# Patient Record
Sex: Female | Born: 1950 | Race: Black or African American | Hispanic: No | Marital: Married | Smoking: Never smoker
Health system: Southern US, Community
[De-identification: ages and names within clinical notes are randomized; demographics above are authoritative.]

## PROBLEM LIST (undated history)

## (undated) DIAGNOSIS — E7800 Pure hypercholesterolemia, unspecified: Secondary | ICD-10-CM

## (undated) DIAGNOSIS — D631 Anemia in chronic kidney disease: Secondary | ICD-10-CM

## (undated) DIAGNOSIS — D869 Sarcoidosis, unspecified: Secondary | ICD-10-CM

## (undated) DIAGNOSIS — D509 Iron deficiency anemia, unspecified: Secondary | ICD-10-CM

## (undated) DIAGNOSIS — I10 Essential (primary) hypertension: Secondary | ICD-10-CM

## (undated) DIAGNOSIS — N183 Chronic kidney disease, stage 3 (moderate): Secondary | ICD-10-CM

## (undated) DIAGNOSIS — E119 Type 2 diabetes mellitus without complications: Secondary | ICD-10-CM

## (undated) HISTORY — PX: ABDOMINAL HYSTERECTOMY: SHX81

## (undated) HISTORY — DX: Iron deficiency anemia, unspecified: D50.9

## (undated) HISTORY — DX: Chronic kidney disease, stage 3 (moderate): N18.3

## (undated) HISTORY — DX: Anemia in chronic kidney disease: D63.1

---

## 1997-11-16 ENCOUNTER — Emergency Department (HOSPITAL_COMMUNITY): Admission: EM | Admit: 1997-11-16 | Discharge: 1997-11-16 | Payer: Self-pay | Admitting: Internal Medicine

## 1998-02-25 ENCOUNTER — Ambulatory Visit (HOSPITAL_COMMUNITY): Admission: RE | Admit: 1998-02-25 | Discharge: 1998-02-25 | Payer: Self-pay | Admitting: Internal Medicine

## 1998-02-25 ENCOUNTER — Encounter: Payer: Self-pay | Admitting: Internal Medicine

## 1998-03-07 ENCOUNTER — Ambulatory Visit (HOSPITAL_COMMUNITY): Admission: RE | Admit: 1998-03-07 | Discharge: 1998-03-07 | Payer: Self-pay | Admitting: Internal Medicine

## 1998-03-08 ENCOUNTER — Inpatient Hospital Stay (HOSPITAL_COMMUNITY): Admission: AD | Admit: 1998-03-08 | Discharge: 1998-03-11 | Payer: Self-pay | Admitting: Internal Medicine

## 1998-03-09 ENCOUNTER — Encounter: Payer: Self-pay | Admitting: Internal Medicine

## 1998-03-10 ENCOUNTER — Encounter: Payer: Self-pay | Admitting: General Surgery

## 1998-03-11 ENCOUNTER — Encounter: Payer: Self-pay | Admitting: General Surgery

## 1998-06-30 ENCOUNTER — Other Ambulatory Visit: Admission: RE | Admit: 1998-06-30 | Discharge: 1998-06-30 | Payer: Self-pay | Admitting: Internal Medicine

## 1999-06-21 ENCOUNTER — Other Ambulatory Visit: Admission: RE | Admit: 1999-06-21 | Discharge: 1999-06-21 | Payer: Self-pay | Admitting: Internal Medicine

## 2000-08-23 ENCOUNTER — Other Ambulatory Visit: Admission: RE | Admit: 2000-08-23 | Discharge: 2000-08-23 | Payer: Self-pay | Admitting: Internal Medicine

## 2000-12-13 ENCOUNTER — Other Ambulatory Visit: Admission: RE | Admit: 2000-12-13 | Discharge: 2000-12-13 | Payer: Self-pay | Admitting: *Deleted

## 2000-12-13 ENCOUNTER — Encounter (INDEPENDENT_AMBULATORY_CARE_PROVIDER_SITE_OTHER): Payer: Self-pay | Admitting: Specialist

## 2001-01-03 ENCOUNTER — Ambulatory Visit (HOSPITAL_COMMUNITY): Admission: RE | Admit: 2001-01-03 | Discharge: 2001-01-03 | Payer: Self-pay | Admitting: Gastroenterology

## 2001-01-22 ENCOUNTER — Encounter (INDEPENDENT_AMBULATORY_CARE_PROVIDER_SITE_OTHER): Payer: Self-pay | Admitting: Specialist

## 2001-01-22 ENCOUNTER — Observation Stay (HOSPITAL_COMMUNITY): Admission: RE | Admit: 2001-01-22 | Discharge: 2001-01-23 | Payer: Self-pay | Admitting: *Deleted

## 2003-05-10 ENCOUNTER — Other Ambulatory Visit: Admission: RE | Admit: 2003-05-10 | Discharge: 2003-05-10 | Payer: Self-pay | Admitting: *Deleted

## 2003-08-11 ENCOUNTER — Encounter: Admission: RE | Admit: 2003-08-11 | Discharge: 2003-08-11 | Payer: Self-pay | Admitting: Internal Medicine

## 2007-02-19 ENCOUNTER — Other Ambulatory Visit: Admission: RE | Admit: 2007-02-19 | Discharge: 2007-02-19 | Payer: Self-pay | Admitting: *Deleted

## 2008-10-06 ENCOUNTER — Observation Stay (HOSPITAL_COMMUNITY): Admission: EM | Admit: 2008-10-06 | Discharge: 2008-10-07 | Payer: Self-pay | Admitting: Emergency Medicine

## 2010-07-17 LAB — URINALYSIS, ROUTINE W REFLEX MICROSCOPIC
Bilirubin Urine: NEGATIVE
Glucose, UA: NEGATIVE mg/dL
Hgb urine dipstick: NEGATIVE
Ketones, ur: NEGATIVE mg/dL
Nitrite: NEGATIVE
Protein, ur: NEGATIVE mg/dL
Specific Gravity, Urine: 1.023 (ref 1.005–1.030)
Urobilinogen, UA: 0.2 mg/dL (ref 0.0–1.0)
pH: 6.5 (ref 5.0–8.0)

## 2010-07-17 LAB — POCT I-STAT, CHEM 8
BUN: 14 mg/dL (ref 6–23)
Calcium, Ion: 1.05 mmol/L — ABNORMAL LOW (ref 1.12–1.32)
Chloride: 102 mEq/L (ref 96–112)
Creatinine, Ser: 0.7 mg/dL (ref 0.4–1.2)
Glucose, Bld: 163 mg/dL — ABNORMAL HIGH (ref 70–99)
HCT: 34 % — ABNORMAL LOW (ref 36.0–46.0)
Hemoglobin: 11.6 g/dL — ABNORMAL LOW (ref 12.0–15.0)
Potassium: 3.8 mEq/L (ref 3.5–5.1)
Sodium: 138 mEq/L (ref 135–145)
TCO2: 23 mmol/L (ref 0–100)

## 2010-07-17 LAB — PROTIME-INR
INR: 1.1 (ref 0.00–1.49)
Prothrombin Time: 14.2 seconds (ref 11.6–15.2)

## 2010-07-17 LAB — D-DIMER, QUANTITATIVE: D-Dimer, Quant: 0.55 ug/mL-FEU — ABNORMAL HIGH (ref 0.00–0.48)

## 2010-08-25 NOTE — Discharge Summary (Signed)
[facility]  Patient:    Jeanette Ellis, Jeanette Ellis Visit Number: [id number] MRN: [id number]          Service Type: SUR Location: 4W 0451 01 Attending Physician:  Collene Schlichter Dictated by:   Almedia Balls. Randell Patient, M.D. Admit Date:  01/22/2001 Discharge Date: 01/23/2001                             Discharge Summary  HISTORY OF PRESENT ILLNESS:  The patient is a 60 year old with uterine enlargement secondary to fibroids, abnormal uterine bleeding, anemia secondary to above, who was admitted on January 22, 2001, for abdominal hysterectomy, bilateral salpingo-oophorectomy.  The remainder of her history and physical examination are as previously dictated.  LABORATORY DATA:  Preoperative hemoglobin 11.1, glucose 278.  Chest x-ray was normal.  Electrocardiogram showed nonspecific T wave segment changes.  HOSPITAL COURSE:  The patient was taken to the operating room on January 22, 2001, at which time abdominal supracervical hysterectomy, bilateral salpingo-oophorectomy were performed.  The patient did well postoperatively. She was managed for her glucose by Dr. Lelon Perla, and maintained glucose levels in the approximately mid 180s.  On the morning of January 23, 2001, she was afebrile and experiencing no problems except for pain.  It was felt that she could be discharged at this time.  FINAL DIAGNOSES: 1. Uterine fibroids. 2. Abnormal uterine bleeding. 3. Anemia secondary to above. 4. Bilateral ovarian cysts. 5. Pelvic adhesions.  OPERATIONS:  Abdominal supracervical hysterectomy, bilateral salpingo-oophorectomy, lysis of adhesions.  Pathology report was unavailable at the time of dictation.  DISPOSITION:  Discharged home to return to the office in approximately two weeks for followup.  ACTIVITY:  She was instructed to gradually progress her activities over several weeks at home, and to limit lifting and driving for two weeks.  CONDITION ON DISCHARGE:   She was fully ambulatory, on a regular diet, and in good condition at the time of discharge.  DISCHARGE MEDICATIONS: 1. Mepergan Fortis generic #30 one or two q.4h. p.r.n. pain. 2. Doxycycline 100 mg #12 one b.i.d.  She will call for any problems. Dictated by:   Almedia Balls Randell Patient, M.D. Attending Physician:  Collene Schlichter DD:  01/25/01 TD:  01/27/01 Job: [id number]

## 2010-08-25 NOTE — H&P (Signed)
[facility]  Patient:    Jeanette Ellis, Jeanette Ellis Visit Number: [id number] MRN: [id number]          Service Type: END Location: ENDO Attending Physician:  Charna Elizabeth Dictated by:   Almedia Balls. Randell Patient, M.D. Admit Date:  01/03/2001 Discharge Date: 01/03/2001                           History and Physical  CHIEF COMPLAINT:  Fibroids, abnormal uterine bleeding.  HISTORY:  Patient is a 60 year old gravida 4, para 4 whose last menstrual period was in August of 2002.  She was seen in our office late August on referral from Dr. Anselmo Rod.  Patient was evaluated and found to have uterine enlargement to approximately [redacted] weeks gestational size and under sonogram with findings of this same nature.  She was also found to have an increase in her endometrial stripe and underwent hysteroscopy/D&C on December 13, 2000, with findings of disordered proliferative endometrium and endometrial polyp with simple hyperplasia.  Pap smear had been done earlier in the summer, which was within normal limits.  Patient is admitted at this time for abdominal hysterectomy/bilateral salpingo-oophorectomy.  She has been fully counseled as to the nature of the procedure and the risks involved to include risks of anesthesia, injury to bowel, bladder, blood vessels, ureters, postoperative hemorrhage, infection and recuperation as well as hormone replacement.  She fully understands all the considerations and wishes to proceed on January 22, 2001.  PAST MEDICAL HISTORY:  She has had cholecystectomy in 1999.  She has type 2 diabetes mellitus and is on oral medications with intermittent good control.  MEDICATIONS:  She takes only Glucophage for her diabetes.  ALLERGIES:  Allergic to no medications.  FAMILY HISTORY:  Father with diabetes mellitus, mother with cardiovascular disease.  REVIEW OF SYSTEMS:  HEENT:  Negative except for glasses.  CARDIORESPIRATORY: Negative.  GASTROINTESTINAL:   As noted above.  GENITOURINARY:  As noted above. NEUROMUSCULAR:  Negative.    PHYSICAL EXAMINATION:  VITAL SIGNS:  Height 5 feet 8-1/4 inches.  Weight 219 pounds.  Blood pressure 142/86, pulse 72, respirations 18.  GENERAL:  Well-developed black female in no acute distress.  HEENT:  Within normal limits.  NECK:  Supple without masses, adenopathy or bruits.  HEART:  Regular rate and rhythm without murmurs.  LUNGS:  Clear to P&A.  BREASTS:  Examined sitting and lying, without masses.  Axillae negative.  ABDOMEN:  Flat and soft with mass effect to just below the umbilicus, somewhat tender.  No other palpable masses.  PELVIC:  External genitalia, Bartholins, urethra and Skenes glands within normal limits.  Cervix slightly inflamed.  Uterus antero-mid in position, approximately [redacted] weeks gestational size, irregular.  It is not possible to palpate the adnexa, although these did appear normal on sonogram. Rectovaginal exam is confirmatory.  EXTREMITIES:  Within normal limits.  CENTRAL NERVOUS SYSTEM:  Grossly intact.  SKIN:  Without suspicious lesions.  IMPRESSION: 1. Leiomyomata uteri with abnormal uterine bleeding, pain and anemia secondary    to above. 2. Type 2 diabetes mellitus with moderate control.  DISPOSITION:  As noted above. Dictated by:   Almedia Balls Randell Patient, M.D. Attending Physician:  Charna Elizabeth DD:  01/20/01 TD:  01/20/01 Job: [id number]

## 2010-08-25 NOTE — Procedures (Signed)
[place]  Patient:    Jeanette Ellis, Jeanette Ellis Visit Number: [id number] MRN: [id number]          Service Type: END Location: ENDO Attending Physician:  Charna Elizabeth Dictated by:   Anselmo Rod, M.D. Proc. Date: 01/03/01 Admit Date:  01/03/2001   CC:         Cala Bradford R. Renae Gloss, M.D.   Procedure Report  DATE OF BIRTH:  1950-11-03  REFERRING PHYSICIAN:  Merlene Laughter. Renae Gloss, M.D.  PROCEDURE PERFORMED:  Flexible sigmoidoscopy.  ENDOSCOPIST:  Anselmo Rod, M.D.  INSTRUMENT USED:  Olympus video colonoscope.  INDICATIONS FOR PROCEDURE:  Screening flexible sigmoidoscopy is being performed in a 60 year old African-American female rule out colonic polyps, masses, etc.  PREPROCEDURE PREPARATION:  Informed consent was procured from the patient. The patient was fasted for eight hours prior to the procedure and prepped with a bottle of magnesium citrate and a gallon of NuLytely the night prior to the procedure.  PREPROCEDURE PHYSICAL:  The patient had stable vital signs.  Neck supple. Chest clear to auscultation.  S1, S2 regular.  Abdomen soft with normal abdominal bowel sounds.  DESCRIPTION OF PROCEDURE:  The patient was placed in the left lateral decubitus position and sedated with 50 mg of Demerol and 5 mg of Versed intravenously.  Once the patient was adequately sedated and maintained on low-flow oxygen and continuous cardiac monitoring, the Olympus video colonoscope was advanced from the rectum to 90 cm without difficulty.  Except for a few left-sided diverticula, no other abnormalities were seen.  The diverticula were small, in the early stages of formation.  No masses or polyps were recognized.  There was no evidence of hemorrhoids.  IMPRESSION: 1. Early left-sided diverticulosis. 2. No masses or polyps seen. 3. Healthy-appearing colon up to 90 cm except for the diverticulosis.  RECOMMENDATIONS: 1. A high fiber diet has been  discussed with the patient in great detail.    Brochures have been to her for her education. 2. Repeat colorectal cancer screening has been recommended in the next five    years unless the patient were to develop any abnormal symptoms in the    interim. Dictated by:   Anselmo Rod, M.D. Attending Physician:  Charna Elizabeth DD:  01/03/01 TD:  01/03/01 Job: [id number]

## 2010-08-25 NOTE — Op Note (Signed)
[facility]  Patient:    Jeanette Ellis, Jeanette Ellis Visit Number: [id number] MRN: [id number]          Service Type: SUR Location: 4W 0451 01 Attending Physician:  Collene Schlichter Dictated by:   Almedia Balls. Randell Patient, M.D. Proc. Date: 01/22/01 Admit Date:  01/22/2001   CC:         Harl Bowie, M.D.   Operative Report  PREOPERATIVE DIAGNOSES: 1. Abnormal uterine bleeding. 2. Uterine fibroids. 3. Pelvic pain.  POSTOPERATIVE DIAGNOSES: 1. Abnormal uterine bleeding. 2. Uterine fibroids. 3. Pelvic pain. 4. Apparent simple ovarian cyst and pelvic adhesions, pending pathology.  OPERATION:  Abdominal supracervical hysterectomy, bilateral salpingo-oophorectomy, and lysis of adhesions.  ANESTHESIA:  General orotracheal.  SURGEON:  Almedia Balls. Randell Patient, M.D.  FIRST ASSISTANT:  Harl Bowie, M.D.  INDICATIONS FOR SURGERY:  The patient is a 60 year old with the above noted problems.  She was counseled as to the need for surgery to treat these problems.  She was fully counseled as to the nature of the procedure and the risks involved including the risks of anesthesia, injury to bowel or bladder, blood vessels, ureters, postoperative hemorrhage, infection, recuperation, and possible use of hormone replacement.  She fully understands all these considerations and wishes to proceed on January 22, 2001.  OPERATIVE FINDINGS:  On entry into the abdomen, there were multiple adhesions at the site of the previous laparoscopic cholecystectomy in the upper abdomen. The lower liver edge was not palpated well because of this.  Spleen appeared to be normal as did the kidneys, periaortic areas, and the appendix.  The uterus was mid posterior with multiple myomata present and approximately 12-[redacted] weeks gestational size.  There was simple cysts on both ovaries with a corpus luteum on the right ovary.  There were multiple dense adhesions involving the adnexal structures in the  posterior cul-de-sac to the descending rectosigmoid.  DESCRIPTION OF PROCEDURE:  With the patient under general anesthesia, prepared and draped in the usual sterile fashion, with a Foley catheter in the bladder, a lower abdominal transverse incision was made, and carried into the peritoneal cavity without difficulty.  A self-retaining retractor was placed and the bowel was packed off.  Kelly clamps were used to clamp the utero-ovarian anastomoses, tubes, and round ligaments bilaterally for traction hemostasis.  The round ligaments were then transected using Bovie electrocoagulation with development of the bladder flap anteriorly and entry into the retroperitoneal space posteriorly.  The infundibulopelvic ligaments were identified bilaterally, well above the ureters, clamped, cut, and doubly ligated with 1 chromic catgut.  The uterine vessels bilaterally were then skeletonized, clamped, cut, and suture ligated with 1 chromic catgut. Cardinal ligaments were likewise clamped, cut, and suture ligated with 1 chromic catgut.  The uterine fundus was then excised using Bovie electrocoagulation off the remaining cervical stump.  The endocervix was then treated with Bovie electrocoagulation for hemostasis and prevention of postoperative leukorrhea.  The cervical stump was reapproximated in rendered hemostatic with interrupted figure-of-eight sutures of 1 chromic catgut.  The area was lavaged with copious amounts of lactated Ringers solution, and after noting that hemostasis was maintained, and that sponge and instrument counts were correct, the peritoneum was closed with a continuous suture of 0 Vicryl. The fascia was closed with two sutures of 0 Vicryl which were brought from the lateral aspects of the incision and tied separately in the midline.  The subcutaneous fat was closed with interrupted sutures of 1 chromic catgut.  The skin was closed  with a subcuticular suture of 3-0 plain catgut.  The  estimated blood loss was 200 ml.  The patient was taken to the recovery room in good condition with clear urine in the Foley catheter tubing.  She will be placed on 23-hour observation following surgery. Dictated by:   Almedia Balls Randell Patient, M.D. Attending Physician:  Collene Schlichter DD:  01/22/01 TD:  01/23/01 Job: [id number]

## 2011-07-30 ENCOUNTER — Other Ambulatory Visit: Payer: Self-pay | Admitting: Internal Medicine

## 2011-07-30 ENCOUNTER — Ambulatory Visit
Admission: RE | Admit: 2011-07-30 | Discharge: 2011-07-30 | Disposition: A | Discharge status: Home / Self Care | Payer: BC Managed Care – PPO | Source: Ambulatory Visit | Attending: Internal Medicine | Admitting: Internal Medicine

## 2011-07-30 DIAGNOSIS — R0602 Shortness of breath: Secondary | ICD-10-CM

## 2013-02-09 ENCOUNTER — Ambulatory Visit: Payer: Self-pay | Admitting: Podiatry

## 2013-02-23 ENCOUNTER — Encounter: Payer: Self-pay | Admitting: Podiatry

## 2013-02-23 ENCOUNTER — Ambulatory Visit (INDEPENDENT_AMBULATORY_CARE_PROVIDER_SITE_OTHER): Payer: BC Managed Care – PPO

## 2013-02-23 ENCOUNTER — Ambulatory Visit (INDEPENDENT_AMBULATORY_CARE_PROVIDER_SITE_OTHER): Payer: BC Managed Care – PPO | Admitting: Podiatry

## 2013-02-23 VITALS — BP 136/56 | HR 80 | Resp 12 | Ht 70.0 in | Wt 200.0 lb

## 2013-02-23 DIAGNOSIS — R52 Pain, unspecified: Secondary | ICD-10-CM

## 2013-02-23 NOTE — Progress Notes (Signed)
N-SORE, NUMBNESS L-B/L FOOT D-2 YEARS O-SLOWLY C-SAME A-WALKING, SITTING T-N/A

## 2013-02-24 NOTE — Progress Notes (Signed)
Subjective:     Patient ID: Jeanette Ellis, female   DOB: 04/02/1951, 62 y.o.   MRN: [id number]  Diabetes   patient presents stating I'm a diabetic and I have been getting some increased numbness in my feet over the last several years. States she is doing a better job of keeping her sugar under control but it used to be quite high   Review of Systems  All other systems reviewed and are negative.       Objective:   Physical Exam  Constitutional: She is oriented to person, place, and time.  Cardiovascular: Intact distal pulses.   Musculoskeletal: Normal range of motion.  Neurological: She is oriented to person, place, and time.  Skin: Skin is warm.   patient is found to have mild loss of Dtr reflexes and vibratory sensation. Simes Blaine Hamper is intact but diminished. 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

## 2013-03-24 ENCOUNTER — Ambulatory Visit: Payer: BC Managed Care – PPO | Admitting: *Deleted

## 2013-04-28 ENCOUNTER — Ambulatory Visit: Payer: BC Managed Care – PPO | Admitting: *Deleted

## 2013-07-15 ENCOUNTER — Other Ambulatory Visit: Payer: Self-pay | Admitting: Internal Medicine

## 2013-07-15 DIAGNOSIS — Z1231 Encounter for screening mammogram for malignant neoplasm of breast: Secondary | ICD-10-CM

## 2013-09-21 DIAGNOSIS — E785 Hyperlipidemia, unspecified: Secondary | ICD-10-CM | POA: Insufficient documentation

## 2013-09-21 DIAGNOSIS — I10 Essential (primary) hypertension: Secondary | ICD-10-CM | POA: Insufficient documentation

## 2014-01-12 DIAGNOSIS — E119 Type 2 diabetes mellitus without complications: Secondary | ICD-10-CM | POA: Insufficient documentation

## 2014-01-12 DIAGNOSIS — Z794 Long term (current) use of insulin: Principal | ICD-10-CM

## 2014-06-07 DIAGNOSIS — Z23 Encounter for immunization: Secondary | ICD-10-CM | POA: Insufficient documentation

## 2014-06-07 DIAGNOSIS — R350 Frequency of micturition: Secondary | ICD-10-CM | POA: Insufficient documentation

## 2015-06-09 DIAGNOSIS — Z862 Personal history of diseases of the blood and blood-forming organs and certain disorders involving the immune mechanism: Secondary | ICD-10-CM | POA: Insufficient documentation

## 2015-09-08 DIAGNOSIS — R5383 Other fatigue: Secondary | ICD-10-CM | POA: Insufficient documentation

## 2016-02-20 DIAGNOSIS — R3129 Other microscopic hematuria: Secondary | ICD-10-CM | POA: Insufficient documentation

## 2016-09-22 ENCOUNTER — Ambulatory Visit (HOSPITAL_COMMUNITY)
Admission: EM | Admit: 2016-09-22 | Discharge: 2016-09-22 | Disposition: A | Discharge status: Home / Self Care | Payer: Medicare HMO | Attending: Family Medicine | Admitting: Family Medicine

## 2016-09-22 ENCOUNTER — Encounter (HOSPITAL_COMMUNITY): Payer: Self-pay

## 2016-09-22 DIAGNOSIS — K0889 Other specified disorders of teeth and supporting structures: Secondary | ICD-10-CM | POA: Diagnosis not present

## 2016-09-22 HISTORY — DX: Type 2 diabetes mellitus without complications: E11.9

## 2016-09-22 HISTORY — DX: Essential (primary) hypertension: I10

## 2016-09-22 HISTORY — DX: Pure hypercholesterolemia, unspecified: E78.00

## 2016-09-22 HISTORY — DX: Sarcoidosis, unspecified: D86.9

## 2016-09-22 MED ORDER — AMOXICILLIN-POT CLAVULANATE 875-125 MG PO TABS: 1.0000 | ORAL_TABLET | Freq: Two times a day (BID) | ORAL | 0 refills | Status: DC

## 2016-09-22 MED ORDER — HYDROCODONE-ACETAMINOPHEN 5-325 MG PO TABS: 1.0000 | ORAL_TABLET | Freq: Four times a day (QID) | ORAL | 0 refills | Status: DC | PRN

## 2016-09-22 NOTE — ED Provider Notes (Signed)
[id number]  09/22/16 1229  ASSESSMENT & PLAN:  Final diagnoses:  Pain, dental   Question of early abscess vs sialoadenitis. Discussed. Will empirically treat. Meds below.  She reports scheduled dental f/u early next week. May f/u here or ED sooner if worsening.  Meds ordered this encounter  Medications  .       . amoxicillin-clavulanate (AUGMENTIN) 875-125 MG tablet    Sig: Take 1 tablet by mouth every 12 (twelve) hours.    Dispense:  14 tablet    Refill:  0  . HYDROcodone-acetaminophen (NORCO/VICODIN) 5-325 MG tablet    Sig: Take 1 tablet by mouth every 6 (six) hours as needed for moderate pain or severe pain.    Dispense:  12 tablet    Refill:  0    Notice elevated BP. Has not taken BP meds today.   Outlined signs and symptoms indicating need for more acute intervention.  Patient verbalized understanding. Questions answered.  After Visit Summary given.   SUBJECTIVE:  Jeanette Ellis is a 66 y.o. female who presents with complaint of right-sided dental pain. 3 days. Constant. Decreased PO intake secondary to pain. No neck pain or swelling. Afebrile. OTC analgesics without relief. No swallowing or breathing difficulties. Diabetic.  ROS: As per HPI.  OBJECTIVE:  Vitals:   09/22/16 1331  BP: (!) 154/87  Pulse: 97  Resp: 20  Temp: 99.7 F (37.6 C)  TempSrc: Oral  SpO2: 98%     BP (!) 154/87 (BP Location: Right Arm)   Pulse 97   Temp 99.7 F (37.6 C) (Oral)   Resp 20   SpO2 98%   General appearance: alert and no distress Head: Normocephalic, without obvious abnormality, atraumatic Throat: Tender over R lower gum and cheek. No fluctuance appreciated. Tongue normal. Neck: no adenopathy and supple, symmetrical, trachea midline Lungs: clear to auscultation bilaterally Skin: Skin color, texture, turgor normal. No rashes or lesions  Labs Reviewed - No data to display  No Known Allergies  PMHx, SurgHx, SocialHx, Medications, and Allergies were reviewed  in the Visit Navigator and updated as appropriate.   Prior to Admission medications   Medication Sig Start Date End Date Taking? Authorizing Provider  Canagliflozin-Metformin HCl (INVOKAMET PO) Take by mouth.   Yes [provider]  lisinopril (PRINIVIL,ZESTRIL) 10 MG tablet  01/30/13  Yes [provider]  losartan (COZAAR) 50 MG tablet  11/27/12  Yes [provider]  amoxicillin-clavulanate (AUGMENTIN) 875-125 MG tablet Take 1 tablet by mouth every 12 (twelve) hours. 09/22/16   Mardella LaymanHagler, Micky Sheller, MD  HYDROcodone-acetaminophen (NORCO/VICODIN) 5-325 MG tablet Take 1 tablet by mouth every 6 (six) hours as needed for moderate pain or severe pain. 09/22/16   Mardella LaymanHagler, Broden Holt, MD  metFORMIN (GLUCOPHAGE) 1000 MG tablet  02/17/13   [provider]         Mardella LaymanHagler, Eshawn Coor, MD 09/22/16 [phone]

## 2016-09-22 NOTE — ED Triage Notes (Signed)
Pt having tooth pain on the right side for 3 days. It is visibly swollen and trouble eating and drinking. Took ibuprofen last night and yesterday without relief.

## 2016-11-28 ENCOUNTER — Ambulatory Visit (INDEPENDENT_AMBULATORY_CARE_PROVIDER_SITE_OTHER): Payer: Medicare HMO | Admitting: Physician Assistant

## 2016-11-28 ENCOUNTER — Encounter: Payer: Self-pay | Admitting: Physician Assistant

## 2016-11-28 VITALS — BP 130/69 | HR 85 | Temp 97.4°F | Resp 16 | Ht 69.0 in | Wt 193.0 lb

## 2016-11-28 DIAGNOSIS — E119 Type 2 diabetes mellitus without complications: Secondary | ICD-10-CM | POA: Diagnosis not present

## 2016-11-28 DIAGNOSIS — Z794 Long term (current) use of insulin: Secondary | ICD-10-CM

## 2016-11-28 LAB — POCT URINALYSIS DIP (MANUAL ENTRY)
Bilirubin, UA: NEGATIVE
Glucose, UA: 500 mg/dL — AB
Ketones, POC UA: NEGATIVE mg/dL
Leukocytes, UA: NEGATIVE
Nitrite, UA: NEGATIVE
Protein Ur, POC: 100 mg/dL — AB
Spec Grav, UA: 1.025 (ref 1.010–1.025)
Urobilinogen, UA: 0.2 E.U./dL
pH, UA: 5 (ref 5.0–8.0)

## 2016-11-28 LAB — GLUCOSE, POCT (MANUAL RESULT ENTRY): POC Glucose: 174 mg/dl — AB (ref 70–99)

## 2016-11-28 LAB — POCT GLYCOSYLATED HEMOGLOBIN (HGB A1C): Hemoglobin A1C: 8.3

## 2016-11-28 MED ORDER — INSULIN GLARGINE 300 UNIT/ML ~~LOC~~ SOPN: 25.0000 [IU] | PEN_INJECTOR | Freq: Every day | SUBCUTANEOUS | 0 refills | Status: DC

## 2016-11-28 MED ORDER — "INSULIN SYRINGE 31G X 5/16"" 0.3 ML MISC": 1 refills | Status: DC

## 2016-11-28 MED ORDER — INVOKAMET 150-1000 MG PO TABS: 1.0000 | ORAL_TABLET | Freq: Two times a day (BID) | ORAL | 0 refills | Status: DC

## 2016-11-28 NOTE — Progress Notes (Signed)
MRN: [id number]  Subjective:   Jeanette Ellis is a 66 y.o. female who presents for follow up of Type 2 diabetes mellitus. PCP was Dr. Luciana Axe but she wants to change providers. She ran out of toujeo and invokamet 3 days ago. She has been taking metformin for the past 3 days as she had left over refills for that. Last follow up with PCP for T2DM was 6 months ago, she thinks her A1C was 9.0.  Diagnosis was made for 30 years. Patient is currently managed with toujeo 26 units and invokamet 150-'1000mg'$ . Admits fair compliance. Denies adverse effects including metallic taste, hypoglycemia, nausea, vomiting.Marland Kitchen Has been on insulin for 1-2 years.   Patient is checking home blood sugars. Home blood sugar records: BGs range between 130 and 150. Current symptoms include none. Patient denies foot ulcerations, nausea, paresthesia of the feet, polydipsia, polyuria, visual disturbances, vomiting and weight loss. Patient is not checking their feet daily. No foot concerns. Last diabetic eye exam eye exam was either last year or this year.   Diet consists of limiting carbs and sodas. Before she ran out of medication she was not eating a good diet. She has never seen a diabetic nutritionist.  Exercise includes walking. Denies smoking or alcohol use.   Known diabetic complications: none  Immunizations: pneumococal vaccine: thinks she had it last year  Social History   Social History  . Marital status: Married    Spouse name: N/A  . Number of children: N/A  . Years of education: N/A   Occupational History  . Not on file.   Social History Main Topics  . Smoking status: Never Smoker  . Smokeless tobacco: Never Used  . Alcohol use No  . Drug use: No  . Sexual activity: Not on file   Other Topics Concern  . Not on file   Social History Narrative  . No narrative on file      Objective:   PHYSICAL EXAM BP 130/69 (BP Location: Right Arm, Patient Position: Sitting, Cuff Size: Normal)   Pulse 85    Temp (!) 97.4 F (36.3 C) (Oral)   Resp 16   Ht '5\' 9"'$  (1.753 m)   Wt 193 lb (87.5 kg)   SpO2 97%   BMI 28.50 kg/m   Physical Exam  Constitutional: She is oriented to person, place, and time. She appears well-developed and well-nourished.  HENT:  Head: Normocephalic and atraumatic.  Eyes: Conjunctivae are normal.  Neck: Normal range of motion.  Cardiovascular: Normal rate, regular rhythm, normal heart sounds and intact distal pulses.   Pulmonary/Chest: Effort normal and breath sounds normal.  Abdominal: Soft. Bowel sounds are normal.  Neurological: She is alert and oriented to person, place, and time.  Skin: Skin is warm and dry.  Psychiatric: She has a normal mood and affect.  Vitals reviewed.   Diabetic Foot Exam - Simple   Simple Foot Form Visual Inspection No deformities, no ulcerations, no other skin breakdown bilaterally:  Yes Sensation Testing Intact to touch and monofilament testing bilaterally:  Yes See comments:  Yes Pulse Check Comments     Results for orders placed or performed in visit on 11/28/16 (from the past 24 hour(s))  POCT urinalysis dipstick     Status: Abnormal   Collection Time: 11/28/16  2:32 PM  Result Value Ref Range   Color, UA yellow yellow   Clarity, UA clear clear   Glucose, UA =500 (A) negative mg/dL   Bilirubin, UA negative negative  Ketones, POC UA negative negative mg/dL   Spec Grav, UA 5.291 9.777 - 1.025   Blood, UA trace-lysed (A) negative   pH, UA 5.0 5.0 - 8.0   Protein Ur, POC =100 (A) negative mg/dL   Urobilinogen, UA 0.2 0.2 or 1.0 E.U./dL   Nitrite, UA Negative Negative   Leukocytes, UA Negative Negative  POCT glucose (manual entry)     Status: Abnormal   Collection Time: 11/28/16  2:34 PM  Result Value Ref Range   POC Glucose 174 (A) 70 - 99 mg/dl  POCT glycosylated hemoglobin (Hb A1C)     Status: Abnormal   Collection Time: 11/28/16  2:38 PM  Result Value Ref Range   Hemoglobin A1C 8.3   CBC with  Differential/Platelet     Status: Abnormal   Collection Time: 11/28/16  3:02 PM  Result Value Ref Range   WBC 5.1 3.4 - 10.8 x10E3/uL   RBC 4.04 3.77 - 5.28 x10E6/uL   Hemoglobin 10.7 (L) 11.1 - 15.9 g/dL   Hematocrit 15.4 (L) 29.8 - 46.6 %   MCV 83 79 - 97 fL   MCH 26.5 (L) 26.6 - 33.0 pg   MCHC 31.8 31.5 - 35.7 g/dL   RDW 59.0 34.1 - 88.8 %   Platelets 361 150 - 379 x10E3/uL   Neutrophils 61 Not Estab. %   Lymphs 25 Not Estab. %   Monocytes 7 Not Estab. %   Eos 6 Not Estab. %   Basos 0 Not Estab. %   Neutrophils Absolute 3.1 1.4 - 7.0 x10E3/uL   Lymphocytes Absolute 1.3 0.7 - 3.1 x10E3/uL   Monocytes Absolute 0.4 0.1 - 0.9 x10E3/uL   EOS (ABSOLUTE) 0.3 0.0 - 0.4 x10E3/uL   Basophils Absolute 0.0 0.0 - 0.2 x10E3/uL   Immature Granulocytes 1 Not Estab. %   Immature Grans (Abs) 0.0 0.0 - 0.1 x10E3/uL   Narrative   Performed at:  [address] [place], [place]  [id number] Lab Director: Mila Homer MD, Phone:  [phone]  CMP14+EGFR     Status: Abnormal   Collection Time: 11/28/16  3:02 PM  Result Value Ref Range   Glucose 170 (H) 65 - 99 mg/dL   BUN 24 8 - 27 mg/dL   Creatinine, Ser 4.76 0.57 - 1.00 mg/dL   GFR calc non Af Amer 61 >59 mL/min/1.73   GFR calc Af Amer 70 >59 mL/min/1.73   BUN/Creatinine Ratio 25 12 - 28   Sodium 142 134 - 144 mmol/L   Potassium 5.0 3.5 - 5.2 mmol/L   Chloride 101 96 - 106 mmol/L   CO2 23 20 - 29 mmol/L   Calcium 10.2 8.7 - 10.3 mg/dL   Total Protein 7.9 6.0 - 8.5 g/dL   Albumin 4.0 3.6 - 4.8 g/dL   Globulin, Total 3.9 1.5 - 4.5 g/dL   Albumin/Globulin Ratio 1.0 (L) 1.2 - 2.2   Bilirubin Total 0.3 0.0 - 1.2 mg/dL   Alkaline Phosphatase 85 39 - 117 IU/L   AST 18 0 - 40 IU/L   ALT 11 0 - 32 IU/L   Narrative   Performed at:  [address] [place], [place]  [id number] Lab Director: Mila Homer MD, Phone:  [phone]  Microalbumin/Creatinine Ratio, Urine     Status: Abnormal    Collection Time: 11/28/16  3:02 PM  Result Value Ref Range   Creatinine, Urine 123.9 Not Estab. mg/dL   Albumin, Urine 963.8 Not Estab.  ug/mL   Microalb/Creat Ratio 195.1 (H) 0.0 - 30.0 mg/g creat   Narrative   Performed at:  [address], [place], [place]  [id number] Lab Director: Lindon Romp MD, Phone:  [phone]    Assessment and Plan :  1. Type 2 diabetes mellitus without complication, with long-term current use of insulin (HCC) Uncontrolled at this time. A1c of 8.3. We will continue with current dose of insulin and invokamet. Fasting blood sugar goal of 70-100. Patient educated on hypoglycemia and encouraged to contact our office or seek care immediately if she experiences a blood sugar reading of below 70. Patient educated on lifestyle modifications. Referral placed for diabetic education. Labs pending. Plan to follow-up in 3 months for reevaluation. Encouraged to obtain medical records from prior PCP office so we can update health maintenance in our system. - CBC with Differential/Platelet - CMP14+EGFR - POCT glucose (manual entry) - POCT urinalysis dipstick - POCT glycosylated hemoglobin (Hb A1C) - Ambulatory referral to diabetic education - Microalbumin/Creatinine Ratio, Urine - INVOKAMET [phone] MG TABS; Take 1 tablet by mouth 2 (two) times daily.  Dispense: 180 tablet; Refill: 0 - Insulin Syringe-Needle U-100 (INSULIN SYRINGE .3CC/31GX5/16") 31G X 5/16" 0.3 ML MISC; Use with Toujeo pen once a day  Dispense: 100 each; Refill: 1 - Insulin Glargine (TOUJEO SOLOSTAR) 300 UNIT/ML SOPN; Inject 25 Units into the skin daily.  Dispense: 4.5 mL; Refill: 0  Tenna Delaine, PA-C  [facility] 11/29/2016 1:46 PM

## 2016-11-28 NOTE — Patient Instructions (Addendum)
Your A1C is 8.3,which has improved since your last A1C. I recommend really focusing on diet and exercise to help lower your A1C. I have given you some information below about diet and diabetes. I have also placed a referral for diabetic nutrition. They should contact you in the next 1-2 weeks. Continue checking your sugars throughout the day. Your fasting blood sugar gaol is 70-100. If your sugars ever drop below that please reduce your insulin dose. Please plan to follow up in office in the 3 months. Try to have your old PCP office fax over your records so we can update this in our system. Thank you for letting me participate in your health and well being.  Diabetes Mellitus and Food It is important for you to manage your blood sugar (glucose) level. Your blood glucose level can be greatly affected by what you eat. Eating healthier foods in the appropriate amounts throughout the day at about the same time each day will help you control your blood glucose level. It can also help slow or prevent worsening of your diabetes mellitus. Healthy eating may even help you improve the level of your blood pressure and reach or maintain a healthy weight. General recommendations for healthful eating and cooking habits include:  Eating meals and snacks regularly. Avoid going long periods of time without eating to lose weight.  Eating a diet that consists mainly of plant-based foods, such as fruits, vegetables, nuts, legumes, and whole grains.  Using low-heat cooking methods, such as baking, instead of high-heat cooking methods, such as deep frying.  Work with your dietitian to make sure you understand how to use the Nutrition Facts information on food labels. How can food affect me? Carbohydrates Carbohydrates affect your blood glucose level more than any other type of food. Your dietitian will help you determine how many carbohydrates to eat at each meal and teach you how to count carbohydrates. Counting  carbohydrates is important to keep your blood glucose at a healthy level, especially if you are using insulin or taking certain medicines for diabetes mellitus. Alcohol Alcohol can cause sudden decreases in blood glucose (hypoglycemia), especially if you use insulin or take certain medicines for diabetes mellitus. Hypoglycemia can be a life-threatening condition. Symptoms of hypoglycemia (sleepiness, dizziness, and disorientation) are similar to symptoms of having too much alcohol. If your health care provider has given you approval to drink alcohol, do so in moderation and use the following guidelines:  Women should not have more than one drink per day, and men should not have more than two drinks per day. One drink is equal to: ? 12 oz of beer. ? 5 oz of wine. ? 1 oz of hard liquor.  Do not drink on an empty stomach.  Keep yourself hydrated. Have water, diet soda, or unsweetened iced tea.  Regular soda, juice, and other mixers might contain a lot of carbohydrates and should be counted.  What foods are not recommended? As you make food choices, it is important to remember that all foods are not the same. Some foods have fewer nutrients per serving than other foods, even though they might have the same number of calories or carbohydrates. It is difficult to get your body what it needs when you eat foods with fewer nutrients. Examples of foods that you should avoid that are high in calories and carbohydrates but low in nutrients include:  Trans fats (most processed foods list trans fats on the Nutrition Facts label).  Regular soda.  Juice.  Candy.  Sweets, such as cake, pie, doughnuts, and cookies.  Fried foods.  What foods can I eat? Eat nutrient-rich foods, which will nourish your body and keep you healthy. The food you should eat also will depend on several factors, including:  The calories you need.  The medicines you take.  Your weight.  Your blood glucose level.  Your  blood pressure level.  Your cholesterol level.  You should eat a variety of foods, including:  Protein. ? Lean cuts of meat. ? Proteins low in saturated fats, such as fish, egg whites, and beans. Avoid processed meats.  Fruits and vegetables. ? Fruits and vegetables that may help control blood glucose levels, such as apples, mangoes, and yams.  Dairy products. ? Choose fat-free or low-fat dairy products, such as milk, yogurt, and cheese.  Grains, bread, pasta, and rice. ? Choose whole grain products, such as multigrain bread, whole oats, and brown rice. These foods may help control blood pressure.  Fats. ? Foods containing healthful fats, such as nuts, avocado, olive oil, canola oil, and fish.  Does everyone with diabetes mellitus have the same meal plan? Because every person with diabetes mellitus is different, there is not one meal plan that works for everyone. It is very important that you meet with a dietitian who will help you create a meal plan that is just right for you. This information is not intended to replace advice given to you by your health care provider. Make sure you discuss any questions you have with your health care provider. Document Released: 12/21/2004 Document Revised: 09/01/2015 Document Reviewed: 02/20/2013 Elsevier Interactive Patient Education  2017 [employer].   IF you received an x-ray today, you will receive an invoice from [facility]. Please contact [facility] at [phone] with questions or concerns regarding your invoice.   IF you received labwork today, you will receive an invoice from [facility]. Please contact [facility] at [phone] with questions or concerns regarding your invoice.   Our billing staff will not be able to assist you with questions regarding bills from these companies.  You will be contacted with the lab results as soon as they are available. The fastest way to get your results is to activate your My  Chart account. Instructions are located on the last page of this paperwork. If you have not heard from [place] regarding the results in 2 weeks, please contact this office.

## 2016-11-29 LAB — CBC WITH DIFFERENTIAL/PLATELET
Basophils Absolute: 0 10*3/uL (ref 0.0–0.2)
Basos: 0 %
EOS (ABSOLUTE): 0.3 10*3/uL (ref 0.0–0.4)
Eos: 6 %
Hematocrit: 33.7 % — ABNORMAL LOW (ref 34.0–46.6)
Hemoglobin: 10.7 g/dL — ABNORMAL LOW (ref 11.1–15.9)
Immature Grans (Abs): 0 10*3/uL (ref 0.0–0.1)
Immature Granulocytes: 1 %
Lymphocytes Absolute: 1.3 10*3/uL (ref 0.7–3.1)
Lymphs: 25 %
MCH: 26.5 pg — ABNORMAL LOW (ref 26.6–33.0)
MCHC: 31.8 g/dL (ref 31.5–35.7)
MCV: 83 fL (ref 79–97)
Monocytes Absolute: 0.4 10*3/uL (ref 0.1–0.9)
Monocytes: 7 %
Neutrophils Absolute: 3.1 10*3/uL (ref 1.4–7.0)
Neutrophils: 61 %
Platelets: 361 10*3/uL (ref 150–379)
RBC: 4.04 x10E6/uL (ref 3.77–5.28)
RDW: 13.6 % (ref 12.3–15.4)
WBC: 5.1 10*3/uL (ref 3.4–10.8)

## 2016-11-29 LAB — CMP14+EGFR
ALT: 11 IU/L (ref 0–32)
AST: 18 IU/L (ref 0–40)
Albumin/Globulin Ratio: 1 — ABNORMAL LOW (ref 1.2–2.2)
Albumin: 4 g/dL (ref 3.6–4.8)
Alkaline Phosphatase: 85 IU/L (ref 39–117)
BUN/Creatinine Ratio: 25 (ref 12–28)
BUN: 24 mg/dL (ref 8–27)
Bilirubin Total: 0.3 mg/dL (ref 0.0–1.2)
CO2: 23 mmol/L (ref 20–29)
Calcium: 10.2 mg/dL (ref 8.7–10.3)
Chloride: 101 mmol/L (ref 96–106)
Creatinine, Ser: 0.96 mg/dL (ref 0.57–1.00)
GFR calc Af Amer: 70 mL/min/{1.73_m2} (ref >59)
GFR calc non Af Amer: 61 mL/min/{1.73_m2} (ref >59)
Globulin, Total: 3.9 g/dL (ref 1.5–4.5)
Glucose: 170 mg/dL — ABNORMAL HIGH (ref 65–99)
Potassium: 5 mmol/L (ref 3.5–5.2)
Sodium: 142 mmol/L (ref 134–144)
Total Protein: 7.9 g/dL (ref 6.0–8.5)

## 2016-11-29 LAB — MICROALBUMIN / CREATININE URINE RATIO
Creatinine, Urine: 123.9 mg/dL
Microalb/Creat Ratio: 195.1 mg/g creat — ABNORMAL HIGH (ref 0.0–30.0)
Microalbumin, Urine: 241.7 ug/mL

## 2016-12-03 ENCOUNTER — Other Ambulatory Visit: Payer: Self-pay | Admitting: Physician Assistant

## 2016-12-03 MED ORDER — INSULIN PEN NEEDLE 31G X 6 MM MISC: 1.0000 [IU] | 0 refills | Status: DC

## 2016-12-03 NOTE — Progress Notes (Signed)
Please call pt and let her know I sent in a Rx for pen needles to [facility]. Thanks!

## 2016-12-03 NOTE — Progress Notes (Signed)
Opened in error

## 2016-12-05 ENCOUNTER — Other Ambulatory Visit: Payer: Self-pay | Admitting: Physician Assistant

## 2016-12-05 DIAGNOSIS — D649 Anemia, unspecified: Secondary | ICD-10-CM

## 2016-12-12 ENCOUNTER — Other Ambulatory Visit: Payer: Self-pay | Admitting: Physician Assistant

## 2016-12-12 DIAGNOSIS — Z1211 Encounter for screening for malignant neoplasm of colon: Secondary | ICD-10-CM

## 2016-12-12 NOTE — Progress Notes (Signed)
Orders Placed This Encounter  Procedures  . Ambulatory referral to Gastroenterology    Referral Priority:  Routine    Referral Type:  Consultation    Referral Reason:  Specialty Services Required    Number of Visits Requested:  1     

## 2016-12-20 ENCOUNTER — Ambulatory Visit: Payer: Medicare HMO | Admitting: Registered"

## 2016-12-27 ENCOUNTER — Encounter: Payer: Medicare HMO | Attending: Physician Assistant | Admitting: Dietician

## 2016-12-27 ENCOUNTER — Telehealth: Payer: Self-pay | Admitting: Dietician

## 2016-12-27 ENCOUNTER — Ambulatory Visit: Payer: Medicare HMO | Admitting: Registered"

## 2016-12-27 ENCOUNTER — Encounter: Payer: Self-pay | Admitting: Dietician

## 2016-12-27 DIAGNOSIS — Z794 Long term (current) use of insulin: Secondary | ICD-10-CM | POA: Insufficient documentation

## 2016-12-27 DIAGNOSIS — Z713 Dietary counseling and surveillance: Secondary | ICD-10-CM | POA: Diagnosis not present

## 2016-12-27 DIAGNOSIS — E119 Type 2 diabetes mellitus without complications: Secondary | ICD-10-CM

## 2016-12-27 NOTE — Patient Instructions (Signed)
Consider discussing less expensive options for your insulin with your doctor. Consider contacting the company that makes the invokamet to see if they have coupons.  Suggest avoiding beverages with added sugar. Be active daily.  Aim for 30 minutes most days (dance, play, march with the children). Be mindful of portion sizes. Before a snack, ask if you are hungry.  Plan:  Aim for 3-4 Carb Choices per meal (45-60 grams) +/- 1 either way  Aim for 0-1 Carbs per snack if hungry  Include protein in moderation with your meals and snacks Consider reading food labels for Total Carbohydrate and Fat Grams of foods Consider checking BG at alternate times per day as directed by MD    Free Style Libre meter is the meter you questioned.  This runs about $75 per month and may be difficult to cover with Medicare.

## 2016-12-27 NOTE — Progress Notes (Signed)
Diabetes Self-Management Education  Visit Type: First/Initial  Appt. Start Time: 1100 Appt. End Time: 1230  12/27/2016  Jeanette Ellis, identified by name and date of birth, is a 66 y.o. female with a diagnosis of Diabetes: Type 2.   Other hx includes Hyperlipidemia, HTN, and sarcoidosis.   Medications include Toujeo and Invokamet.  She is now in the Medicare Donut hole and has problems affording her diabetes medications.  Last month she missed a week due inability to afford her medications.  I was able to find discount cards for both of these medications and will mail them to patient.  I called her and informed her.  There other option is less expensive insulin and medication options.  Patient lives at home with her husband.   They share shopping and cooking. 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  This makes it hard to have time to exercise.  ASSESSMENT  Height  (1.778 m), weight 193 lb (87.5 kg). Body mass index is 27.69 kg/m.      Diabetes Self-Management Education - 12/27/16 1108      Visit Information   Visit Type First/Initial     Initial Visit   Diabetes Type Type 2   Are you currently following a meal plan? No   Are you taking your medications as prescribed? No  in donut hole and sometimes cannot afford.  Last month missed 1 week.   Date Diagnosed about 30 years ago     Health Coping   How would you rate your overall health? Fair     Psychosocial Assessment   Patient Belief/Attitude about Diabetes Motivated to manage diabetes   Self-care barriers Lack of material resources   Self-management support Doctor's office   Other persons present Patient   Patient Concerns Nutrition/Meal planning;Glycemic Control   Special Needs None   Preferred Learning Style No preference indicated   Learning Readiness Ready   How often do you need to have someone help you when you read instructions, pamphlets, or other written materials from your  doctor or pharmacy? 1 - Never   What is the last grade level you completed in school? 4 years college     Pre-Education Assessment   Patient understands the diabetes disease and treatment process. Needs Review   Patient understands incorporating nutritional management into lifestyle. Needs Review   Patient undertands incorporating physical activity into lifestyle. Needs Review   Patient understands using medications safely. Needs Review   Patient understands monitoring blood glucose, interpreting and using results Needs Review   Patient understands prevention, detection, and treatment of acute complications. Needs Review   Patient understands prevention, detection, and treatment of chronic complications. Needs Review   Patient understands how to develop strategies to address psychosocial issues. Needs Review   Patient understands how to develop strategies to promote health/change behavior. Needs Review     Complications   Last HgB A1C per patient/outside source 8.3 %  11/28/16   How often do you check your blood sugar? 1-2 times/day   Fasting Blood glucose range (mg/dL) 16-109;604-540  >981 when off medication   Postprandial Blood glucose range (mg/dL) 191-478;295-621   Number of hypoglycemic episodes per month 0   Number of hyperglycemic episodes per week 10   Can you tell when your blood sugar is high? No   Have you had a dilated eye exam in the past 12 months? No  it's time   Have you had a  dental exam in the past 12 months? Yes   Are you checking your feet? Yes   How many days per week are you checking your feet? 7     Dietary Intake   Breakfast grits and sausage OR [employer] gravy biscuit once a week OR cheerios (plain), banana 2% milk OR oatmeal occasionally  7:30-8:30   Snack (morning) melon and cucumbers OR PB&J sandwich on [employer]  just habit   Lunch salad with chicken, fruit, [employer] bread OR sandwich and fruit   11:30   Snack (afternoon) yogurt or fruit   Dinner meat or fish, [employer]  bread, starch, fruit, vegetables, occasional ice cream  7   Snack (evening) leftovers   Beverage(s) water, sweet tea occasionally, rare 1-2 ounces juice mixed with water     Exercise   Exercise Type Light (walking / raking leaves)   How many days per week to you exercise? 0   How many minutes per day do you exercise? 0   Total minutes per week of exercise 0     Patient Education   Previous Diabetes Education No   Disease state  Definition of diabetes, type 1 and 2, and the diagnosis of diabetes   Nutrition management  Role of diet in the treatment of diabetes and the relationship between the three main macronutrients and blood glucose level;Food label reading, portion sizes and measuring food.;Meal options for control of blood glucose level and chronic complications.;Meal timing in regards to the patients' current diabetes medication.      Individualized Plan for Diabetes Self-Management Training:   Learning Objective:  Patient will have a greater understanding of diabetes self-management. Patient education plan is to attend individual and/or group sessions per assessed needs and concerns.   Plan:   Patient Instructions  Consider discussing less expensive options for your insulin with your doctor. Consider contacting the company that makes the invokamet to see if they have coupons.  Suggest avoiding beverages with added sugar. Be active daily.  Aim for 30 minutes most days (dance, play, march with the children). Be mindful of portion sizes. Before a snack, ask if you are hungry.  Plan:  Aim for 3-4 Carb Choices per meal (45-60 grams) +/- 1 either way  Aim for 0-1 Carbs per snack if hungry  Include protein in moderation with your meals and snacks Consider reading food labels for Total Carbohydrate and Fat Grams of foods Consider checking BG at alternate times per day as directed by MD    Free Style Libre meter is the meter you questioned.  This runs about $75 per month and  may be difficult to cover with Medicare.    Expected Outcomes:   patient demonstrated an interest in learning.  Positives outcome expected.  Education material provided: Living Well with Diabetes, Food label handouts, A1C conversion sheet, Meal plan card, My Plate and Snack sheet  If problems or questions, patient to contact team via:  Phone  Future DSME appointment:  prn

## 2017-01-02 ENCOUNTER — Ambulatory Visit (INDEPENDENT_AMBULATORY_CARE_PROVIDER_SITE_OTHER): Payer: Medicare HMO | Admitting: Physician Assistant

## 2017-01-02 ENCOUNTER — Encounter: Payer: Self-pay | Admitting: Physician Assistant

## 2017-01-02 VITALS — BP 122/72 | HR 78 | Temp 98.2°F | Resp 17 | Ht 68.5 in | Wt 196.0 lb

## 2017-01-02 DIAGNOSIS — D649 Anemia, unspecified: Secondary | ICD-10-CM

## 2017-01-02 LAB — POCT CBC
Granulocyte percent: 66.2 %G (ref 37–80)
HCT, POC: 29.7 % — AB (ref 37.7–47.9)
Hemoglobin: 9.7 g/dL — AB (ref 12.2–16.2)
Lymph, poc: 1.4 (ref 0.6–3.4)
MCH, POC: 27 pg (ref 27–31.2)
MCHC: 32.7 g/dL (ref 31.8–35.4)
MCV: 82.3 fL (ref 80–97)
MID (cbc): 0.3 (ref 0–0.9)
MPV: 7.7 fL (ref 0–99.8)
POC Granulocyte: 3.2 (ref 2–6.9)
POC LYMPH PERCENT: 27.8 %L (ref 10–50)
POC MID %: 6 %M (ref 0–12)
Platelet Count, POC: 255 10*3/uL (ref 142–424)
RBC: 3.61 M/uL — AB (ref 4.04–5.48)
RDW, POC: 14.7 %
WBC: 4.9 10*3/uL (ref 4.6–10.2)

## 2017-01-02 NOTE — Patient Instructions (Addendum)
I have collected more labs today and should have your results within one week. I have also placed a referral to GI for you to have a colonoscopy. Once I have these results, I will call you with further treatment plan. In the meantime, seek care immediately if you develop lightheadedness, dizziness, extreme fatigue, shortness of breath, or skin color changes. Thank you for letting me participate in your health and well being. Anemia, Nonspecific Anemia is a condition in which the concentration of red blood cells or hemoglobin in the blood is below normal. Hemoglobin is a substance in red blood cells that carries oxygen to the tissues of the body. Anemia results in not enough oxygen reaching these tissues. What are the causes? Common causes of anemia include:  Excessive bleeding. Bleeding may be internal or external. This includes excessive bleeding from periods (in women) or from the intestine.  Poor nutrition.  Chronic kidney, thyroid, and liver disease.  Bone marrow disorders that decrease red blood cell production.  Cancer and treatments for cancer.  HIV, AIDS, and their treatments.  Spleen problems that increase red blood cell destruction.  Blood disorders.  Excess destruction of red blood cells due to infection, medicines, and autoimmune disorders.  What are the signs or symptoms?  Minor weakness.  Dizziness.  Headache.  Palpitations.  Shortness of breath, especially with exercise.  Paleness.  Cold sensitivity.  Indigestion.  Nausea.  Difficulty sleeping.  Difficulty concentrating. Symptoms may occur suddenly or they may develop slowly. How is this diagnosed? Additional blood tests are often needed. These help your health care provider determine the best treatment. Your health care provider will check your stool for blood and look for other causes of blood loss. How is this treated? Treatment varies depending on the cause of the anemia. Treatment can  include:  Supplements of iron, vitamin B12, or folic acid.  Hormone medicines.  A blood transfusion. This may be needed if blood loss is severe.  Hospitalization. This may be needed if there is significant continual blood loss.  Dietary changes.  Spleen removal.  Follow these instructions at home: Keep all follow-up appointments. It often takes many weeks to correct anemia, and having your health care provider check on your condition and your response to treatment is very important. Get help right away if:  You develop extreme weakness, shortness of breath, or chest pain.  You become dizzy or have trouble concentrating.  You develop heavy vaginal bleeding.  You develop a rash.  You have bloody or black, tarry stools.  You faint.  You vomit up blood.  You vomit repeatedly.  You have abdominal pain.  You have a fever or persistent symptoms for more than 2-3 days.  You have a fever and your symptoms suddenly get worse.  You are dehydrated. This information is not intended to replace advice given to you by your health care provider. Make sure you discuss any questions you have with your health care provider. Document Released: 05/03/2004 Document Revised: 09/07/2015 Document Reviewed: 09/19/2012 Elsevier Interactive Patient Education  2017 [employer].      IF you received an x-ray today, you will receive an invoice from [facility]. Please contact [facility] at [phone] with questions or concerns regarding your invoice.   IF you received labwork today, you will receive an invoice from [facility]. Please contact [facility] at [phone] with questions or concerns regarding your invoice.   Our billing staff will not be able to assist you with questions regarding bills from  these companies.  You will be contacted with the lab results as soon as they are available. The fastest way to get your results is to activate your My Chart account.  Instructions are located on the last page of this paperwork. If you have not heard from [place] regarding the results in 2 weeks, please contact this office.

## 2017-01-02 NOTE — Progress Notes (Signed)
Jeanette Ellis  MRN: [id number] DOB: [birth year]  Subjective:  Jeanette Ellis is a 66 y.o. female seen in office today for a chief complaint of follow up on anemia. Pt initially evaluated by me on 11/28/16 for T2DM. Incidental findings on CBC showed Hgb of 10.7. Pt thinks that 1.5 years ago she was told she was anemic. She has been taking one iron tablet daily for the past month.  Notes she had fatigue a month ago but that has since resolved. Denies dizziness, lightheadedness, SOB, and heart palpitations. Denies acute blood loss,  melena, hematochezia, vaginal bleeding, hematuria, and hematemesis. She is not up to date on colonoscopy. Has not had pap in >30 years due to hysterectomy at that time.  Last mammogram was this past year and was normal. No hx of kidney disease. No recent hospitalizations. Denies smoking.   Review of Systems  Constitutional: Negative for activity change, appetite change, chills, diaphoresis, fever and unexpected weight change.  Respiratory: Negative for cough and shortness of breath.   Cardiovascular: Negative for chest pain and palpitations.  Neurological: Negative for weakness, numbness and headaches.    Patient Active Problem List   Diagnosis Date Noted  . History of sarcoidosis 06/09/2015  . Type 2 diabetes mellitus without complication, with long-term current use of insulin (HCC) 01/12/2014  . Essential hypertension 09/21/2013  . Hyperlipemia 09/21/2013    Current Outpatient Prescriptions on File Prior to Visit  Medication Sig Dispense Refill  . aspirin EC 81 MG tablet Take 81 mg by mouth.    Marland Kitchen atorvastatin (LIPITOR) 20 MG tablet TAKE ONE TABLET BY MOUTH ONCE DAILY    . ferrous sulfate 325 (65 FE) MG EC tablet Take 325 mg by mouth 3 (three) times daily with meals.    . Insulin Glargine (TOUJEO SOLOSTAR) 300 UNIT/ML SOPN Inject 25 Units into the skin daily. 4.5 mL 0  . Insulin Pen Needle 31G X 6 MM MISC 1 Units by Does not apply route once a week. 100  each 0  . Insulin Syringe-Needle U-100 (INSULIN SYRINGE .3CC/31GX5/16") 31G X 5/16" 0.3 ML MISC Use with Toujeo pen once a day 100 each 1  . INVOKAMET [id number] MG TABS Take 1 tablet by mouth 2 (two) times daily. 180 tablet 0  . lisinopril (PRINIVIL,ZESTRIL) 10 MG tablet     . losartan (COZAAR) 50 MG tablet     . Multiple Vitamin (MULTIVITAMIN WITH MINERALS) TABS tablet Take 1 tablet by mouth daily.     No current facility-administered medications on file prior to visit.     No Known Allergies   Objective:  BP 122/72   Pulse 78   Temp 98.2 F (36.8 C) (Oral)   Resp 17   Ht 5' 8.5" (1.74 m)   Wt 196 lb (88.9 kg)   SpO2 98%   BMI 29.37 kg/m   Physical Exam  Constitutional: She is oriented to person, place, and time and well-developed, well-nourished, and in no distress.  HENT:  Head: Normocephalic and atraumatic.  Mouth/Throat: Uvula is midline, oropharynx is clear and moist and mucous membranes are normal.  Eyes: Pupils are equal, round, and reactive to light. Conjunctivae are normal.  Neck: Normal range of motion.  Cardiovascular: Normal rate, regular rhythm, normal heart sounds and normal pulses.   Pulmonary/Chest: Effort normal. She has no wheezes. She has no rhonchi. She has no rales.  Neurological: She is alert and oriented to person, place, and time. Gait normal.  Skin: Skin is warm  and dry.  Psychiatric: Affect normal.  Vitals reviewed.    Results for orders placed or performed in visit on 01/02/17 (from the past 24 hour(s))  POCT CBC     Status: Abnormal   Collection Time: 01/02/17  2:24 PM  Result Value Ref Range   WBC 4.9 4.6 - 10.2 K/uL   Lymph, poc 1.4 0.6 - 3.4   POC LYMPH PERCENT 27.8 10 - 50 %L   MID (cbc) 0.3 0 - 0.9   POC MID % 6.0 0 - 12 %M   POC Granulocyte 3.2 2 - 6.9   Granulocyte percent 66.2 37 - 80 %G   RBC 3.61 (A) 4.04 - 5.48 M/uL   Hemoglobin 9.7 (A) 12.2 - 16.2 g/dL   HCT, POC 16.1 (A) 09.6 - 47.9 %   MCV 82.3 80 - 97 fL   MCH, POC 27.0  27 - 31.2 pg   MCHC 32.7 31.8 - 35.4 g/dL   RDW, POC 04.5 %   Platelet Count, POC 255 142 - 424 K/uL   MPV 7.7 0 - 99.8 fL     Assessment and Plan :  This case was precepted with Dr. Alvy Bimler.  1. Anemia, unspecified type Unclear etiology at this time. POCT CBC shows hemoglobin is down to 9.7. She is asymptomatic and denies any signs of acute blood loss. Pt appears well, in no distress. Vitals are stable. Further labs pending. Given strict ED precautions.  - POCT CBC - Anemia panel - Pathologist smear review - Ambulatory referral to Gastroenterology  Benjiman Core PA-C  Primary [facility] at [facility] 01/02/2017 2:30 PM

## 2017-01-03 ENCOUNTER — Telehealth: Payer: Self-pay | Admitting: Physician Assistant

## 2017-01-03 NOTE — Telephone Encounter (Signed)
I have contacted pt and informed her of hemoglobin reading of 7.5. Pt needs to return tomorrow for CBC recheck as her CBC in house showed hemoglobin of 9.7. She is not symptomatic. Depending on CBC results, will determine tx plan. If still at 9, consider referral to hematology. Pt instructed to go to ED if she develops any shortness of breath, fatigue, lightheadedness, dizziness, or heart palpitations.

## 2017-01-04 ENCOUNTER — Ambulatory Visit (INDEPENDENT_AMBULATORY_CARE_PROVIDER_SITE_OTHER): Payer: Medicare HMO

## 2017-01-04 ENCOUNTER — Ambulatory Visit (INDEPENDENT_AMBULATORY_CARE_PROVIDER_SITE_OTHER): Payer: Medicare HMO | Admitting: Urgent Care

## 2017-01-04 ENCOUNTER — Encounter: Payer: Self-pay | Admitting: Urgent Care

## 2017-01-04 VITALS — BP 132/70 | HR 78 | Temp 98.1°F | Resp 16 | Ht 68.5 in | Wt 196.0 lb

## 2017-01-04 DIAGNOSIS — R05 Cough: Secondary | ICD-10-CM

## 2017-01-04 DIAGNOSIS — Z832 Family history of diseases of the blood and blood-forming organs and certain disorders involving the immune mechanism: Secondary | ICD-10-CM

## 2017-01-04 DIAGNOSIS — Z862 Personal history of diseases of the blood and blood-forming organs and certain disorders involving the immune mechanism: Secondary | ICD-10-CM | POA: Diagnosis not present

## 2017-01-04 DIAGNOSIS — D649 Anemia, unspecified: Secondary | ICD-10-CM

## 2017-01-04 DIAGNOSIS — R059 Cough, unspecified: Secondary | ICD-10-CM

## 2017-01-04 LAB — ANEMIA PANEL
Ferritin: 149 ng/mL (ref 15–150)
Folate, Hemolysate: 353.9 ng/mL
Folate, RBC: 1573 ng/mL (ref >498)
Hematocrit: 22.5 % — ABNORMAL LOW (ref 34.0–46.6)
Iron Saturation: 19 % (ref 15–55)
Iron: 52 ug/dL (ref 27–139)
Retic Ct Pct: 1 % (ref 0.6–2.6)
Total Iron Binding Capacity: 280 ug/dL (ref 250–450)
UIBC: 228 ug/dL (ref 118–369)
Vitamin B-12: 1049 pg/mL (ref 232–1245)

## 2017-01-04 LAB — PATHOLOGIST SMEAR REVIEW
Basophils Absolute: 0 10*3/uL (ref 0.0–0.2)
Basos: 0 %
EOS (ABSOLUTE): 0.3 10*3/uL (ref 0.0–0.4)
Eos: 7 %
Hemoglobin: 7.5 g/dL — ABNORMAL LOW (ref 11.1–15.9)
Immature Grans (Abs): 0 10*3/uL (ref 0.0–0.1)
Immature Granulocytes: 0 %
Lymphocytes Absolute: 1.2 10*3/uL (ref 0.7–3.1)
Lymphs: 24 %
MCH: 27.2 pg (ref 26.6–33.0)
MCHC: 33.3 g/dL (ref 31.5–35.7)
MCV: 82 fL (ref 79–97)
Monocytes Absolute: 0.5 10*3/uL (ref 0.1–0.9)
Monocytes: 10 %
Neutrophils Absolute: 2.8 10*3/uL (ref 1.4–7.0)
Neutrophils: 59 %
Path Rev PLTs: NORMAL
Path Rev WBC: NORMAL
Platelets: 293 10*3/uL (ref 150–379)
RBC: 2.76 x10E6/uL — ABNORMAL LOW (ref 3.77–5.28)
RDW: 14 % (ref 12.3–15.4)
WBC: 4.8 10*3/uL (ref 3.4–10.8)

## 2017-01-04 LAB — CBC WITH DIFFERENTIAL/PLATELET
Basophils Absolute: 0 10*3/uL (ref 0.0–0.2)
Basos: 0 %
EOS (ABSOLUTE): 0.4 10*3/uL (ref 0.0–0.4)
Eos: 8 %
Hematocrit: 30.4 % — ABNORMAL LOW (ref 34.0–46.6)
Hemoglobin: 10.1 g/dL — ABNORMAL LOW (ref 11.1–15.9)
Lymphocytes Absolute: 1 10*3/uL (ref 0.7–3.1)
Lymphs: 24 %
MCH: 26.4 pg — ABNORMAL LOW (ref 26.6–33.0)
MCHC: 33.2 g/dL (ref 31.5–35.7)
MCV: 79 fL (ref 79–97)
Monocytes Absolute: 0.4 10*3/uL (ref 0.1–0.9)
Monocytes: 9 %
Neutrophils Absolute: 2.5 10*3/uL (ref 1.4–7.0)
Neutrophils: 59 %
Platelets: 270 10*3/uL (ref 150–379)
RBC: 3.83 x10E6/uL (ref 3.77–5.28)
RDW: 13 % (ref 12.3–15.4)
WBC: 4.2 10*3/uL (ref 3.4–10.8)

## 2017-01-04 LAB — POCT CBC
Granulocyte percent: 68 %G (ref 37–80)
HCT, POC: 31.5 % — AB (ref 37.7–47.9)
Hemoglobin: 10.2 g/dL — AB (ref 12.2–16.2)
Lymph, poc: 1.1 (ref 0.6–3.4)
MCH, POC: 27 pg (ref 27–31.2)
MCHC: 32.5 g/dL (ref 31.8–35.4)
MCV: 83 fL (ref 80–97)
MID (cbc): 0.2 (ref 0–0.9)
MPV: 8.9 fL (ref 0–99.8)
POC Granulocyte: 2.9 (ref 2–6.9)
POC LYMPH PERCENT: 26.6 %L (ref 10–50)
POC MID %: 5.4 %M (ref 0–12)
Platelet Count, POC: 259 10*3/uL (ref 142–424)
RBC: 3.79 M/uL — AB (ref 4.04–5.48)
RDW, POC: 14.2 %
WBC: 4.2 10*3/uL — AB (ref 4.6–10.2)

## 2017-01-04 NOTE — Progress Notes (Signed)
MRN: [id number] DOB: 1950/07/04  Subjective:   Jeanette Ellis is a 66 y.o. female presenting for follow up on anemia. She has been seen twice for the same on 11/28/2016 and 01/02/2017. Her hemoglobin has been trending downward, from 10.7 to 9.7 on aforementioned dates for point of care labs. However, her labs were sent out as well and show that she had a hemoglobin of 7.5. Her anemia panel was very normal. A referral has been placed for patient to undergo a colonoscopy. Patient was to come in today for a recheck. Today, reports that she has had a 1 month history of productive cough without hemoptysis. Denies fevers, chest pain, shob, wheezing, n/v, abdominal pain, rashes over her body, unexplained weight loss, fatigue. Admits that she had fatigue over the summer in August but has since resolved. She does have intermittent headaches. Admits personal history of sarcoidosis, family history of sarcoidosis.  Jeanette Ellis has a current medication list which includes the following prescription(s): aspirin ec, atorvastatin, ferrous sulfate, insulin glargine, insulin pen needle, insulin syringe .3cc/31gx5/16", invokamet, lisinopril, losartan, and multivitamin with minerals. Also has No Known Allergies.  Jeanette Ellis  has a past medical history of Diabetes mellitus without complication (HCC); High cholesterol; Hypertension; and Sarcoidosis. Also  has a past surgical history that includes Abdominal hysterectomy.  Objective:   Vitals: BP 132/70   Pulse 78   Temp 98.1 F (36.7 C) (Oral)   Resp 16   Ht 5' 8.5" (1.74 m)   Wt 196 lb (88.9 kg)   SpO2 99%   BMI 29.37 kg/m   Physical Exam  Constitutional: She is oriented to person, place, and time. She appears well-developed and well-nourished.  Neck: Normal range of motion. Neck supple.  Cardiovascular: Normal rate, regular rhythm and intact distal pulses.  Exam reveals no gallop and no friction rub.   No murmur heard. Pulmonary/Chest: No respiratory distress.  She has no wheezes. She has no rales.  Lymphadenopathy:    She has no cervical adenopathy.  Neurological: She is alert and oriented to person, place, and time.  Skin: Skin is warm and dry. No rash noted.  Psychiatric: She has a normal mood and affect.   Results for orders placed or performed in visit on 01/04/17 (from the past 24 hour(s))  POCT CBC     Status: Abnormal   Collection Time: 01/04/17 11:22 AM  Result Value Ref Range   WBC 4.2 (A) 4.6 - 10.2 K/uL   Lymph, poc 1.1 0.6 - 3.4   POC LYMPH PERCENT 26.6 10 - 50 %L   MID (cbc) 0.2 0 - 0.9   POC MID % 5.4 0 - 12 %M   POC Granulocyte 2.9 2 - 6.9   Granulocyte percent 68.0 37 - 80 %G   RBC 3.79 (A) 4.04 - 5.48 M/uL   Hemoglobin 10.2 (A) 12.2 - 16.2 g/dL   HCT, POC 11.9 (A) 14.7 - 47.9 %   MCV 83.0 80 - 97 fL   MCH, POC 27.0 27 - 31.2 pg   MCHC 32.5 31.8 - 35.4 g/dL   RDW, POC 82.9 %   Platelet Count, POC 259 142 - 424 K/uL   MPV 8.9 0 - 99.8 fL   Dg Chest 2 View  Result Date: 01/04/2017 CLINICAL DATA:  Cough. Family history of sarcoidosis. Per EMR, personal history of sarcoidosis. EXAM: CHEST  2 VIEW COMPARISON:  Chest CT 01/09/2016.  Chest x-ray 07/05/2015 FINDINGS: There is interstitial opacities with coarse appearance at the  bases, where there is fibrosis by CT. There is bilateral hilar and mediastinal calcified adenopathy, underestimated relative to CT. Asymmetric pleural based thickening at the left apex that is stable. No Kerley lines, air bronchogram, effusion, or pneumothorax. IMPRESSION: Chronic lung disease and calcified thoracic adenopathy as described on chest CT 01/09/2016 and correlating with history of sarcoidosis. No detectable progression. No acute superimposed finding. Electronically Signed   By: Marnee Spring M.D.   On: 01/04/2017 11:59   Assessment and Plan :   1. Anemia, unspecified type 2. Low hemoglobin 3. Cough 4. Personal history of sarcoidosis 5. 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 Labs pending. Her chest x-ray shows chronic lung disease d/t her history of sarcoidosis and report is reassuring given that they do not see progression of disease. I ordered a stat cbc w/differential to be sent out. POC cbc is reassuring but if lab comes back critical from [facility], I will direct patient to the ER for consideration of transfusion, admission and further work up. ER and return-to-clinic precautions discussed, patient verbalized understanding. I did recommend patient proceed with colonoscopy as planned at her previous visit for general screening but also to rule this out as a source of her anemia. Her peripheral smear is still pending, advised patient that she will be notified of her results by ordering provider. Patient verbalized understanding.  Wallis Bamberg, PA-C [facility] [phone] 01/04/2017 11:04 AM

## 2017-01-04 NOTE — Patient Instructions (Addendum)
Anemia, Nonspecific Anemia is a condition in which the concentration of red blood cells or hemoglobin in the blood is below normal. Hemoglobin is a substance in red blood cells that carries oxygen to the tissues of the body. Anemia results in not enough oxygen reaching these tissues. What are the causes? Common causes of anemia include:  Excessive bleeding. Bleeding may be internal or external. This includes excessive bleeding from periods (in women) or from the intestine.  Poor nutrition.  Chronic kidney, thyroid, and liver disease.  Bone marrow disorders that decrease red blood cell production.  Cancer and treatments for cancer.  HIV, AIDS, and their treatments.  Spleen problems that increase red blood cell destruction.  Blood disorders.  Excess destruction of red blood cells due to infection, medicines, and autoimmune disorders.  What are the signs or symptoms?  Minor weakness.  Dizziness.  Headache.  Palpitations.  Shortness of breath, especially with exercise.  Paleness.  Cold sensitivity.  Indigestion.  Nausea.  Difficulty sleeping.  Difficulty concentrating. Symptoms may occur suddenly or they may develop slowly. How is this diagnosed? Additional blood tests are often needed. These help your health care provider determine the best treatment. Your health care provider will check your stool for blood and look for other causes of blood loss. How is this treated? Treatment varies depending on the cause of the anemia. Treatment can include:  Supplements of iron, vitamin B12, or folic acid.  Hormone medicines.  A blood transfusion. This may be needed if blood loss is severe.  Hospitalization. This may be needed if there is significant continual blood loss.  Dietary changes.  Spleen removal.  Follow these instructions at home: Keep all follow-up appointments. It often takes many weeks to correct anemia, and having your health care provider check on  your condition and your response to treatment is very important. Get help right away if:  You develop extreme weakness, shortness of breath, or chest pain.  You become dizzy or have trouble concentrating.  You develop heavy vaginal bleeding.  You develop a rash.  You have bloody or black, tarry stools.  You faint.  You vomit up blood.  You vomit repeatedly.  You have abdominal pain.  You have a fever or persistent symptoms for more than 2-3 days.  You have a fever and your symptoms suddenly get worse.  You are dehydrated. This information is not intended to replace advice given to you by your health care provider. Make sure you discuss any questions you have with your health care provider. Document Released: 05/03/2004 Document Revised: 09/07/2015 Document Reviewed: 09/19/2012 Elsevier Interactive Patient Education  2017 [employer].     IF you received an x-ray today, you will receive an invoice from [facility]. Please contact [facility] at [phone] with questions or concerns regarding your invoice.   IF you received labwork today, you will receive an invoice from [facility]. Please contact [facility] at [phone] with questions or concerns regarding your invoice.   Our billing staff will not be able to assist you with questions regarding bills from these companies.  You will be contacted with the lab results as soon as they are available. The fastest way to get your results is to activate your My Chart account. Instructions are located on the last page of this paperwork. If you have not heard from [place] regarding the results in 2 weeks, please contact this office.

## 2017-01-05 LAB — FERRITIN: Ferritin: 147 ng/mL (ref 15–150)

## 2017-01-05 LAB — RETICULOCYTES: Retic Ct Pct: 1.3 % (ref 0.6–2.6)

## 2017-01-07 ENCOUNTER — Other Ambulatory Visit: Payer: Self-pay | Admitting: Physician Assistant

## 2017-01-07 DIAGNOSIS — D649 Anemia, unspecified: Secondary | ICD-10-CM

## 2017-01-15 ENCOUNTER — Encounter: Payer: Self-pay | Admitting: Gastroenterology

## 2017-01-17 ENCOUNTER — Ambulatory Visit: Payer: Medicare HMO | Admitting: Physician Assistant

## 2017-01-28 ENCOUNTER — Ambulatory Visit: Payer: Medicare HMO | Admitting: Physician Assistant

## 2017-02-04 ENCOUNTER — Ambulatory Visit (INDEPENDENT_AMBULATORY_CARE_PROVIDER_SITE_OTHER): Payer: Medicare HMO | Admitting: Physician Assistant

## 2017-02-04 ENCOUNTER — Encounter: Payer: Self-pay | Admitting: Physician Assistant

## 2017-02-04 VITALS — BP 138/78 | HR 76 | Temp 99.3°F | Resp 18 | Ht 68.66 in | Wt 200.6 lb

## 2017-02-04 DIAGNOSIS — D649 Anemia, unspecified: Secondary | ICD-10-CM

## 2017-02-04 NOTE — Patient Instructions (Addendum)
I have contacted our referral person and she is looking into why they have not contacted yet.  She is going to call them today.  That being said, the [facility] should contact you within the next 1-2 weeks to schedule an appointment for further evaluation of your anemia.  In the meantime, make sure you go and have your colonoscopy.  If you ever develop any dizziness, lightheadedness, fatigue, heart palpitations, or overall weakness please return to office immediately. Thank you for letting me participate in your health and well being. Anemia, Nonspecific Anemia is a condition in which the concentration of red blood cells or hemoglobin in the blood is below normal. Hemoglobin is a substance in red blood cells that carries oxygen to the tissues of the body. Anemia results in not enough oxygen reaching these tissues. What are the causes? Common causes of anemia include:  Excessive bleeding. Bleeding may be internal or external. This includes excessive bleeding from periods (in women) or from the intestine.  Poor nutrition.  Chronic kidney, thyroid, and liver disease.  Bone marrow disorders that decrease red blood cell production.  Cancer and treatments for cancer.  HIV, AIDS, and their treatments.  Spleen problems that increase red blood cell destruction.  Blood disorders.  Excess destruction of red blood cells due to infection, medicines, and autoimmune disorders.  What are the signs or symptoms?  Minor weakness.  Dizziness.  Headache.  Palpitations.  Shortness of breath, especially with exercise.  Paleness.  Cold sensitivity.  Indigestion.  Nausea.  Difficulty sleeping.  Difficulty concentrating. Symptoms may occur suddenly or they may develop slowly. How is this diagnosed? Additional blood tests are often needed. These help your health care provider determine the best treatment. Your health care provider will check your stool for blood and look for other  causes of blood loss. How is this treated? Treatment varies depending on the cause of the anemia. Treatment can include:  Supplements of iron, vitamin B12, or folic acid.  Hormone medicines.  A blood transfusion. This may be needed if blood loss is severe.  Hospitalization. This may be needed if there is significant continual blood loss.  Dietary changes.  Spleen removal.  Follow these instructions at home: Keep all follow-up appointments. It often takes many weeks to correct anemia, and having your health care provider check on your condition and your response to treatment is very important. Get help right away if:  You develop extreme weakness, shortness of breath, or chest pain.  You become dizzy or have trouble concentrating.  You develop heavy vaginal bleeding.  You develop a rash.  You have bloody or black, tarry stools.  You faint.  You vomit up blood.  You vomit repeatedly.  You have abdominal pain.  You have a fever or persistent symptoms for more than 2-3 days.  You have a fever and your symptoms suddenly get worse.  You are dehydrated. This information is not intended to replace advice given to you by your health care provider. Make sure you discuss any questions you have with your health care provider. Document Released: 05/03/2004 Document Revised: 09/07/2015 Document Reviewed: 09/19/2012 Elsevier Interactive Patient Education  2017 [employer].     IF you received an x-ray today, you will receive an invoice from [facility]. Please contact [facility] at [phone] with questions or concerns regarding your invoice.   IF you received labwork today, you will receive an invoice from [facility]. Please contact [facility] at [phone] with questions or concerns  regarding your invoice.   Our billing staff will not be able to assist you with questions regarding bills from these companies.  You will be contacted with the lab results  as soon as they are available. The fastest way to get your results is to activate your My Chart account. Instructions are located on the last page of this paperwork. If you have not heard from us regarding the results in 2 weeks, please contact this office.

## 2017-02-04 NOTE — Progress Notes (Signed)
   Jeanette SodaYvella S Zawadzki  MRN: [id number] DOB: 02-Jul-1950  Subjective:  Jeanette Ellis is a 66 y.o. female seen in office today for a chief complaint of follow up on anemia. Pt has been seen here for anemia and been referred to hematology. She has not followed up with hematology as she has not heard from them for an appointment yet. She is doing well today. Has colonoscopy scheduled for November. Has no other questions or concerns.   Review of Systems  Constitutional: Negative for chills, diaphoresis and fever.  Cardiovascular: Negative for chest pain and palpitations.  Neurological: Negative for dizziness and light-headedness.    Patient Active Problem List   Diagnosis Date Noted  . History of sarcoidosis 06/09/2015  . Type 2 diabetes mellitus without complication, with long-term current use of insulin (HCC) 01/12/2014  . Essential hypertension 09/21/2013  . Hyperlipemia 09/21/2013    Current Outpatient Prescriptions on File Prior to Visit  Medication Sig Dispense Refill  . aspirin EC 81 MG tablet Take 81 mg by mouth.    Marland Kitchen. atorvastatin (LIPITOR) 20 MG tablet TAKE ONE TABLET BY MOUTH ONCE DAILY    . ferrous sulfate 325 (65 FE) MG EC tablet Take 325 mg by mouth once.     . Insulin Glargine (TOUJEO SOLOSTAR) 300 UNIT/ML SOPN Inject 25 Units into the skin daily. 4.5 mL 0  . Insulin Pen Needle 31G X 6 MM MISC 1 Units by Does not apply route once a week. 100 each 0  . Insulin Syringe-Needle U-100 (INSULIN SYRINGE .3CC/31GX5/16") 31G X 5/16" 0.3 ML MISC Use with Toujeo pen once a day 100 each 1  . INVOKAMET [phone] MG TABS Take 1 tablet by mouth 2 (two) times daily. 180 tablet 0  . lisinopril (PRINIVIL,ZESTRIL) 10 MG tablet     . losartan (COZAAR) 50 MG tablet     . Multiple Vitamin (MULTIVITAMIN WITH MINERALS) TABS tablet Take 1 tablet by mouth daily.     No current facility-administered medications on file prior to visit.     No Known Allergies   Objective:  BP 138/78 (BP Location:  Left Arm, Patient Position: Sitting, Cuff Size: Normal)   Pulse 76   Temp 99.3 F (37.4 C) (Oral)   Resp 18   Ht 5' 8.66" (1.744 m)   Wt 200 lb 9.6 oz (91 kg)   SpO2 98%   BMI 29.92 kg/m   Physical Exam  Constitutional: She is oriented to person, place, and time and well-developed, well-nourished, and in no distress.  HENT:  Head: Normocephalic and atraumatic.  Eyes: Conjunctivae are normal.  Neck: Normal range of motion.  Pulmonary/Chest: Effort normal.  Neurological: She is alert and oriented to person, place, and time. Gait normal.  Skin: Skin is warm and dry.  Psychiatric: Affect normal.  Vitals reviewed.   Assessment and Plan :  1. Anemia, unspecified type She continues to remain asymptomatic. Pt's [facility]/15[facility]. I have clarified this with our referral team. Educated pt that she should expect to hear from them in 1-2 weeks. Contact us if she does not hear from them by then. In the meantime, have colonoscopy as planned. Follow up here after she has been evaluated by hematology.   Benjiman CoreBrittany Deaisa Merida PA-C  [facility]  [facility] 02/04/2017 2:00 PM

## 2017-02-13 ENCOUNTER — Telehealth: Payer: Self-pay | Admitting: Physician Assistant

## 2017-02-13 NOTE — Telephone Encounter (Signed)
[facility] in [place] will be contacting pt to schedule today or tomorrow..Marland Kitchen

## 2017-02-13 NOTE — Telephone Encounter (Signed)
Copied from CRM #[id number]. Topic: Quick Communication - See Telephone Encounter >> Feb 13, 2017 11:16 AM Clack, Princella PellegriniJessica D wrote: CRM for notification. See Telephone encounter for: Patient was calling to check and see if Benjiman CoreBrittany Wiseman has received her medical records for her mammogram. Also patient wanted to check the status of her referral. Best contact number for patient [phone]  02/13/17.

## 2017-02-13 NOTE — Telephone Encounter (Signed)
Hello I cannot actually see the mammogram results.  I see that she had a [facility] in care everywhere but I cannot actually see the results of the mammogram. Maybe we could contact them for these results?

## 2017-02-13 NOTE — Telephone Encounter (Signed)
Okay I will contact them either tomorrow or Friday and ask them to send over her results.  Thank you!!

## 2017-02-13 NOTE — Telephone Encounter (Signed)
Hey we emailed [facility] in [place] on 02/04/17 and havent heard anything back I will be giving them a call and see if they can get pt scheduled

## 2017-02-13 NOTE — Telephone Encounter (Signed)
Hey GrenadaBrittany, do you recall seeing this pt's mammogram results?  If not I can call and have them sent over here.  I'm not sure about the status of the referral.  Pattricia BossAnnie or Malachi BondsGloria, have ya'll heard anything back from them?

## 2017-03-08 ENCOUNTER — Ambulatory Visit (INDEPENDENT_AMBULATORY_CARE_PROVIDER_SITE_OTHER): Payer: Medicare HMO | Admitting: Gastroenterology

## 2017-03-08 ENCOUNTER — Encounter: Payer: Self-pay | Admitting: Gastroenterology

## 2017-03-08 VITALS — BP 118/74 | HR 78 | Ht 69.0 in | Wt 194.0 lb

## 2017-03-08 DIAGNOSIS — D649 Anemia, unspecified: Secondary | ICD-10-CM | POA: Diagnosis not present

## 2017-03-08 NOTE — Progress Notes (Addendum)
[facility] Consult Note:  History: Jeanette Ellis 03/08/2017  Referring physician: Magdalene RiverWiseman, Brittany D, PA-C  Reason for consult/chief complaint: Anemia (Pt denies seeing blood in stool. Admits to some fatigue but symptom of fatigue has improved since the summer. Last colonoscopy was within 10 years ago by Dr. Loreta AveMann? Normal per pt.)   Subjective  HPI:  This is a 66 year old woman referred to me for anemia and consideration of colonoscopy.  She reports having been anemic in the past, no records from her former primary care doctor are currently available.  I do have blood work in the epic system from June 2010 showing hemoglobin of 11.3.  She saw a [facility] primary care provider in August, and was noted to be anemic with a normal MCV.  Iron levels were normal at that time.  Curiously, she had a CBC on September 26 with a hemoglobin of 7.5 and MCV of 82, but 2 days later hemoglobin was 10.2.  She was referred to hematology but for some reason had not yet been able to see them.  She reports having an appointment with them next week.  With this anemia she was referred to see us because it was believed she had not had a colonoscopy for perhaps 10 years. Roddie McYvella denies change in bowel habits, rectal bleeding, nausea, vomiting, early satiety, weight loss.  We have reports from a colonoscopy with Dr.Mann in September 2002, and the patient believes she had a subsequent colonoscopy with that provider as well.  We do not have those reports but have made the request.   ROS:  Review of Systems  Constitutional: Negative for appetite change and unexpected weight change.  HENT: Negative for mouth sores and voice change.   Eyes: Negative for pain and redness.  Respiratory: Negative for cough and shortness of breath.   Cardiovascular: Negative for chest pain and palpitations.  Genitourinary: Negative for dysuria and hematuria.  Musculoskeletal: Positive for arthralgias and  back pain. Negative for myalgias.  Skin: Negative for pallor and rash.  Neurological: Negative for weakness and headaches.  Hematological: Negative for adenopathy.    She does not have a chronic cough or dyspnea from her sarcoidosis  Past Medical History: Past Medical History:  Diagnosis Date  . Diabetes mellitus without complication (HCC)   . High cholesterol   . Hypertension   . Sarcoidosis      Past Surgical History: Past Surgical History:  Procedure Laterality Date  . ABDOMINAL HYSTERECTOMY       Family History: Family History  Problem Relation Age of Onset  . High blood pressure Mother   . Diabetes Father   no CRC   Social History: Social History   Socioeconomic History  . Marital status: Married    Spouse name: None  . Number of children: None  . Years of education: None  . Highest education level: None  Social Needs  . Financial resource strain: None  . Food insecurity - worry: None  . Food insecurity - inability: None  . Transportation needs - medical: None  . Transportation needs - non-medical: None  Occupational History  . None  Tobacco Use  . Smoking status: Never Smoker  . Smokeless tobacco: Never Used  Substance and Sexual Activity  . Alcohol use: No  . Drug use: No  . Sexual activity: No  Other Topics Concern  . None  Social History Narrative  . None    Allergies: No Known Allergies  Outpatient Meds: Current  Outpatient Medications  Medication Sig Dispense Refill  . aspirin EC 81 MG tablet Take 81 mg by mouth.    Marland Kitchen atorvastatin (LIPITOR) 20 MG tablet TAKE ONE TABLET BY MOUTH ONCE DAILY    . ferrous sulfate 325 (65 FE) MG EC tablet Take 325 mg by mouth once.     . Insulin Glargine (TOUJEO SOLOSTAR) 300 UNIT/ML SOPN Inject 25 Units into the skin daily. 4.5 mL 0  . Insulin Pen Needle 31G X 6 MM MISC 1 Units by Does not apply route once a week. 100 each 0  . Insulin Syringe-Needle U-100 (INSULIN SYRINGE .3CC/31GX5/16") 31G X 5/16" 0.3  ML MISC Use with Toujeo pen once a day 100 each 1  . lisinopril (PRINIVIL,ZESTRIL) 10 MG tablet     . metFORMIN (GLUCOPHAGE) 1000 MG tablet Take 1,000 mg by mouth 2 (two) times daily with a meal.    . Multiple Vitamin (MULTIVITAMIN WITH MINERALS) TABS tablet Take 1 tablet by mouth daily.     No current facility-administered medications for this visit.       ___________________________________________________________________ Objective   Exam:  BP 118/74   Pulse 78   Ht 5\' 9"  (1.753 m)   Wt 194 lb (88 kg)   BMI 28.65 kg/m    General: this is a(n) well-appearing woman  Eyes: sclera anicteric, no redness  ENT: oral mucosa moist without lesions, no cervical or supraclavicular lymphadenopathy, good dentition  CV: RRR without murmur, S1/S2, no JVD, no peripheral edema  Resp: clear to auscultation bilaterally, normal RR and effort noted  GI: soft, no tenderness, with active bowel sounds. No guarding or palpable organomegaly noted.  Skin; warm and dry, no rash or jaundice noted  Neuro: awake, alert and oriented x 3. Normal gross motor function and fluent speech  Labs:  CBC Latest Ref Rng & Units 01/04/2017 01/04/2017 01/02/2017  WBC 3.4 - 10.8 x10E3/uL 4.2 4.2(A) 4.8  Hemoglobin 11.1 - 15.9 g/dL 10.1(L) 10.2(A) 7.5(L)  Hematocrit 34.0 - 46.6 % 30.4(L) 31.5(A) 22.5(L)  Platelets 150 - 379 x10E3/uL 270 - 293  MCV                                                                 79                                  83                                82  Another Hgb also on 01/02/17 was 9.7  ??  11/28/16  Hgb 10.7  mcv 83   CMP Latest Ref Rng & Units 11/28/2016 10/06/2008  Glucose 65 - 99 mg/dL 161(W) 960(A)  BUN 8 - 27 mg/dL 24 14  Creatinine 5.40 - 1.00 mg/dL 9.81 0.7  Sodium 191 - 144 mmol/L 142 138  Potassium 3.5 - 5.2 mmol/L 5.0 3.8  Chloride 96 - 106 mmol/L 101 102  CO2 20 - 29 mmol/L 23 -  Calcium 8.7 - 10.3 mg/dL 47.8 -  Total Protein 6.0 - 8.5 g/dL 7.9 -  Total Bilirubin  0.0 - 1.2 mg/dL 0.3 -  Alkaline Phos 39 - 117 IU/L 85 -  AST 0 - 40 IU/L 18 -  ALT 0 - 32 IU/L 11 -   Iron/TIBC/Ferritin/ %Sat    Component Value Date/Time   IRON 52 01/02/2017 1508   TIBC 280 01/02/2017 1508   FERRITIN 147 01/04/2017 1108   IRONPCTSAT 19 01/02/2017 1508    Assessment: Encounter Diagnosis  Name Primary?  Marland Kitchen. Anemia, unspecified type Yes    This anemia does not appear to be from iron deficiency.  It also seems that it may be more chronic than originally apparent.  In light of that, I am not certain she is in need of a colonoscopy at this point. I I agree entirely she needs a hematology evaluation.  Plan:  Stool cards for occult blood We had her sign a release to get the most recent colonoscopy report from Dr.Mann.  We will contact her with the results and proceed accordingly.  Thank you for the courtesy of this consult.  Please call me with any questions or concerns.  Charlie PitterHenry L Danis III  CC: Magdalene RiverWiseman, Brittany D, PA-C   Addendum: Dr. Loreta AveMann records  Colonoscopy 11/10/2012 with sub cm hyperplastic and adenomatous polyps. Recall surveillance colonoscopy 5 yr interval (August 2019)  - HD

## 2017-03-08 NOTE — Patient Instructions (Signed)
If you are age 68 or older, your body mass index should be between 23-30. Your Body mass index is 28.65 kg/m. If this is out of the aforementioned range listed, please consider follow up with your Primary Care Provider.  If you are age 864 or younger, your body mass index should be between 19-25. Your Body mass index is 28.65 kg/m. If this is out of the aformentioned range listed, please consider follow up with your Primary Care Provider.   Please complete and mail the stool cards.  Thank you for choosing [facility]  Dr Amada JupiterHenry Danis III

## 2017-03-11 ENCOUNTER — Other Ambulatory Visit: Payer: Self-pay

## 2017-03-11 ENCOUNTER — Other Ambulatory Visit (HOSPITAL_BASED_OUTPATIENT_CLINIC_OR_DEPARTMENT_OTHER): Payer: Medicare HMO

## 2017-03-11 ENCOUNTER — Ambulatory Visit (HOSPITAL_BASED_OUTPATIENT_CLINIC_OR_DEPARTMENT_OTHER): Payer: Medicare HMO | Admitting: Hematology & Oncology

## 2017-03-11 ENCOUNTER — Telehealth: Payer: Self-pay | Admitting: Physician Assistant

## 2017-03-11 ENCOUNTER — Encounter: Payer: Self-pay | Admitting: Hematology & Oncology

## 2017-03-11 DIAGNOSIS — D649 Anemia, unspecified: Secondary | ICD-10-CM

## 2017-03-11 DIAGNOSIS — D631 Anemia in chronic kidney disease: Secondary | ICD-10-CM

## 2017-03-11 DIAGNOSIS — N183 Chronic kidney disease, stage 3 unspecified: Secondary | ICD-10-CM

## 2017-03-11 DIAGNOSIS — Z90710 Acquired absence of both cervix and uterus: Secondary | ICD-10-CM | POA: Diagnosis not present

## 2017-03-11 DIAGNOSIS — E119 Type 2 diabetes mellitus without complications: Secondary | ICD-10-CM | POA: Diagnosis not present

## 2017-03-11 DIAGNOSIS — D509 Iron deficiency anemia, unspecified: Secondary | ICD-10-CM

## 2017-03-11 DIAGNOSIS — D500 Iron deficiency anemia secondary to blood loss (chronic): Secondary | ICD-10-CM

## 2017-03-11 HISTORY — DX: Chronic kidney disease, stage 3 unspecified: N18.30

## 2017-03-11 HISTORY — DX: Anemia in chronic kidney disease: D63.1

## 2017-03-11 HISTORY — DX: Iron deficiency anemia, unspecified: D50.9

## 2017-03-11 LAB — CMP (CANCER CENTER ONLY)
ALT(SGPT): 26 U/L (ref 10–47)
AST: 26 U/L (ref 11–38)
Albumin: 3.4 g/dL (ref 3.3–5.5)
Alkaline Phosphatase: 86 U/L — ABNORMAL HIGH (ref 26–84)
BUN, Bld: 14 mg/dL (ref 7–22)
CO2: 26 mEq/L (ref 18–33)
Calcium: 9.4 mg/dL (ref 8.0–10.3)
Chloride: 100 mEq/L (ref 98–108)
Creat: 0.7 mg/dl (ref 0.6–1.2)
Glucose, Bld: 222 mg/dL — ABNORMAL HIGH (ref 73–118)
Potassium: 4.2 mEq/L (ref 3.3–4.7)
Sodium: 141 mEq/L (ref 128–145)
Total Bilirubin: 0.7 mg/dl (ref 0.20–1.60)
Total Protein: 7.8 g/dL (ref 6.4–8.1)

## 2017-03-11 LAB — CBC WITH DIFFERENTIAL (CANCER CENTER ONLY)
BASO#: 0 10*3/uL (ref 0.0–0.2)
BASO%: 0.4 % (ref 0.0–2.0)
EOS%: 6.3 % (ref 0.0–7.0)
Eosinophils Absolute: 0.3 10*3/uL (ref 0.0–0.5)
HCT: 33.4 % — ABNORMAL LOW (ref 34.8–46.6)
HGB: 10.7 g/dL — ABNORMAL LOW (ref 11.6–15.9)
LYMPH#: 1.3 10*3/uL (ref 0.9–3.3)
LYMPH%: 25 % (ref 14.0–48.0)
MCH: 27.1 pg (ref 26.0–34.0)
MCHC: 32 g/dL (ref 32.0–36.0)
MCV: 85 fL (ref 81–101)
MONO#: 0.4 10*3/uL (ref 0.1–0.9)
MONO%: 8.1 % (ref 0.0–13.0)
NEUT#: 3.2 10*3/uL (ref 1.5–6.5)
NEUT%: 60.2 % (ref 39.6–80.0)
Platelets: 219 10*3/uL (ref 145–400)
RBC: 3.95 10*6/uL (ref 3.70–5.32)
RDW: 12.6 % (ref 11.1–15.7)
WBC: 5.3 10*3/uL (ref 3.9–10.0)

## 2017-03-11 LAB — CHCC SATELLITE - SMEAR

## 2017-03-11 NOTE — Telephone Encounter (Signed)
Copied from CRM [phone]. Topic: Quick Communication - See Telephone Encounter >> Mar 11, 2017  4:14 PM Cipriano BunkerLambe, Annette S wrote: CRM for notification. See Telephone encounter for:   Ins. [employer]. [employer] won't cover Invokamet and Trujeo anymore and patient wants you to prescribe Tier ! Or Tier 2 for diabetes. Call home first Don't leave message if not at home,  call cell [phone]  03/11/17.

## 2017-03-11 NOTE — Telephone Encounter (Signed)
Pt stating [employer] insurance will not cover Invokamet and Trujeo anymore and would like to be prescribed another medication.

## 2017-03-11 NOTE — Progress Notes (Signed)
Referral MD  Reason for Referral: Normochromic and normocytic anemia  Chief Complaint  Patient presents with  . Follow-up  : I am anemic.  HPI: [name] is a very nice 66 year old African-American female.  She has a long-standing history of diabetes.  She is insulin dependent.  She has had diabetes for about 20 years.  She has had her gallbladder taken out.  She has had a hysterectomy.  There is no sickle cell in the family.  There is no history of any blood problems in the family.  She has been found to be anemic.  She is currently referred to the [facility] for an evaluation.  The anemia is not new.  Going back to 2010, her hemoglobin was 11.6 with a hematocrit of 34.  In August of this year, a CBC was done which showed a white cell count of 5.1.  Hemoglobin 10.7.  Platelet count 361,000.  Her MCV was 83.  In September she had iron studies done.  Her ferritin was 149.  Her iron saturation was only 19%.  She does have a history of sarcoidosis.  She is on no treatments for this.  She has her mammogram yearly.  She had a mammogram back in August.  All this looked fine.  She thinks her last colonoscopy was about 6 or 7 years ago.  She does not chew ice.  She has had no mouth sores.  She has had no issues with bleeding.  There is no bruising.  She has had no rashes.  She has had no nausea or vomiting.  She is not a vegetarian.  She does not smoke.  She does not drink.  She is currently retired.  She has no obvious occupational exposures.    Currently, her performance status is ECOG 0.    Past Medical History:  Diagnosis Date  . Anemia associated with stage 3 chronic renal failure (HCC) 03/11/2017  . Diabetes mellitus without complication (HCC)   . High cholesterol   . Hypertension   . Iron deficiency anemia 03/11/2017  . Sarcoidosis   :  Past Surgical History:  Procedure Laterality Date  . ABDOMINAL HYSTERECTOMY    :   Current Outpatient  Medications:  .  aspirin EC 81 MG tablet, Take 81 mg by mouth., Disp: , Rfl:  .  atorvastatin (LIPITOR) 20 MG tablet, TAKE ONE TABLET BY MOUTH ONCE DAILY, Disp: , Rfl:  .  ferrous sulfate 325 (65 FE) MG EC tablet, Take 325 mg by mouth once. , Disp: , Rfl:  .  Insulin Glargine (TOUJEO SOLOSTAR) 300 UNIT/ML SOPN, Inject 25 Units into the skin daily., Disp: 4.5 mL, Rfl: 0 .  Insulin Pen Needle 31G X 6 MM MISC, 1 Units by Does not apply route once a week., Disp: 100 each, Rfl: 0 .  Insulin Syringe-Needle U-100 (INSULIN SYRINGE .3CC/31GX5/16") 31G X 5/16" 0.3 ML MISC, Use with Toujeo pen once a day, Disp: 100 each, Rfl: 1 .  lisinopril (PRINIVIL,ZESTRIL) 10 MG tablet, , Disp: , Rfl:  .  metFORMIN (GLUCOPHAGE) 1000 MG tablet, Take 1,000 mg by mouth 2 (two) times daily with a meal., Disp: , Rfl:  .  Multiple Vitamin (MULTIVITAMIN WITH MINERALS) TABS tablet, Take 1 tablet by mouth daily., Disp: , Rfl: :  :  No Known Allergies:  Family History  Problem Relation Age of Onset  . High blood pressure Mother   . Diabetes Father   :  Social History   Socioeconomic  History  . Marital status: Married    Spouse name: Not on file  . Number of children: Not on file  . Years of education: Not on file  . Highest education level: Not on file  Social Needs  . Financial resource strain: Not on file  . Food insecurity - worry: Not on file  . Food insecurity - inability: Not on file  . Transportation needs - medical: Not on file  . Transportation needs - non-medical: Not on file  Occupational History  . Not on file  Tobacco Use  . Smoking status: Never Smoker  . Smokeless tobacco: Never Used  Substance and Sexual Activity  . Alcohol use: No  . Drug use: No  . Sexual activity: No  Other Topics Concern  . Not on file  Social History Narrative  . Not on file  :  Pertinent items are noted in HPI.  Exam: Well-developed and well-nourished African-American female in no obvious distress.  Vital  signs show temperature of 98.2.  Pulse 83.  Blood pressure 118/66.  Weight is 195 pounds.  Head neck exam shows no ocular or oral lesions.  There are no palpable cervical or supraclavicular lymph nodes.  Lungs are clear bilaterally.  Cardiac exam regular rate and rhythm with no murmurs, rubs or bruits.  Axillary exam shows no bilateral axillary adenopathy.  Abdomen is soft.  She has good bowel sounds.  There is no fluid wave.  There is no palpable abdominal mass.  There is no palpable liver or spleen tip.  Back exam shows no tenderness over the spine, ribs or hips.  Extremities shows no clubbing, cyanosis or edema.  Neurological exam shows no focal neurological deficits.  Skin exam shows no rashes, ecchymoses or petechia.   Recent Labs    03/11/17 1409  WBC 5.3  HGB 10.7*  HCT 33.4*  PLT 219   Recent Labs    03/11/17 1409  NA 141  K 4.2  CL 100  CO2 26  GLUCOSE 222*  BUN 14  CREATININE 0.7  CALCIUM 9.4    Blood smear review: Normochromic and normocytic population of red blood cells.  There may be some slight anisocytosis.  She has no target cells.  She has no nucleated red blood cells.  There are no rouleaux formation.  She has no teardrop cells.  I see no inclusion bodies.  White blood cells show no hypersegmented polys.  She has good maturity of her white blood cells.  She has no immature myeloid or lymphoid cells.  Platelets are adequate in number and size.  Platelets are well granulated.  Pathology: None    Assessment and Plan: [name] is a very charming 66 year old African-American female.  She has mild anemia.  She has a normochromic and normocytic anemia.  I have to believe that [name] is going to have a low erythropoietin level.  She has long-standing diabetes.  As such, we often see low erythropoietin levels in patients with diabetes.  She also may have some iron deficiency.  She is on some oral iron.  I am not sure how well this will be absorbed.  I do not see  any obvious hematologic malignancy.  I do not think that she has myelodysplasia.  I do not see anything that looks like leukemia, myeloma or lymphoma.  I think that if we find her erythropoietin level to be low, then we will see about giving her Aranesp.  I talked to her about this.  I explained why Aranesp works.  I explained how Aranesp works.  It may be that she is just iron deficient.  If so, we will give her some IV iron.  I spent about 45 minutes with her.  I reviewed all of her lab work.  She and I had very good fellowship.  She has a very strong faith.  We will likely get her back in a week or so for either Aranesp or iron.

## 2017-03-12 LAB — IRON AND TIBC
%SAT: 26 % (ref 21–57)
Iron: 76 ug/dL (ref 41–142)
TIBC: 298 ug/dL (ref 236–444)
UIBC: 221 ug/dL (ref 120–384)

## 2017-03-12 LAB — ERYTHROPOIETIN: Erythropoietin: 12.7 m[IU]/mL (ref 2.6–18.5)

## 2017-03-12 LAB — KAPPA/LAMBDA LIGHT CHAINS
Ig Kappa Free Light Chain: 38.4 mg/L — ABNORMAL HIGH (ref 3.3–19.4)
Ig Lambda Free Light Chain: 20.2 mg/L (ref 5.7–26.3)
Kappa/Lambda FluidC Ratio: 1.9 — ABNORMAL HIGH (ref 0.26–1.65)

## 2017-03-12 LAB — IGG, IGA, IGM
IgA, Qn, Serum: 374 mg/dL — ABNORMAL HIGH (ref 87–352)
IgG, Qn, Serum: 1843 mg/dL — ABNORMAL HIGH (ref 700–1600)
IgM, Qn, Serum: 72 mg/dL (ref 26–217)

## 2017-03-12 LAB — FERRITIN: Ferritin: 133 ng/ml (ref 9–269)

## 2017-03-13 ENCOUNTER — Ambulatory Visit: Payer: Medicare HMO | Admitting: Physician Assistant

## 2017-03-13 LAB — HGB FRAC. W/SOLUBILITY
HGB A2: 2.1 % (ref 1.8–3.2)
HGB A: 97.9 % (ref 96.4–98.8)
HGB C: 0 %
HGB F: 0 % (ref 0.0–2.0)
HGB Variant: 0 %
Hgb S: 0 %
Hgb Solubility: NEGATIVE

## 2017-03-14 LAB — PROTEIN ELECTROPHORESIS, SERUM
A/G Ratio: 0.8 (ref 0.7–1.7)
Albumin: 3.2 g/dL (ref 2.9–4.4)
Alpha 1: 0.2 g/dL (ref 0.0–0.4)
Alpha 2: 0.8 g/dL (ref 0.4–1.0)
Beta: 1.2 g/dL (ref 0.7–1.3)
Gamma Globulin: 1.9 g/dL — ABNORMAL HIGH (ref 0.4–1.8)
Globulin, Total: 4.1 g/dL — ABNORMAL HIGH (ref 2.2–3.9)
Total Protein: 7.3 g/dL (ref 6.0–8.5)

## 2017-03-14 NOTE — Telephone Encounter (Signed)
Please call pt and have her come in so we can discuss medication changes. She should also contact her insurance or pharmacy and see which medications they are willing to cover that are similar as I can not see it in the system at this time. Thanks!

## 2017-03-19 NOTE — Telephone Encounter (Signed)
Pt has coverage info.  Scheduling a med refill appt to review options with provider.

## 2017-03-20 ENCOUNTER — Other Ambulatory Visit: Payer: Self-pay | Admitting: Physician Assistant

## 2017-03-20 MED ORDER — FERROUS SULFATE 325 (65 FE) MG PO TBEC: 325.0000 mg | DELAYED_RELEASE_TABLET | Freq: Once | ORAL | 0 refills | Status: DC

## 2017-03-20 MED ORDER — ATORVASTATIN CALCIUM 20 MG PO TABS: 20.0000 mg | ORAL_TABLET | Freq: Every day | ORAL | 1 refills | Status: DC

## 2017-03-20 MED ORDER — LISINOPRIL 10 MG PO TABS: 10.0000 mg | ORAL_TABLET | Freq: Every day | ORAL | 1 refills | Status: DC

## 2017-03-20 NOTE — Telephone Encounter (Signed)
Copied from CRM [phone]. Topic: Quick Communication - See Telephone Encounter >> Mar 20, 2017  9:14 AM Diana EvesHoyt, Maryann B wrote: CRM for notification. See Telephone encounter for:  Pt needing refill on lisinopril, atorvastatin and ferrous sulfate.   03/20/17.

## 2017-03-21 ENCOUNTER — Telehealth: Payer: Self-pay | Admitting: Physician Assistant

## 2017-03-21 ENCOUNTER — Ambulatory Visit: Payer: Medicare HMO | Admitting: Physician Assistant

## 2017-03-21 NOTE — Telephone Encounter (Signed)
Copied from CRM [id number]. Topic: Quick Communication - Appointment Cancellation >> Mar 21, 2017 11:02 AM Stephannie LiSimmons, Jhonathan Desroches L, NT wrote: Patient called to cancel appointment scheduled for today  at 11:20 Patient HAS rescheduled their appointment.for Monday  at 11:20  Route to department's PEC pool.

## 2017-03-25 ENCOUNTER — Ambulatory Visit (INDEPENDENT_AMBULATORY_CARE_PROVIDER_SITE_OTHER): Payer: PPO | Admitting: Physician Assistant

## 2017-03-25 ENCOUNTER — Other Ambulatory Visit: Payer: Self-pay

## 2017-03-25 ENCOUNTER — Encounter: Payer: Self-pay | Admitting: Physician Assistant

## 2017-03-25 VITALS — BP 130/62 | HR 86 | Temp 98.0°F | Resp 18 | Ht 68.03 in | Wt 198.8 lb

## 2017-03-25 DIAGNOSIS — E119 Type 2 diabetes mellitus without complications: Secondary | ICD-10-CM | POA: Diagnosis not present

## 2017-03-25 DIAGNOSIS — Z794 Long term (current) use of insulin: Secondary | ICD-10-CM | POA: Diagnosis not present

## 2017-03-25 DIAGNOSIS — I10 Essential (primary) hypertension: Secondary | ICD-10-CM | POA: Diagnosis not present

## 2017-03-25 DIAGNOSIS — E785 Hyperlipidemia, unspecified: Secondary | ICD-10-CM | POA: Diagnosis not present

## 2017-03-25 LAB — POCT URINALYSIS DIP (MANUAL ENTRY)
Bilirubin, UA: NEGATIVE
Glucose, UA: >=1000 mg/dL — AB
Ketones, POC UA: NEGATIVE mg/dL
Nitrite, UA: NEGATIVE
Protein Ur, POC: NEGATIVE mg/dL
Spec Grav, UA: 1.02 (ref 1.010–1.025)
Urobilinogen, UA: 0.2 E.U./dL
pH, UA: 5.5 (ref 5.0–8.0)

## 2017-03-25 LAB — GLUCOSE, POCT (MANUAL RESULT ENTRY): POC Glucose: 369 mg/dl — AB (ref 70–99)

## 2017-03-25 LAB — POCT GLYCOSYLATED HEMOGLOBIN (HGB A1C): Hemoglobin A1C: 9

## 2017-03-25 MED ORDER — INSULIN PEN NEEDLE 31G X 6 MM MISC: 1.0000 [IU] | 0 refills | Status: DC

## 2017-03-25 MED ORDER — INSULIN GLARGINE 300 UNIT/ML ~~LOC~~ SOPN
25.0000 [IU] | PEN_INJECTOR | Freq: Every day | SUBCUTANEOUS | 0 refills | Status: DC
Start: 2017-03-25 — End: 2017-08-02

## 2017-03-25 MED ORDER — ATORVASTATIN CALCIUM 20 MG PO TABS: 20.0000 mg | ORAL_TABLET | Freq: Every day | ORAL | 1 refills | Status: DC

## 2017-03-25 MED ORDER — "INSULIN SYRINGE 31G X 5/16"" 0.3 ML MISC": 1 refills | Status: DC

## 2017-03-25 MED ORDER — LISINOPRIL 10 MG PO TABS: 10.0000 mg | ORAL_TABLET | Freq: Every day | ORAL | 1 refills | Status: DC

## 2017-03-25 NOTE — Patient Instructions (Addendum)
  Start invokamet today. You can pick that up today. Also increase insulin from 25 units to 30 units daily. Check fasting blood sugars daily. Goal is 90-110. If you go below 90, contact our office. I also strongly recommend decreasing carbs and sugar intake. Follow up at the first of the year for reevaluation.    IF you received an x-ray today, you will receive an invoice from [facility]. Please contact [facility] at [phone] with questions or concerns regarding your invoice.   IF you received labwork today, you will receive an invoice from [facility]. Please contact [facility] at [phone] with questions or concerns regarding your invoice.   Our billing staff will not be able to assist you with questions regarding bills from these companies.  You will be contacted with the lab results as soon as they are available. The fastest way to get your results is to activate your My Chart account. Instructions are located on the last page of this paperwork. If you have not heard from us regarding the results in 2 weeks, please contact this office.

## 2017-03-25 NOTE — Progress Notes (Signed)
MRN: [id number]  Subjective:   Jeanette Ellis is a 66 y.o. female who presents for follow up of Type 2 diabetes mellitus.  Patient just ate before office visit.  Diagnosis was made 30+ years ago. Patient is currently managed with 25 units of toujeo and metformin 1014m daily. Was given Rx for invokamet 150-10055mBID at her last visit with me in 11/2016 but never picked it up due to cost. Admits fair compliance. Denies adverse effects including metallic taste, hypoglycemia, nausea, vomiting. Has been on insulin for 1-2 years. However, her insurance is no longer covering her diabetes medication in 2019 so she needs Rx for new medication at that point.   Patient is checking home blood sugars. Home blood sugar records: BGs range between 140 and 200. Current symptoms include intermittent paresthesia of the feet.  This mostly occurs at the end of the day and is not every day.  Patient denies foot ulcerations, increased appetite, nausea, polydipsia, polyuria, visual disturbances, vomiting and weight loss. Patient is checking their feet daily. No foot concerns. Last diabetic eye exam eye exam was 3 weeks ago, negative for retinopathy. Has another in office visit scheduled for next week.   Diet has not been that good lately. Eating more carbs since the holidays. Tries to walk frequently but that has been limited too due to the weather.  Denies smoking or alcohol use.  She did follow-up with diabetic nutritionist but notes that it was not really helpful.  Known diabetic complications: none  Immunizations: Flu vaccine: declines, pneumococal vaccines:completed in 2017    Objective:   PHYSICAL EXAM BP 130/62 (BP Location: Left Arm, Patient Position: Sitting, Cuff Size: Large)   Pulse 86   Temp 98 F (36.7 C) (Oral)   Resp 18   Ht 5' 8.03" (1.728 m)   Wt 198 lb 12.8 oz (90.2 kg)   SpO2 98%   BMI 30.20 kg/m   Physical Exam  Constitutional: She is oriented to person, place, and time. She  appears well-developed and well-nourished.  HENT:  Head: Normocephalic and atraumatic.  Mouth/Throat: Uvula is midline, oropharynx is clear and moist and mucous membranes are normal.  Eyes: Conjunctivae are normal. Pupils are equal, round, and reactive to light.  Neck: Normal range of motion.  Pulmonary/Chest: Effort normal.  Abdominal: Soft. Normal appearance. There is no tenderness.  Neurological: She is alert and oriented to person, place, and time.  Skin: Skin is warm and dry.  Psychiatric: She has a normal mood and affect.  Vitals reviewed.   Diabetic Foot Exam - Simple   Simple Foot Form Visual Inspection No deformities, no ulcerations, no other skin breakdown bilaterally:  Yes Sensation Testing Intact to touch and monofilament testing bilaterally:  Yes Pulse Check Posterior Tibialis and Dorsalis pulse intact bilaterally:  Yes Comments     Results for orders placed or performed in visit on 03/25/17 (from the past 24 hour(s))  POCT urinalysis dipstick     Status: Abnormal   Collection Time: 03/25/17 12:10 PM  Result Value Ref Range   Color, UA yellow yellow   Clarity, UA cloudy (A) clear   Glucose, UA >=1,000 (A) negative mg/dL   Bilirubin, UA negative negative   Ketones, POC UA negative negative mg/dL   Spec Grav, UA 1.020 1.010 - 1.025   Blood, UA trace-intact (A) negative   pH, UA 5.5 5.0 - 8.0   Protein Ur, POC negative negative mg/dL   Urobilinogen, UA 0.2 0.2 or 1.0 E.U./dL  Nitrite, UA Negative Negative   Leukocytes, UA Small (1+) (A) Negative  POCT glycosylated hemoglobin (Hb A1C)     Status: Abnormal   Collection Time: 03/25/17 12:26 PM  Result Value Ref Range   Hemoglobin A1C 9.0   Glucose (CBG)     Status: Abnormal   Collection Time: 03/25/17  1:01 PM  Result Value Ref Range   POC Glucose 369 (A) 70 - 99 mg/dl    Assessment and Plan :  This case was precepted with Dr. Tamala Julian. 1. Type 2 diabetes mellitus without complication, with long-term current  use of insulin (HCC) - POCT glycosylated hemoglobin (Hb A1C) - POCT urinalysis dipstick - CMP14+EGFR - Glucose (CBG) - Insulin Glargine (TOUJEO SOLOSTAR) 300 UNIT/ML SOPN; Inject 25 Units into the skin daily.  Dispense: 4.5 mL; Refill: 0 - Insulin Syringe-Needle U-100 (INSULIN SYRINGE .3CC/31GX5/16") 31G X 5/16" 0.3 ML MISC; Use with Toujeo pen once a day  Dispense: 100 each; Refill: 1 - Insulin Pen Needle 31G X 6 MM MISC; 1 Units by Does not apply route once a week.  Dispense: 100 each; Refill: 0 2. Essential hypertension Controlled at this time.  Continue with current medication. - CMP14+EGFR - lisinopril (PRINIVIL,ZESTRIL) 10 MG tablet; Take 1 tablet (10 mg total) by mouth daily.  Dispense: 90 tablet; Refill: 1 3. Hyperlipidemia, unspecified hyperlipidemia type - Lipid panel - atorvastatin (LIPITOR) 20 MG tablet; Take 1 tablet (20 mg total) by mouth daily.  Dispense: 90 tablet; Refill: 1  Diabetes is uncontrolled at this time.    A1c 9.0.  She has glucosuria.  No ketones in urine.  She is asymptomatic and well-appearing.  Patient notes she would like to continue with invokamet and Toujeo until the new year.  Educated her that a prescription for invokamet is waiting for her at her pharmacy.  We contacted her pharmacy to verify this.  Encouraged her to pick this up.  Will increase Toujeo from 25 units/day to 30 units/day.   Instructed patient to check fasting glucose daily.  Goal is 90-110.  Educated on symptoms of hypoglycemia.  Encouraged patient that if her fasting blood sugar drops below 90 to contact our office.  Return in 2 weeks for reevaluation.  Will check fasting glucose and UA at this visit.  Will likely change medications at this time due to new insurance starting at the new year.  Tenna Delaine, PA-C  [facility] 03/25/2017 6:23 PM

## 2017-03-26 LAB — CMP14+EGFR
ALT: 33 IU/L — ABNORMAL HIGH (ref 0–32)
AST: 32 IU/L (ref 0–40)
Albumin/Globulin Ratio: 1.1 — ABNORMAL LOW (ref 1.2–2.2)
Albumin: 4 g/dL (ref 3.6–4.8)
Alkaline Phosphatase: 87 IU/L (ref 39–117)
BUN/Creatinine Ratio: 17 (ref 12–28)
BUN: 14 mg/dL (ref 8–27)
Bilirubin Total: 0.5 mg/dL (ref 0.0–1.2)
CO2: 22 mmol/L (ref 20–29)
Calcium: 9.2 mg/dL (ref 8.7–10.3)
Chloride: 99 mmol/L (ref 96–106)
Creatinine, Ser: 0.82 mg/dL (ref 0.57–1.00)
GFR calc Af Amer: 85 mL/min/{1.73_m2} (ref >59)
GFR calc non Af Amer: 74 mL/min/{1.73_m2} (ref >59)
Globulin, Total: 3.7 g/dL (ref 1.5–4.5)
Glucose: 316 mg/dL — ABNORMAL HIGH (ref 65–99)
Potassium: 4 mmol/L (ref 3.5–5.2)
Sodium: 139 mmol/L (ref 134–144)
Total Protein: 7.7 g/dL (ref 6.0–8.5)

## 2017-03-26 LAB — LIPID PANEL
Chol/HDL Ratio: 3.1 ratio (ref 0.0–4.4)
Cholesterol, Total: 234 mg/dL — ABNORMAL HIGH (ref 100–199)
HDL: 76 mg/dL (ref >39)
LDL Calculated: 131 mg/dL — ABNORMAL HIGH (ref 0–99)
Triglycerides: 134 mg/dL (ref 0–149)
VLDL Cholesterol Cal: 27 mg/dL (ref 5–40)

## 2017-03-28 ENCOUNTER — Encounter: Payer: Self-pay | Admitting: Physician Assistant

## 2017-03-28 LAB — HM DIABETES EYE EXAM

## 2017-04-08 ENCOUNTER — Ambulatory Visit: Payer: Medicare HMO | Admitting: Physician Assistant

## 2017-04-11 ENCOUNTER — Telehealth: Payer: Self-pay | Admitting: Gastroenterology

## 2017-04-11 NOTE — Telephone Encounter (Signed)
Based on prior records received and reviewed, please put this patient in for surveillance colonoscopy (Hx colon polyps) August 2019.  - HD

## 2017-04-12 NOTE — Telephone Encounter (Signed)
Recall placed in Epic; 

## 2017-04-18 ENCOUNTER — Ambulatory Visit: Payer: Medicare HMO | Admitting: Physician Assistant

## 2017-04-24 ENCOUNTER — Ambulatory Visit (INDEPENDENT_AMBULATORY_CARE_PROVIDER_SITE_OTHER): Payer: PPO | Admitting: Physician Assistant

## 2017-04-24 ENCOUNTER — Encounter: Payer: Self-pay | Admitting: Physician Assistant

## 2017-04-24 ENCOUNTER — Other Ambulatory Visit: Payer: Self-pay

## 2017-04-24 VITALS — BP 110/60 | HR 80 | Temp 97.6°F | Resp 18 | Ht 68.11 in | Wt 192.8 lb

## 2017-04-24 DIAGNOSIS — E119 Type 2 diabetes mellitus without complications: Secondary | ICD-10-CM

## 2017-04-24 DIAGNOSIS — Z794 Long term (current) use of insulin: Secondary | ICD-10-CM | POA: Diagnosis not present

## 2017-04-24 LAB — POCT URINALYSIS DIP (MANUAL ENTRY)
Bilirubin, UA: NEGATIVE
Blood, UA: NEGATIVE
Glucose, UA: NEGATIVE mg/dL
Ketones, POC UA: NEGATIVE mg/dL
Nitrite, UA: NEGATIVE
Spec Grav, UA: 1.015 (ref 1.010–1.025)
Urobilinogen, UA: 0.2 E.U./dL
pH, UA: 5.5 (ref 5.0–8.0)

## 2017-04-24 LAB — GLUCOSE, POCT (MANUAL RESULT ENTRY): POC Glucose: 128 mg/dl — AB (ref 70–99)

## 2017-04-24 NOTE — Progress Notes (Signed)
Jeanette SodaYvella S Julson  MRN: [id number] DOB: 1951-04-04  Subjective:  Jeanette Ellis is a 67 y.o. female seen in office today for a chief complaint of f follow-up on T2DM.  Patient was last seen on 03/25/17.  Diabetes is uncontrolled at that time.  A1c was 9.  Point-of-care glucose in office was 369.  She had glucosuria without ketones.  She was asymptomatic and well-appearing.  Toujeo was increased from 25 units a day to 30 units a day and a prescription for invokamet was provided.  Had a lengthy discussion about lifestyle modifications.  Encouraged to return in 2-4 weeks for reevaluation.  At that time she was worried about her new insurance in the new year covering her diabetes medications.  Advised her we will discuss that at her follow-up visit.  Please refer to that office note for additional details.  Today, patient notes she has been doing very well.  She has changed her diet tremendously.  She has cut out all excess sweets, sodas, and juices and is limiting her carbs.  She had a diabetic eye exam and was told that it is showing early signs of complications that could be reversed if she lowered her A1c.  This motivated her to make a change.  Her insurance did not cover invokamet so she has been taking Metformin XR 1000 mg twice daily and Toujeo 30 units daily.  She is checking her fasting blood sugars daily.  She most commonly sees values in the 90-110 range.  Notes that she did a 1 week fast with her church and during that week did have some fasting blood sugars in the high 60s.  Notes she did feel a little lightheaded when this occurred but immediately ate something and felt better. Patient denies foot ulcerations, increased appetite, nausea, polydipsia, polyuria, visual disturbances, vomiting and weight loss. Patient is checking their feet daily. No foot concerns.   Review of Systems  Per HPI  Patient Active Problem List   Diagnosis Date Noted  . Iron deficiency anemia 03/11/2017  . Anemia  associated with stage 3 chronic renal failure (HCC) 03/11/2017  . History of sarcoidosis 06/09/2015  . Type 2 diabetes mellitus without complication, with long-term current use of insulin (HCC) 01/12/2014  . Essential hypertension 09/21/2013  . Hyperlipemia 09/21/2013    Current Outpatient Medications on File Prior to Visit  Medication Sig Dispense Refill  . aspirin EC 81 MG tablet Take 81 mg by mouth.    Marland Kitchen. atorvastatin (LIPITOR) 20 MG tablet Take 1 tablet (20 mg total) by mouth daily. 90 tablet 1  . ferrous sulfate 325 (65 FE) MG EC tablet Take 1 tablet (325 mg total) by mouth once for 1 dose. 1 tablet 0  . Insulin Glargine (TOUJEO SOLOSTAR) 300 UNIT/ML SOPN Inject 25 Units into the skin daily. 4.5 mL 0  . Insulin Pen Needle 31G X 6 MM MISC 1 Units by Does not apply route once a week. 100 each 0  . Insulin Syringe-Needle U-100 (INSULIN SYRINGE .3CC/31GX5/16") 31G X 5/16" 0.3 ML MISC Use with Toujeo pen once a day 100 each 1  . lisinopril (PRINIVIL,ZESTRIL) 10 MG tablet Take 1 tablet (10 mg total) by mouth daily. 90 tablet 1  . metFORMIN (GLUCOPHAGE-XR) 500 MG 24 hr tablet     . Multiple Vitamin (MULTIVITAMIN WITH MINERALS) TABS tablet Take 1 tablet by mouth daily.     No current facility-administered medications on file prior to visit.     No Known Allergies  Objective:  BP 110/60 (BP Location: Left Arm, Patient Position: Sitting, Cuff Size: Normal)   Pulse 80   Temp 97.6 F (36.4 C) (Oral)   Resp 18   Ht 5' 8.11" (1.73 m)   Wt 192 lb 12.8 oz (87.5 kg)   SpO2 96%   BMI 29.22 kg/m   Physical Exam  Constitutional: She is oriented to person, place, and time and well-developed, well-nourished, and in no distress.  HENT:  Head: Normocephalic and atraumatic.  Eyes: Conjunctivae are normal.  Neck: Normal range of motion.  Cardiovascular: Normal rate, regular rhythm and normal heart sounds.  Pulmonary/Chest: Effort normal.  Neurological: She is alert and oriented to person,  place, and time. Gait normal.  Skin: Skin is warm and dry.  Psychiatric: Affect normal.  Vitals reviewed.    Results for orders placed or performed in visit on 04/24/17 (from the past 24 hour(s))  POCT glucose (manual entry)     Status: Abnormal   Collection Time: 04/24/17 10:27 AM  Result Value Ref Range   POC Glucose 128 (A) 70 - 99 mg/dl  POCT urinalysis dipstick     Status: Abnormal   Collection Time: 04/24/17 10:59 AM  Result Value Ref Range   Color, UA yellow yellow   Clarity, UA cloudy (A) clear   Glucose, UA negative negative mg/dL   Bilirubin, UA negative negative   Ketones, POC UA negative negative mg/dL   Spec Grav, UA 1.914 7.829 - 1.025   Blood, UA negative negative   pH, UA 5.5 5.0 - 8.0   Protein Ur, POC trace (A) negative mg/dL   Urobilinogen, UA 0.2 0.2 or 1.0 E.U./dL   Nitrite, UA Negative Negative   Leukocytes, UA Trace (A) Negative     Assessment and Plan :  1. Type 2 diabetes mellitus without complication, with long-term current use of insulin (HCC) POC glucose today is 128.  UA with no glucose or ketones.  Patient congratulated on her lifestyle modifications as they are clearly having a positive impact on her diabetes control.  Encouraged her to continue with lifestyle modifications.  Due to better controlling sugars with diet, recommend continuing metformin XR 1000 mg twice daily and deeasing Toujeo dose to 20 units daily. Fasting blood sugar goal is 120. Given educational material on how to titrate Toujeo up if fasting blood sugars are not within range on 20 units daily.  Also educated on signs of hypoglycemia.  Recommended to contact our office if she develops any episodes of hypoglycemia.  Plan to follow-up in 2 months for reevaluation.  Will obtain A1c at that time. - POCT glucose (manual entry) - POCT urinalysis dipstick  Benjiman Core PA-C  [facility] 04/24/2017 11:01 AM

## 2017-04-24 NOTE — Patient Instructions (Addendum)
Your urine and glucose are much improved at this visit, which is great! Because you are controlling diabetes very well with diet, I recommend continuing with metformin 1000mg  twice daily and decrease toujeo to 20 units once daily. Continue to monitor your fasting blood sugars. Your goal is 120. If you are above 120, below is information on how to titrate your insulin to reach your goal:   Treat-to-Target Trial's Titration Schedule for Basal Insulin in Patients with Diabetes Mellitus Fasting glucose level Increase in basal insulin  130 to 140 mg per dL  2 units  478141 to 295160 mg per dL  4 units  621161 to 308180 mg per dL  6 units  > 657180 mg per dL  8 units   Plan to follow up in 2 months for recheck of A1C.    Diabetes Mellitus and Nutrition When you have diabetes (diabetes mellitus), it is very important to have healthy eating habits because your blood sugar (glucose) levels are greatly affected by what you eat and drink. Eating healthy foods in the appropriate amounts, at about the same times every day, can help you:  Control your blood glucose.  Lower your risk of heart disease.  Improve your blood pressure.  Reach or maintain a healthy weight.  Every person with diabetes is different, and each person has different needs for a meal plan. Your health care provider may recommend that you work with a diet and nutrition specialist (dietitian) to make a meal plan that is best for you. Your meal plan may vary depending on factors such as:  The calories you need.  The medicines you take.  Your weight.  Your blood glucose, blood pressure, and cholesterol levels.  Your activity level.  Other health conditions you have, such as heart or kidney disease.  How do carbohydrates affect me? Carbohydrates affect your blood glucose level more than any other type of food. Eating carbohydrates naturally increases the amount of glucose in your blood. Carbohydrate counting is a method for keeping track of  how many carbohydrates you eat. Counting carbohydrates is important to keep your blood glucose at a healthy level, especially if you use insulin or take certain oral diabetes medicines. It is important to know how many carbohydrates you can safely have in each meal. This is different for every person. Your dietitian can help you calculate how many carbohydrates you should have at each meal and for snack. Foods that contain carbohydrates include:  Bread, cereal, rice, pasta, and crackers.  Potatoes and corn.  Peas, beans, and lentils.  Milk and yogurt.  Fruit and juice.  Desserts, such as cakes, cookies, ice cream, and candy.  How does alcohol affect me? Alcohol can cause a sudden decrease in blood glucose (hypoglycemia), especially if you use insulin or take certain oral diabetes medicines. Hypoglycemia can be a life-threatening condition. Symptoms of hypoglycemia (sleepiness, dizziness, and confusion) are similar to symptoms of having too much alcohol. If your health care provider says that alcohol is safe for you, follow these guidelines:  Limit alcohol intake to no more than 1 drink per day for nonpregnant women and 2 drinks per day for men. One drink equals 12 oz of beer, 5 oz of wine, or 1 oz of hard liquor.  Do not drink on an empty stomach.  Keep yourself hydrated with water, diet soda, or unsweetened iced tea.  Keep in mind that regular soda, juice, and other mixers may contain a lot of sugar and must be counted  as carbohydrates.  What are tips for following this plan? Reading food labels  Start by checking the serving size on the label. The amount of calories, carbohydrates, fats, and other nutrients listed on the label are based on one serving of the food. Many foods contain more than one serving per package.  Check the total grams (g) of carbohydrates in one serving. You can calculate the number of servings of carbohydrates in one serving by dividing the total  carbohydrates by 15. For example, if a food has 30 g of total carbohydrates, it would be equal to 2 servings of carbohydrates.  Check the number of grams (g) of saturated and trans fats in one serving. Choose foods that have low or no amount of these fats.  Check the number of milligrams (mg) of sodium in one serving. Most people should limit total sodium intake to less than 2,300 mg per day.  Always check the nutrition information of foods labeled as "low-fat" or "nonfat". These foods may be higher in added sugar or refined carbohydrates and should be avoided.  Talk to your dietitian to identify your daily goals for nutrients listed on the label. Shopping  Avoid buying canned, premade, or processed foods. These foods tend to be high in fat, sodium, and added sugar.  Shop around the outside edge of the grocery store. This includes fresh fruits and vegetables, bulk grains, fresh meats, and fresh dairy. Cooking  Use low-heat cooking methods, such as baking, instead of high-heat cooking methods like deep frying.  Cook using healthy oils, such as olive, canola, or sunflower oil.  Avoid cooking with butter, cream, or high-fat meats. Meal planning  Eat meals and snacks regularly, preferably at the same times every day. Avoid going long periods of time without eating.  Eat foods high in fiber, such as fresh fruits, vegetables, beans, and whole grains. Talk to your dietitian about how many servings of carbohydrates you can eat at each meal.  Eat 4-6 ounces of lean protein each day, such as lean meat, chicken, fish, eggs, or tofu. 1 ounce is equal to 1 ounce of meat, chicken, or fish, 1 egg, or 1/4 cup of tofu.  Eat some foods each day that contain healthy fats, such as avocado, nuts, seeds, and fish. Lifestyle   Check your blood glucose regularly.  Exercise at least 30 minutes 5 or more days each week, or as told by your health care provider.  Take medicines as told by your health care  provider.  Do not use any products that contain nicotine or tobacco, such as cigarettes and e-cigarettes. If you need help quitting, ask your health care provider.  Work with a Veterinary surgeon or diabetes educator to identify strategies to manage stress and any emotional and social challenges. What are some questions to ask my health care provider?  Do I need to meet with a diabetes educator?  Do I need to meet with a dietitian?  What number can I call if I have questions?  When are the best times to check my blood glucose? Where to find more information:  American Diabetes Association: diabetes.org/food-and-fitness/food  Academy of Nutrition and Dietetics: https://www.vargas.com/  [employer] of Diabetes and Digestive and Kidney Diseases (NIH): FindJewelers.cz Summary  A healthy meal plan will help you control your blood glucose and maintain a healthy lifestyle.  Working with a diet and nutrition specialist (dietitian) can help you make a meal plan that is best for you.  Keep in mind that carbohydrates and  alcohol have immediate effects on your blood glucose levels. It is important to count carbohydrates and to use alcohol carefully. This information is not intended to replace advice given to you by your health care provider. Make sure you discuss any questions you have with your health care provider. Document Released: 12/21/2004 Document Revised: 04/30/2016 Document Reviewed: 04/30/2016 Elsevier Interactive Patient Education  2018 [employer].  IF you received an x-ray today, you will receive an invoice from [facility]. Please contact [facility] at [phone] with questions or concerns regarding your invoice.   IF you received labwork today, you will receive an invoice from [facility]. Please contact [facility] at [phone] with questions or  concerns regarding your invoice.   Our billing staff will not be able to assist you with questions regarding bills from these companies.  You will be contacted with the lab results as soon as they are available. The fastest way to get your results is to activate your My Chart account. Instructions are located on the last page of this paperwork. If you have not heard from [place] regarding the results in 2 weeks, please contact this office.

## 2017-04-25 LAB — URINALYSIS, DIPSTICK ONLY
Bilirubin, UA: NEGATIVE
Glucose, UA: NEGATIVE
Ketones, UA: NEGATIVE
Nitrite, UA: NEGATIVE
RBC, UA: NEGATIVE
Specific Gravity, UA: 1.015 (ref 1.005–1.030)
Urobilinogen, Ur: 0.2 mg/dL (ref 0.2–1.0)
pH, UA: 5.5 (ref 5.0–7.5)

## 2017-04-26 ENCOUNTER — Encounter: Payer: Self-pay | Admitting: Physician Assistant

## 2017-05-13 ENCOUNTER — Telehealth: Payer: Self-pay | Admitting: Physician Assistant

## 2017-05-13 NOTE — Telephone Encounter (Signed)
Copied from CRM [id number]. Topic: Quick Communication - Rx Refill/Question >> May 13, 2017 12:51 PM Clack, Princella PellegriniJessica D wrote: Medication: metFORMIN (GLUCOPHAGE-XR) 500 MG 24 hr tablet [[id number]][[id number]]    Has the patient contacted their pharmacy? Yes.     (Agent: If no, request that the patient contact the pharmacy for the refill.)   Preferred Pharmacy (with phone number or street name): [facility] [address] [place] [address], [place] - [address] [phone] (Phone) [phone] (Fax)  Pt states she has been out of this medication for a few days now and is getting headaches.   Agent: Please be advised that RX refills may take up to 3 business days. We ask that you follow-up with your pharmacy.

## 2017-05-14 NOTE — Telephone Encounter (Signed)
Pt last seen 04/24/17 next visit scheduled for 06/26/17 Please Advise

## 2017-05-16 ENCOUNTER — Other Ambulatory Visit: Payer: Self-pay | Admitting: Physician Assistant

## 2017-05-16 DIAGNOSIS — E119 Type 2 diabetes mellitus without complications: Secondary | ICD-10-CM

## 2017-05-16 DIAGNOSIS — Z794 Long term (current) use of insulin: Principal | ICD-10-CM

## 2017-05-16 MED ORDER — METFORMIN HCL ER 500 MG PO TB24: 1000.0000 mg | ORAL_TABLET | Freq: Two times a day (BID) | ORAL | 1 refills | Status: DC

## 2017-05-16 NOTE — Progress Notes (Signed)
Meds ordered this encounter  Medications  . metFORMIN (GLUCOPHAGE-XR) 500 MG 24 hr tablet    Sig: Take 2 tablets (1,000 mg total) by mouth 2 (two) times daily with a meal.    Dispense:  180 tablet    Refill:  1    Order Specific Question:   Supervising Provider    Answer:   Ethelda ChickSMITH, KRISTI M [[id number]]

## 2017-05-16 NOTE — Telephone Encounter (Signed)
Please call pt and let her know I have refilled her medication.

## 2017-05-16 NOTE — Telephone Encounter (Signed)
Please call pt and let her know I have refilled her medication.  

## 2017-05-17 NOTE — Telephone Encounter (Signed)
Pt notified and verbalized understanding.

## 2017-06-26 ENCOUNTER — Ambulatory Visit (INDEPENDENT_AMBULATORY_CARE_PROVIDER_SITE_OTHER): Payer: PPO | Admitting: Physician Assistant

## 2017-06-26 ENCOUNTER — Encounter: Payer: Self-pay | Admitting: Physician Assistant

## 2017-06-26 ENCOUNTER — Other Ambulatory Visit: Payer: Self-pay

## 2017-06-26 ENCOUNTER — Ambulatory Visit (INDEPENDENT_AMBULATORY_CARE_PROVIDER_SITE_OTHER): Payer: PPO

## 2017-06-26 VITALS — BP 148/84 | HR 84 | Temp 97.6°F | Resp 16 | Ht 68.11 in | Wt 196.0 lb

## 2017-06-26 DIAGNOSIS — R05 Cough: Secondary | ICD-10-CM | POA: Diagnosis not present

## 2017-06-26 DIAGNOSIS — R350 Frequency of micturition: Secondary | ICD-10-CM

## 2017-06-26 DIAGNOSIS — J209 Acute bronchitis, unspecified: Secondary | ICD-10-CM | POA: Diagnosis not present

## 2017-06-26 DIAGNOSIS — R82998 Other abnormal findings in urine: Secondary | ICD-10-CM | POA: Diagnosis not present

## 2017-06-26 DIAGNOSIS — N644 Mastodynia: Secondary | ICD-10-CM | POA: Diagnosis not present

## 2017-06-26 DIAGNOSIS — E119 Type 2 diabetes mellitus without complications: Secondary | ICD-10-CM

## 2017-06-26 DIAGNOSIS — D869 Sarcoidosis, unspecified: Secondary | ICD-10-CM | POA: Diagnosis not present

## 2017-06-26 DIAGNOSIS — R062 Wheezing: Secondary | ICD-10-CM

## 2017-06-26 DIAGNOSIS — Z794 Long term (current) use of insulin: Secondary | ICD-10-CM | POA: Diagnosis not present

## 2017-06-26 LAB — POCT URINALYSIS DIP (MANUAL ENTRY)
Bilirubin, UA: NEGATIVE
Glucose, UA: NEGATIVE mg/dL
Ketones, POC UA: NEGATIVE mg/dL
Nitrite, UA: NEGATIVE
Protein Ur, POC: NEGATIVE mg/dL
Spec Grav, UA: 1.015 (ref 1.010–1.025)
Urobilinogen, UA: 0.2 E.U./dL
pH, UA: 6 (ref 5.0–8.0)

## 2017-06-26 LAB — POCT CBC
Granulocyte percent: 2.9 %G — AB (ref 37–80)
HCT, POC: 34.1 % — AB (ref 37.7–47.9)
Hemoglobin: 11 g/dL — AB (ref 12.2–16.2)
Lymph, poc: 24.5 — AB (ref 0.6–3.4)
MCH, POC: 27 pg (ref 27–31.2)
MCHC: 32.4 g/dL (ref 31.8–35.4)
MCV: 83.3 fL (ref 80–97)
MID (cbc): 68 — AB (ref 0–0.9)
MPV: 8.9 fL (ref 0–99.8)
POC Granulocyte: 0.3 — AB (ref 2–6.9)
POC LYMPH PERCENT: 7.5 %L — AB (ref 10–50)
POC MID %: 1.1 %M (ref 0–12)
Platelet Count, POC: 202 10*3/uL (ref 142–424)
RBC: 4.09 M/uL (ref 4.04–5.48)
RDW, POC: 13.4 %
WBC: 4.3 10*3/uL — AB (ref 4.6–10.2)

## 2017-06-26 LAB — POC MICROSCOPIC URINALYSIS (UMFC): Mucus: ABSENT

## 2017-06-26 LAB — POCT GLYCOSYLATED HEMOGLOBIN (HGB A1C)

## 2017-06-26 MED ORDER — CEPHALEXIN 500 MG PO CAPS: 500.0000 mg | ORAL_CAPSULE | Freq: Two times a day (BID) | ORAL | 0 refills | Status: DC

## 2017-06-26 MED ORDER — ALBUTEROL SULFATE (2.5 MG/3ML) 0.083% IN NEBU
2.5000 mg | INHALATION_SOLUTION | Freq: Once | RESPIRATORY_TRACT | Status: AC
  Administered 2017-06-26: 2.5 mg via RESPIRATORY_TRACT

## 2017-06-26 MED ORDER — PREDNISONE 20 MG PO TABS: 40.0000 mg | ORAL_TABLET | Freq: Every day | ORAL | 0 refills | Status: AC

## 2017-06-26 NOTE — Patient Instructions (Addendum)
We are going to treat your underlying inflammation with oral prednisone. Prednisone is a steroid and can cause side effects such as headache, irritability, nausea, vomiting, increased heart rate, increased blood pressure, increased blood sugar, appetite changes, and insomnia. Please take tablets in the morning with a full meal to help decrease the chances of these side effects.   I have placed a referral for pulmonology for further evaluation of your sarcoidosis and to endocrinology for further evaluation of your diabetes. In terms of management of diabetes, increase your insulin by 2 units every 4 nights until you reach a fasting blood sugar of 110. Do not go beyond 34 units per night without contact our office.   Your results indicate you have a UTI. I have given you a prescription for an antibiotic. Please take with food. I have sent off a urine culture and we should have those results in 48 hours. If your symptoms worsen while you are awaiting these results or you develop fever, chills, flank pian, nausea and vomiting, please seek care immediately.       IF you received an x-ray today, you will receive an invoice from [facility]. Please contact [facility] at [phone] with questions or concerns regarding your invoice.   IF you received labwork today, you will receive an invoice from [facility]. Please contact [facility] at [phone] with questions or concerns regarding your invoice.  She is overall well-appearing, no distress.  Vitals stable.  Recommend follow-up in 5 days for reevaluation.  Our billing staff will not be able to assist you with questions regarding bills from these companies.  You will be contacted with the lab results as soon as they are available. The fastest way to get your results is to activate your My Chart account. Instructions are located on the last page of this paperwork. If you have not heard from us regarding the results in 2 weeks, please contact  this office.

## 2017-06-26 NOTE — Progress Notes (Signed)
UA with   MRN: [id number]  Subjective:   Jeanette Ellis is a 67 y.o. female who presents for multiple issues.   1)F ollow up of Type 2 diabetes mellitus. Last A1C 3 months ago was 9.0.   Patient is currently managed with continued metformin XR 1000mg  BID and toujeo 25-26 Units daily. Admits good compliance. Denies adverse effects including metallic taste, hypoglycemia, nausea, vomiting.  Patient is checking home blood sugars. Home blood sugar records: BGs range between 80 and 121. Has seen some readings in 200s. Denies foot ulcerations, nausea, paresthesia of the feet, polydipsia, visual disturbances, vomitting and weight loss.  Patient is checking their feet daily. No foot concerns. Last diabetic eye exam eye exam 03/2018.  Diet: still trying to avoid excess sweets, sodas, and juices and is limiting her carbs. Trying to walk at least 30 minutes daily. Denies smoking or alcohol use.  Known diabetic complications: none Immunizations: Flu vaccine: 05/2016,  pneumococal vaccine 2017  Other concerns: 1) Urinary frequency started last night. Denies urinary urgency, dysuria, hematuria, and vaginal discharge.  Has been a while since she has had a UTI.  2) Right breast pain x 9 months. Feels like pulled muscle. Nothing makes it worse and nothing makes it better. Last mammagram was in 07/2016 and it was normal.  Has another one scheduled for April.  Denies breast mass, nipple discharge, skin changes, and asymmetry.  Pt has not tried anything for relief. Has PMH of sarcoidosis. No PMH of breast cancer. No FH of breast cancer.  Notes this is what it felt like when her sarcoidosis initially flared up.  He is to be followed by pulmonology but has not seen one in quite some time.  3) Had sore throat and runny nose this past week, which resolved.  Has had a lingering productive cough x few days. Has some associated wheezing. Denies DOE and SOB. Has PMH of sarcoidosis. No PMH of asthma or COPD. Denies smoking.    Review of Systems  Constitutional: Negative for malaise/fatigue and weight loss.  HENT: Negative for nosebleeds, sinus pain and tinnitus.   Eyes: Negative for blurred vision, double vision and photophobia.  Respiratory: Negative for hemoptysis.   Cardiovascular: Negative for palpitations and leg swelling.  Gastrointestinal: Negative for blood in stool and heartburn.  Genitourinary: Negative for hematuria.  Musculoskeletal: Negative for back pain and joint pain.  Neurological: Negative for dizziness.  Endo/Heme/Allergies: Negative for polydipsia.     Social History   Socioeconomic History  . Marital status: Married    Spouse name: Zynzulu  . Number of children: 4  . Years of education: Not on file  . Highest education level: Not on file  Social Needs  . Financial resource strain: Not on file  . Food insecurity - worry: Not on file  . Food insecurity - inability: Not on file  . Transportation needs - medical: Not on file  . Transportation needs - non-medical: Not on file  Occupational History  . Not on file  Tobacco Use  . Smoking status: Never Smoker  . Smokeless tobacco: Never Used  Substance and Sexual Activity  . Alcohol use: No  . Drug use: No  . Sexual activity: Not Currently  Other Topics Concern  . Not on file  Social History Narrative  . Not on file      Objective:   PHYSICAL EXAM BP (!) 148/84 (BP Location: Right Arm, Patient Position: Sitting, Cuff Size: Large)   Pulse 84   Temp  97.6 F (36.4 C) (Oral)   Resp 16   Ht 5' 8.11" (1.73 m)   Wt 196 lb (88.9 kg)   SpO2 99%   BMI 29.71 kg/m   Physical Exam  Constitutional: She is oriented to person, place, and time. She appears well-developed and well-nourished. No distress.  HENT:  Head: Normocephalic and atraumatic.  Right Ear: Tympanic membrane, external ear and ear canal normal.  Left Ear: Tympanic membrane, external ear and ear canal normal.  Nose: Nose normal. Right sinus exhibits no  maxillary sinus tenderness and no frontal sinus tenderness. Left sinus exhibits no maxillary sinus tenderness and no frontal sinus tenderness.  Mouth/Throat: Uvula is midline, oropharynx is clear and moist and mucous membranes are normal. No tonsillar exudate.  Eyes: Conjunctivae are normal.  Neck: Normal range of motion.  Cardiovascular: Normal rate, regular rhythm and normal heart sounds.  Pulmonary/Chest: Effort normal. She has no decreased breath sounds. She has wheezes (diffuse wheezes noted in bilateral lung fields). She has no rhonchi. She has no rales. She exhibits no tenderness. Right breast exhibits no inverted nipple, no mass, no nipple discharge, no skin change and no tenderness.  Abdominal: Soft. Normal appearance. There is no tenderness.  Lymphadenopathy:       Head (right side): No submental, no submandibular, no tonsillar, no preauricular, no posterior auricular and no occipital adenopathy present.       Head (left side): No submental, no submandibular, no tonsillar, no preauricular, no posterior auricular and no occipital adenopathy present.    She has no cervical adenopathy.       Right: No supraclavicular adenopathy present.       Left: No supraclavicular adenopathy present.  Neurological: She is alert and oriented to person, place, and time.  Skin: Skin is warm and dry.  Psychiatric: She has a normal mood and affect.  Vitals reviewed.   Diabetic Foot Exam - Simple   No data filed      Results for orders placed or performed in visit on 06/26/17 (from the past 24 hour(s))  POCT urinalysis dipstick     Status: Abnormal   Collection Time: 06/26/17 10:49 AM  Result Value Ref Range   Color, UA yellow yellow   Clarity, UA clear clear   Glucose, UA negative negative mg/dL   Bilirubin, UA negative negative   Ketones, POC UA negative negative mg/dL   Spec Grav, UA 1.610 9.604 - 1.025   Blood, UA trace-intact (A) negative   pH, UA 6.0 5.0 - 8.0   Protein Ur, POC negative  negative mg/dL   Urobilinogen, UA 0.2 0.2 or 1.0 E.U./dL   Nitrite, UA Negative Negative   Leukocytes, UA Moderate (2+) (A) Negative  POCT glycosylated hemoglobin (Hb A1C)     Status: Abnormal   Collection Time: 06/26/17 10:53 AM  Result Value Ref Range   Hemoglobin A1C 8.2a   POCT CBC     Status: Abnormal   Collection Time: 06/26/17 12:12 PM  Result Value Ref Range   WBC 4.3 (A) 4.6 - 10.2 K/uL   Lymph, poc 24.5 (A) 0.6 - 3.4   POC LYMPH PERCENT 7.5 (A) 10 - 50 %L   MID (cbc) 68.0 (A) 0 - 0.9   POC MID % 1.1 0 - 12 %M   POC Granulocyte 0.3 (A) 2 - 6.9   Granulocyte percent 2.9 (A) 37 - 80 %G   RBC 4.09 4.04 - 5.48 M/uL   Hemoglobin 11.0 (A) 12.2 - 16.2 g/dL  HCT, POC 34.1 (A) 37.7 - 47.9 %   MCV 83.3 80 - 97 fL   MCH, POC 27.0 27 - 31.2 pg   MCHC 32.4 31.8 - 35.4 g/dL   RDW, POC 16.113.4 %   Platelet Count, POC 202 142 - 424 K/uL   MPV 8.9 0 - 99.8 fL  POCT Microscopic Urinalysis ([facility])     Status: Abnormal   Collection Time: 06/26/17  2:07 PM  Result Value Ref Range   WBC,UR,HPF,POC Moderate (A) None WBC/hpf   RBC,UR,HPF,POC None None RBC/hpf   Bacteria Many (A) None, Too numerous to count   Mucus Absent Absent   Epithelial Cells, UR Per Microscopy Few (A) None, Too numerous to count cells/hpf   Dg Chest 2 View  Result Date: 06/26/2017 CLINICAL DATA:  Cough and wheezing EXAM: CHEST - 2 VIEW COMPARISON:  01/04/2017 plain film, 01/09/2016 CT of the chest FINDINGS: Cardiac shadow is stable. Pleuroparenchymal scarring is again noted in the apices. Mild interstitial changes are seen without focal infiltrate or sizable effusion. These changes are stable dating back to 2017. multiple calcified lymph nodes are again noted consistent with prior granulomatous disease. Degenerative changes of thoracic spine are noted. IMPRESSION: Chronic changes without acute abnormality. The overall appearance is similar to that seen on prior plain film and CT consistent with underlying sarcoidosis. No  acute abnormality is noted. Electronically Signed   By: Alcide CleverMark  Lukens M.D.   On: 06/26/2017 11:51    Wheezing improved with breathing tx. Admits to feeling more open. Few wheezes auscultated on lung exam.  SPO2 increased from 93% to 99% post breathing treatment.  Assessment and Plan :  1. Type 2 diabetes mellitus without complication, with long-term current use of insulin (HCC) Uncontrolled. She is asymptomatic. A1c has improved from 9.0 at last visit to 8.2 today.  Will refer to endocrinology for further evaluation and treatment.  In the meantime, recommended she increase her Toujeo  2 units every 4 nights until she reaches a fasting morning glucose of 110 or a max of 34 units.  Educated on signs of hypoglycemia.   - POCT urinalysis dipstick - POCT glycosylated hemoglobin (Hb A1C) - Ambulatory referral to Endocrinology  2. Bronchitis with bronchospasm History and physical exam findings consistent with bronchitis.  Wheezing improved with breathing treatment.  Chest x-ray with chronic changes without acute abnormality.  She is overall well-appearing, no distress.  Vitals stable.  Recommend follow-up in 5 days for reevaluation.  If no full resolution and wheezing at that time, consider extending course of of prednisone.  Given strict return precautions. 3. Wheezing - albuterol (PROVENTIL) (2.5 MG/3ML) 0.083% nebulizer solution 2.5 mg - DG Chest 2 View; Future - POCT CBC - Ambulatory referral to Pulmonology - predniSONE (DELTASONE) 20 MG tablet; Take 2 tablets (40 mg total) by mouth daily with breakfast for 5 days.  Dispense: 10 tablet; Refill: 0  4. Sarcoidosis No palpable breast pain or mass noted on exam.  Chest x-ray with chronic changes and no acute abnormality.  Due to history of sarcoidosis patient with worsening breast pain she would benefit from further evaluation by pulmonology at this time as she could be having a relapse. She will be tx with short course of prednisone but should follow  up with pulmonology for further management.  - Ambulatory referral to Pulmonology - predniSONE (DELTASONE) 20 MG tablet; Take 2 tablets (40 mg total) by mouth daily with breakfast for 5 days.  Dispense: 10 tablet; Refill: 0  5.  Breast pain No palpable mass or breast pain noted on exam.  Suspect this could be due to sarcoidosis.  Recommend  routine mammogram in April 2019 and follow-up with pulmonology for further evaluation.  Given strict return precautions.  6. Urinary frequency  - POCT Microscopic Urinalysis ([facility])  7. Leukocytes in urine Patient started having urinary frequency last night.  UA with leukocytes and moderate WBCs.  Urine culture pending.  Will treat empirically at this time for UTI.  - Urine Culture - cephALEXin (KEFLEX) 500 MG capsule; Take 1 capsule (500 mg total) by mouth 2 (two) times daily.  Dispense: 14 capsule; Refill: 0   Benjiman Core, PA-C  Primary Care at [facility] 06/26/2017 9:46 PM

## 2017-06-26 NOTE — Addendum Note (Signed)
Addended by: Benjiman CoreWISEMAN, Raed Schalk D on: 06/26/2017 11:04 PM   Modules accepted: Level of Service

## 2017-06-29 LAB — URINE CULTURE

## 2017-07-01 ENCOUNTER — Ambulatory Visit (INDEPENDENT_AMBULATORY_CARE_PROVIDER_SITE_OTHER): Payer: PPO | Admitting: Physician Assistant

## 2017-07-01 ENCOUNTER — Other Ambulatory Visit: Payer: Self-pay

## 2017-07-01 ENCOUNTER — Encounter: Payer: Self-pay | Admitting: Physician Assistant

## 2017-07-01 VITALS — BP 156/83 | HR 68 | Temp 98.3°F | Resp 18 | Ht 68.62 in | Wt 196.0 lb

## 2017-07-01 DIAGNOSIS — D869 Sarcoidosis, unspecified: Secondary | ICD-10-CM | POA: Diagnosis not present

## 2017-07-01 DIAGNOSIS — N3000 Acute cystitis without hematuria: Secondary | ICD-10-CM

## 2017-07-01 DIAGNOSIS — R062 Wheezing: Secondary | ICD-10-CM | POA: Diagnosis not present

## 2017-07-01 DIAGNOSIS — J209 Acute bronchitis, unspecified: Secondary | ICD-10-CM

## 2017-07-01 NOTE — Progress Notes (Signed)
Jeanette Ellis  MRN: [id number] DOB: 1951/02/08  Subjective:  Jeanette Ellis is a 67 y.o. female seen in office today for a chief complaint of follow-up on illness.  Patient initially seen on 06/26/17 for follow-up on diabetes and had multiple complaints at that visit.  Please see that OV note for additional details. Today, she notes she is doing much better. She has taken her prednisone and keflex as prescribed. Just completed prednisone course. Her cough has improved.  Her urinary frequency, wheezing, and shortness of breath have resolved. She is still having some breast pain but it is much improved since initial visit.  Has not heard from pulmonology or endocrinology referral at this time. Notes her sugars have been running in the 200s since starting prednisone. Has increased her insulin up to 28 U per night. No other complaints or issues.   Review of Systems  Constitutional: Negative for chills, diaphoresis and fever.  Cardiovascular: Negative for palpitations.  Endocrine: Negative for polydipsia, polyphagia and polyuria.  Genitourinary: Negative for dysuria and urgency.  Neurological: Negative for dizziness and light-headedness.    Patient Active Problem List   Diagnosis Date Noted  . Iron deficiency anemia 03/11/2017  . Anemia associated with stage 3 chronic renal failure (HCC) 03/11/2017  . History of sarcoidosis 06/09/2015  . Type 2 diabetes mellitus without complication, with long-term current use of insulin (HCC) 01/12/2014  . Essential hypertension 09/21/2013  . Hyperlipemia 09/21/2013    Current Outpatient Medications on File Prior to Visit  Medication Sig Dispense Refill  . aspirin EC 81 MG tablet Take 81 mg by mouth.    Marland Kitchen. atorvastatin (LIPITOR) 20 MG tablet Take 1 tablet (20 mg total) by mouth daily. 90 tablet 1  . Insulin Glargine (TOUJEO SOLOSTAR) 300 UNIT/ML SOPN Inject 25 Units into the skin daily. 4.5 mL 0  . Insulin Pen Needle 31G X 6 MM MISC 1 Units by Does  not apply route once a week. 100 each 0  . Insulin Syringe-Needle U-100 (INSULIN SYRINGE .3CC/31GX5/16") 31G X 5/16" 0.3 ML MISC Use with Toujeo pen once a day 100 each 1  . lisinopril (PRINIVIL,ZESTRIL) 10 MG tablet Take 1 tablet (10 mg total) by mouth daily. 90 tablet 1  . metFORMIN (GLUCOPHAGE-XR) 500 MG 24 hr tablet Take 2 tablets (1,000 mg total) by mouth 2 (two) times daily with a meal. 180 tablet 1  . Multiple Vitamin (MULTIVITAMIN WITH MINERALS) TABS tablet Take 1 tablet by mouth daily.    . cephALEXin (KEFLEX) 500 MG capsule Take 1 capsule (500 mg total) by mouth 2 (two) times daily. (Patient not taking: Reported on 07/01/2017) 14 capsule 0  . ferrous sulfate 325 (65 FE) MG EC tablet Take 1 tablet (325 mg total) by mouth once for 1 dose. 1 tablet 0  . predniSONE (DELTASONE) 20 MG tablet Take 2 tablets (40 mg total) by mouth daily with breakfast for 5 days. (Patient not taking: Reported on 07/01/2017) 10 tablet 0   No current facility-administered medications on file prior to visit.     No Known Allergies   Objective:  BP (!) 156/83 (BP Location: Right Arm, Patient Position: Sitting, Cuff Size: Normal)   Pulse 68   Temp 98.3 F (36.8 C) (Oral)   Resp 18   Ht 5' 8.62" (1.743 m)   Wt 196 lb (88.9 kg)   SpO2 97%   BMI 29.26 kg/m   Physical Exam  Constitutional: She is oriented to person, place, and time and  well-developed, well-nourished, and in no distress.  HENT:  Head: Normocephalic and atraumatic.  Mouth/Throat: Oropharynx is clear and moist and mucous membranes are normal.  Eyes: Conjunctivae are normal.  Neck: Normal range of motion.  Cardiovascular: Normal rate, regular rhythm and normal heart sounds.  Pulmonary/Chest: Effort normal and breath sounds normal. She has no decreased breath sounds. She has no wheezes. She has no rhonchi. She has no rales.  Neurological: She is alert and oriented to person, place, and time. Gait normal.  Skin: Skin is warm and dry.    Psychiatric: Affect normal.  Vitals reviewed.   Assessment and Plan :  1. Bronchitis with bronchospasm Improving. Wheezing resolved. Lungs CTAB. Follow up with pulmonology for further evaluation/managment of sarcoidosis.  2. Wheezing 3. Sarcoidosis 4. Acute cystitis without hematuria Urine culture positive for enterococcus faecalis, which was susceptible to keflex. She is asymptomatic. Recommend completing keflex as prescribed.   Benjiman Core PA-C  Primary Care at [facility] 07/01/2017 12:05 PM

## 2017-07-01 NOTE — Patient Instructions (Addendum)
For anemia, contact:  Dr. Arlan OrganPeter Ennever  Address: [address], [place], [place] [address]  Phone: [phone]   Continue monitoring your sugar. You should expect them to go back to your normal within 3-5 days. You can increase toujeo as discussed earlier. You should hear from both pulmonology and endocrinology in the next week.    In the meantime, I recommend getting your mammogram on the designated date in April.   Follow up with me as needed.     IF you received an x-ray today, you will receive an invoice from [facility]. Please contact [facility] at [phone] with questions or concerns regarding your invoice.   IF you received labwork today, you will receive an invoice from [facility]. Please contact [facility] at [phone] with questions or concerns regarding your invoice.   Our billing staff will not be able to assist you with questions regarding bills from these companies.  You will be contacted with the lab results as soon as they are available. The fastest way to get your results is to activate your My Chart account. Instructions are located on the last page of this paperwork. If you have not heard from us regarding the results in 2 weeks, please contact this office.

## 2017-07-05 ENCOUNTER — Encounter: Payer: Self-pay | Admitting: Physician Assistant

## 2017-08-01 ENCOUNTER — Ambulatory Visit (INDEPENDENT_AMBULATORY_CARE_PROVIDER_SITE_OTHER): Payer: PPO | Admitting: Pulmonary Disease

## 2017-08-01 ENCOUNTER — Telehealth: Payer: Self-pay | Admitting: Physician Assistant

## 2017-08-01 ENCOUNTER — Encounter: Payer: Self-pay | Admitting: Pulmonary Disease

## 2017-08-01 ENCOUNTER — Other Ambulatory Visit (INDEPENDENT_AMBULATORY_CARE_PROVIDER_SITE_OTHER): Payer: PPO

## 2017-08-01 VITALS — BP 128/70 | HR 78 | Ht 70.0 in | Wt 197.8 lb

## 2017-08-01 DIAGNOSIS — R05 Cough: Secondary | ICD-10-CM

## 2017-08-01 DIAGNOSIS — R0602 Shortness of breath: Secondary | ICD-10-CM

## 2017-08-01 DIAGNOSIS — Z794 Long term (current) use of insulin: Principal | ICD-10-CM

## 2017-08-01 DIAGNOSIS — E119 Type 2 diabetes mellitus without complications: Secondary | ICD-10-CM

## 2017-08-01 DIAGNOSIS — R059 Cough, unspecified: Secondary | ICD-10-CM

## 2017-08-01 LAB — NITRIC OXIDE: Nitric Oxide: 18

## 2017-08-01 LAB — CBC WITH DIFFERENTIAL/PLATELET
Basophils Absolute: 0 10*3/uL (ref 0.0–0.1)
Basophils Relative: 0.7 % (ref 0.0–3.0)
Eosinophils Absolute: 0.2 10*3/uL (ref 0.0–0.7)
Eosinophils Relative: 5.8 % — ABNORMAL HIGH (ref 0.0–5.0)
HCT: 34 % — ABNORMAL LOW (ref 36.0–46.0)
Hemoglobin: 10.9 g/dL — ABNORMAL LOW (ref 12.0–15.0)
Lymphocytes Relative: 28.6 % (ref 12.0–46.0)
Lymphs Abs: 1 10*3/uL (ref 0.7–4.0)
MCHC: 31.9 g/dL (ref 30.0–36.0)
MCV: 84 fl (ref 78.0–100.0)
Monocytes Absolute: 0.4 10*3/uL (ref 0.1–1.0)
Monocytes Relative: 10.4 % (ref 3.0–12.0)
Neutro Abs: 1.9 10*3/uL (ref 1.4–7.7)
Neutrophils Relative %: 54.5 % (ref 43.0–77.0)
Platelets: 223 10*3/uL (ref 150.0–400.0)
RBC: 4.05 Mil/uL (ref 3.87–5.11)
RDW: 13.5 % (ref 11.5–15.5)
WBC: 3.6 10*3/uL — ABNORMAL LOW (ref 4.0–10.5)

## 2017-08-01 MED ORDER — FLUTICASONE FUROATE-VILANTEROL 200-25 MCG/INH IN AEPB: 1.0000 | INHALATION_SPRAY | Freq: Every day | RESPIRATORY_TRACT | 6 refills | Status: DC

## 2017-08-01 MED ORDER — FLUTICASONE FUROATE-VILANTEROL 200-25 MCG/INH IN AEPB: 1.0000 | INHALATION_SPRAY | Freq: Every day | RESPIRATORY_TRACT | 0 refills | Status: AC

## 2017-08-01 NOTE — Telephone Encounter (Signed)
Copied from CRM [id number]. Topic: Quick Communication - Rx Refill/Question >> Aug 01, 2017  2:40 PM Maia PettiesOrtiz, Kristie S wrote: Medication: Insulin Glargine (TOUJEO SOLOSTAR) 300 UNIT/ML SOPN - pt is out of medication Has the patient contacted their pharmacy? Yes - advised pt to contact MD too Preferred Pharmacy (with phone number or street name): [facility] [address] - [place], [place] - [address] [place] [address] Phone: [phone] Fax: [phone]

## 2017-08-01 NOTE — Progress Notes (Signed)
NAZIYA HEGWOOD    [id number]    16-Jun-1950  Primary Care Physician:Wiseman, Gerald Stabs, PA-C  Referring Physician: Magdalene River, PA-C [address] [place], [place] [address]  Chief complaint: Consult for sarcoidosis  HPI: 67 year old with history of sarcoidosis.  Diagnosed more than 30 years ago with a mediastinoscopy and had been treated with intermittent prednisone.  She had not required any therapy for sarcoidosis for many years.  She used to follow with Dr. Kriste Basque  Seen by her primary care doctor in late March for episode of bronchitis with wheezing and given a prednisone taper.  This has improved her symptoms.  She still has some productive cough with white mucus and occasional episodes of dyspnea with wheezing.  Denies any fevers, chills  Pets: None Occupation: Used to work in [employer].  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  However she does not report any overt mold exposure.  No hot tubs Smoking history: Never smoker Travel History: Lived in [place].  No other significant travel Family history: 2 siblings with sarcoidosis.  Outpatient Encounter Medications as of 08/01/2017  Medication Sig  . aspirin EC 81 MG tablet Take 81 mg by mouth.  Marland Kitchen atorvastatin (LIPITOR) 20 MG tablet Take 1 tablet (20 mg total) by mouth daily.  . Insulin Glargine (TOUJEO SOLOSTAR) 300 UNIT/ML SOPN Inject 25 Units into the skin daily.  . Insulin Pen Needle 31G X 6 MM MISC 1 Units by Does not apply route once a week.  . Insulin Syringe-Needle U-100 (INSULIN SYRINGE .3CC/31GX5/16") 31G X 5/16" 0.3 ML MISC Use with Toujeo pen once a day  . lisinopril (PRINIVIL,ZESTRIL) 10 MG tablet Take 1 tablet (10 mg total) by mouth daily.  . metFORMIN (GLUCOPHAGE-XR) 500 MG 24 hr tablet Take 2 tablets (1,000 mg total) by mouth 2 (two) times daily with a meal.  . Multiple Vitamin (MULTIVITAMIN WITH MINERALS) TABS tablet Take 1 tablet by mouth  daily.  . [DISCONTINUED] cephALEXin (KEFLEX) 500 MG capsule Take 1 capsule (500 mg total) by mouth 2 (two) times daily.  . ferrous sulfate 325 (65 FE) MG EC tablet Take 1 tablet (325 mg total) by mouth once for 1 dose.   No facility-administered encounter medications on file as of 08/01/2017.     Allergies as of 08/01/2017  . (No Known Allergies)    Past Medical History:  Diagnosis Date  . Anemia associated with stage 3 chronic renal failure (HCC) 03/11/2017  . Diabetes mellitus without complication (HCC)   . High cholesterol   . Hypertension   . Iron deficiency anemia 03/11/2017  . Sarcoidosis     Past Surgical History:  Procedure Laterality Date  . ABDOMINAL HYSTERECTOMY      Family History  Problem Relation Age of Onset  . High blood pressure Mother   . Diabetes Father     Social History   Socioeconomic History  . Marital status: Married    Spouse name: Zynzulu  . Number of children: 4  . Years of education: Not on file  . Highest education level: Not on file  Occupational History  . Not on file  Social Needs  . Financial resource strain: Not on file  . Food insecurity:    Worry: Not on file    Inability: Not on file  . Transportation needs:    Medical: Not on file    Non-medical: Not on file  Tobacco  Use  . Smoking status: Never Smoker  . Smokeless tobacco: Never Used  Substance and Sexual Activity  . Alcohol use: No  . Drug use: No  . Sexual activity: Not Currently  Lifestyle  . Physical activity:    Days per week: Not on file    Minutes per session: Not on file  . Stress: Not on file  Relationships  . Social connections:    Talks on phone: Not on file    Gets together: Not on file    Attends religious service: Not on file    Active member of club or organization: Not on file    Attends meetings of clubs or organizations: Not on file    Relationship status: Not on file  . Intimate partner violence:    Fear of current or ex partner: Not on file      Emotionally abused: Not on file    Physically abused: Not on file    Forced sexual activity: Not on file  Other Topics Concern  . Not on file  Social History Narrative  . Not on file    Review of systems: Review of Systems  Constitutional: Negative for fever and chills.  HENT: Negative.   Eyes: Negative for blurred vision.  Respiratory: as per HPI  Cardiovascular: Negative for chest pain and palpitations.  Gastrointestinal: Negative for vomiting, diarrhea, blood per rectum. Genitourinary: Negative for dysuria, urgency, frequency and hematuria.  Musculoskeletal: Negative for myalgias, back pain and joint pain.  Skin: Negative for itching and rash.  Neurological: Negative for dizziness, tremors, focal weakness, seizures and loss of consciousness.  Endo/Heme/Allergies: Negative for environmental allergies.  Psychiatric/Behavioral: Negative for depression, suicidal ideas and hallucinations.  All other systems reviewed and are negative.  Physical Exam: Blood pressure 128/70, pulse 78, height 5\' 10"  (1.778 m), weight 197 lb 12.8 oz (89.7 kg), SpO2 98 %. Gen:      No acute distress HEENT:  EOMI, sclera anicteric Neck:     No masses; no thyromegaly Lungs:    Clear to auscultation bilaterally; normal respiratory effort CV:         Regular rate and rhythm; no murmurs Abd:      + bowel sounds; soft, non-tender; no palpable masses, no distension Ext:    No edema; adequate peripheral perfusion Skin:      Warm and dry; no rash Neuro: alert and oriented x 3 Psych: normal mood and affect  Data Reviewed: CT chest 01/09/2016- stable calcified mediastinal adenopathy, bilateral pulmonary nodules.  Honeycombing at the lung bases. CT chest 07/12/2015- numerous bilateral subcentimeter pulmonary nodules.  Mediastinal adenopathy, peripheral fibrosis. I have reviewed the images personally.  FENO 08/31/2017-18  Assessment:  Assessment of sarcoidosis She has history of long-standing sarcoidosis  which wasstable on prior imaging in 2017 which shows stable calcified mediastinal adenopathy and pulmonary nodules, peripheral scarring  As she has symptoms of dyspnea with wheezing we will reevaluate for sarcoidosis reactivation vs reactive ariway disease with high-resolution CT and pulmonary function test Check CBC differential, blood allergy profile profile, serologies for connective tissue disease including angiotensin converting enzyme, ANA, rheumatoid factor, CCP. Start breo inhaler  Plan/Recommendations: - Blood test for allergies, eosinophilia and CTDs - High-resolution CT, PFTs - Breo inhaler.  Chilton GreathousePraveen Velmer Woelfel MD  Pulmonary and Critical Care 08/01/2017, 11:42 AM  CC: Benjiman CoreWiseman, Brittany D, PA*

## 2017-08-01 NOTE — Patient Instructions (Addendum)
We will check a CBC with differential, blood allergy profile, angiotensin-converting enzyme, ANA with reflex, rheumatoid factor, CCP Schedule you for high-resolution CT for evaluation of sarcoidosis and pulmonary function test We will give your Breo inhaler Follow-up in 2-4 weeks for review of tests.

## 2017-08-02 ENCOUNTER — Ambulatory Visit (HOSPITAL_BASED_OUTPATIENT_CLINIC_OR_DEPARTMENT_OTHER)
Admission: RE | Admit: 2017-08-02 | Discharge: 2017-08-02 | Disposition: A | Discharge status: Home / Self Care | Payer: PPO | Source: Ambulatory Visit | Attending: Pulmonary Disease | Admitting: Pulmonary Disease

## 2017-08-02 DIAGNOSIS — I700 Atherosclerosis of aorta: Secondary | ICD-10-CM | POA: Insufficient documentation

## 2017-08-02 DIAGNOSIS — R590 Localized enlarged lymph nodes: Secondary | ICD-10-CM | POA: Insufficient documentation

## 2017-08-02 DIAGNOSIS — I289 Disease of pulmonary vessels, unspecified: Secondary | ICD-10-CM | POA: Insufficient documentation

## 2017-08-02 DIAGNOSIS — R0602 Shortness of breath: Secondary | ICD-10-CM

## 2017-08-02 DIAGNOSIS — I7789 Other specified disorders of arteries and arterioles: Secondary | ICD-10-CM | POA: Insufficient documentation

## 2017-08-02 DIAGNOSIS — J439 Emphysema, unspecified: Secondary | ICD-10-CM | POA: Insufficient documentation

## 2017-08-02 LAB — RESPIRATORY ALLERGY PROFILE REGION II ~~LOC~~
Allergen, A. alternata, m6: <0.1 kU/L
Allergen, Cedar tree, t12: <0.1 kU/L
Allergen, Comm Silver Birch, t9: <0.1 kU/L
Allergen, Cottonwood, t14: <0.1 kU/L
Allergen, D pternoyssinus,d7: 8.44 kU/L — ABNORMAL HIGH
Allergen, Mouse Urine Protein, e78: <0.1 kU/L
Allergen, Mulberry, t76: <0.1 kU/L
Allergen, Oak,t7: <0.1 kU/L
Allergen, P. notatum, m1: <0.1 kU/L
Aspergillus fumigatus, m3: <0.1 kU/L
Bermuda Grass: <0.1 kU/L
Box Elder IgE: <0.1 kU/L
CLADOSPORIUM HERBARUM (M2) IGE: <0.1 kU/L
COMMON RAGWEED (SHORT) (W1) IGE: <0.1 kU/L
Cat Dander: 1.71 kU/L — ABNORMAL HIGH
Class: 0
Class: 0
Class: 0
Class: 0
Class: 0
Class: 0
Class: 0
Class: 0
Class: 0
Class: 0
Class: 0
Class: 0
Class: 0
Class: 0
Class: 0
Class: 0
Class: 0
Class: 0
Class: 0
Class: 2
Class: 2
Class: 2
Class: 3
Class: 3
Cockroach: 0.13 kU/L — ABNORMAL HIGH
D. farinae: 10.4 kU/L — ABNORMAL HIGH
Dog Dander: 1.12 kU/L — ABNORMAL HIGH
Elm IgE: <0.1 kU/L
IgE (Immunoglobulin E), Serum: 224 kU/L — ABNORMAL HIGH (ref <=114)
Johnson Grass: <0.1 kU/L
Pecan/Hickory Tree IgE: 0.29 kU/L — ABNORMAL HIGH
Rough Pigweed  IgE: <0.1 kU/L
Sheep Sorrel IgE: <0.1 kU/L
Timothy Grass: 2.32 kU/L — ABNORMAL HIGH

## 2017-08-02 LAB — INTERPRETATION:

## 2017-08-02 LAB — ANGIOTENSIN CONVERTING ENZYME: Angiotensin-Converting Enzyme: 28 U/L (ref 9–67)

## 2017-08-02 LAB — CYCLIC CITRUL PEPTIDE ANTIBODY, IGG: Cyclic Citrullin Peptide Ab: <16 UNITS

## 2017-08-02 LAB — RHEUMATOID FACTOR: Rhuematoid fact SerPl-aCnc: <14 IU/mL (ref <14)

## 2017-08-02 MED ORDER — INSULIN GLARGINE 300 UNIT/ML ~~LOC~~ SOPN
25.0000 [IU] | PEN_INJECTOR | Freq: Every day | SUBCUTANEOUS | 0 refills | Status: DC
Start: 2017-08-02 — End: 2017-08-07

## 2017-08-02 NOTE — Telephone Encounter (Signed)
toujeo refill Last OV: 06/26/17 Last Refill:03/25/17 4.5 ml Pharmacy:[facility] [address]  [place] Hgb A1C 8.2

## 2017-08-05 LAB — ANA: Anti Nuclear Antibody(ANA): POSITIVE — AB

## 2017-08-05 LAB — ANTI-NUCLEAR AB-TITER (ANA TITER): ANA Titer 1: 1:640 {titer} — ABNORMAL HIGH

## 2017-08-07 ENCOUNTER — Encounter: Payer: Self-pay | Admitting: Physician Assistant

## 2017-08-07 ENCOUNTER — Other Ambulatory Visit: Payer: Self-pay | Admitting: Pulmonary Disease

## 2017-08-07 ENCOUNTER — Ambulatory Visit (INDEPENDENT_AMBULATORY_CARE_PROVIDER_SITE_OTHER): Payer: PPO | Admitting: Physician Assistant

## 2017-08-07 ENCOUNTER — Other Ambulatory Visit: Payer: Self-pay

## 2017-08-07 VITALS — BP 120/64 | HR 85 | Temp 98.3°F | Resp 18 | Ht 67.56 in | Wt 195.1 lb

## 2017-08-07 DIAGNOSIS — Z794 Long term (current) use of insulin: Secondary | ICD-10-CM

## 2017-08-07 DIAGNOSIS — R82998 Other abnormal findings in urine: Secondary | ICD-10-CM

## 2017-08-07 DIAGNOSIS — R0602 Shortness of breath: Secondary | ICD-10-CM

## 2017-08-07 DIAGNOSIS — R350 Frequency of micturition: Secondary | ICD-10-CM | POA: Diagnosis not present

## 2017-08-07 DIAGNOSIS — E119 Type 2 diabetes mellitus without complications: Secondary | ICD-10-CM

## 2017-08-07 LAB — POCT URINALYSIS DIP (MANUAL ENTRY)
Bilirubin, UA: NEGATIVE
Glucose, UA: NEGATIVE mg/dL
Ketones, POC UA: NEGATIVE mg/dL
Nitrite, UA: NEGATIVE
Spec Grav, UA: 1.015 (ref 1.010–1.025)
Urobilinogen, UA: 0.2 E.U./dL
pH, UA: 5.5 (ref 5.0–8.0)

## 2017-08-07 LAB — POC MICROSCOPIC URINALYSIS (UMFC): Mucus: ABSENT

## 2017-08-07 LAB — GLUCOSE, POCT (MANUAL RESULT ENTRY): POC Glucose: 257 mg/dl — AB (ref 70–99)

## 2017-08-07 MED ORDER — CEPHALEXIN 500 MG PO CAPS: 500.0000 mg | ORAL_CAPSULE | Freq: Two times a day (BID) | ORAL | 0 refills | Status: AC

## 2017-08-07 MED ORDER — "INSULIN SYRINGE 31G X 5/16"" 0.3 ML MISC": 1 refills | Status: DC

## 2017-08-07 MED ORDER — DULAGLUTIDE 0.75 MG/0.5ML ~~LOC~~ SOAJ: 0.7500 mg | SUBCUTANEOUS | 3 refills | Status: DC

## 2017-08-07 MED ORDER — INSULIN GLARGINE 300 UNIT/ML ~~LOC~~ SOPN: 25.0000 [IU] | PEN_INJECTOR | Freq: Every day | SUBCUTANEOUS | 1 refills | Status: DC

## 2017-08-07 NOTE — Patient Instructions (Addendum)
   In terms of management of diabetes, start trulicity once a week along with metformin and toujeo as prescribed. Start with toujeo 25 units at night. Increase your insulin by 2 units every 4 nights until you reach a fasting blood sugar of 120. Do not go beyond 34 units per night without contact our office.   Your results indicate you have a UTI. I have given you a prescription for an antibiotic. Please take with food. I have sent off a urine culture and we should have those results in 48 hours. If your symptoms worsen while you are awaiting these results or you develop fever, chills, flank pian, nausea and vomiting, please seek care immediately.   IF you received an x-ray today, you will receive an invoice from [facility]. Please contact [facility] at [phone] with questions or concerns regarding your invoice.   IF you received labwork today, you will receive an invoice from [place]. Please contact [facility] at [phone] with questions or concerns regarding your invoice.   Our billing staff will not be able to assist you with questions regarding bills from these companies.  You will be contacted with the lab results as soon as they are available. The fastest way to get your results is to activate your My Chart account. Instructions are located on the last page of this paperwork. If you have not heard from [place] regarding the results in 2 weeks, please contact this office.

## 2017-08-07 NOTE — Progress Notes (Signed)
08/07/2017 at 1:42 PM  Jeanette Ellis / DOB: 11-21-1950 / MRN: [id number]  The patient has Essential hypertension; History of sarcoidosis; Hyperlipemia; Type 2 diabetes mellitus without complication, with long-term current use of insulin ([place]); Iron deficiency anemia; and Anemia associated with stage 3 chronic renal failure (HCC) on their problem list.  SUBJECTIVE  Jeanette Ellis is a 67 y.o. female who complains of urinary frequency, urinary urgency and suprapubic pressure x 2 weeks. She denies dysuria, hematuria, flank pain, genital irritation and vaginal discharge. Has not tried anything with no relief. Most recent UTI prior to this was 06/26/17, sx resolved after this. She is not sexually active.   Referral to endocrinology was placed. Has not heard from them yet. Las A1C one month ago was 8.2. Recommended increasing toujeo depending on FBS readings. Has been out of toujeo for the past week, needs refill. When on toujeo, reports fasting blood glucose running at 130. Since she has been out, it has been running in 190s. Still taking metformin '1000mg'$  BID. Used to use invokamet but insurance stopped covering it so she stopped it.Has also trulicity, Victoza, and Jardiance in the past.  Would really like to start Trulicity again.she has not really changed diet. Still eating "junk food." trying to walk a little daily.  Denies polydipsia, polyphagia, blurry vision, abdominal pain, chest pain, shortness of breath, and dry mouth.  No PMH of pancreatitis of thyroid cancer. No FH of thyroid cancer.   She  has a past medical history of Anemia associated with stage 3 chronic renal failure ([place]) (03/11/2017), Diabetes mellitus without complication ([place]), High cholesterol, Hypertension, Iron deficiency anemia (03/11/2017), and Sarcoidosis.    Medications reviewed and updated by myself where necessary, and exist elsewhere in the encounter.   Ms. Rader has No Known Allergies. She  reports that she has  never smoked. She has never used smokeless tobacco. She reports that she does not drink alcohol or use drugs. She  reports that she does not currently engage in sexual activity. The patient  has a past surgical history that includes Abdominal hysterectomy.  Her family history includes Diabetes in her father; High blood pressure in her mother.  Review of Systems  Constitutional: Negative for chills, diaphoresis and fever.  Eyes: Negative for blurred vision.  Gastrointestinal: Negative for nausea and vomiting.  Neurological: Negative for dizziness, tingling and focal weakness.    OBJECTIVE  Her  height is 5' 7.56" (1.716 m) and weight is 195 lb 1.6 oz (88.5 kg). Her oral temperature is 98.3 F (36.8 C). Her blood pressure is 120/64 and her pulse is 85. Her respiration is 18 and oxygen saturation is 97%.  The patient's body mass index is 30.05 kg/m.  Physical Exam  Constitutional: She is oriented to person, place, and time. She appears well-developed and well-nourished. She does not appear ill.  HENT:  Head: Normocephalic and atraumatic.  Mouth/Throat: Uvula is midline, oropharynx is clear and moist and mucous membranes are normal. No tonsillar exudate.  Eyes: Pupils are equal, round, and reactive to light. Conjunctivae and EOM are normal.  Neck: Normal range of motion.  Pulmonary/Chest: Effort normal.  Abdominal: Normal appearance and bowel sounds are normal. There is no tenderness. There is no CVA tenderness.  Neurological: She is alert and oriented to person, place, and time.  Skin: Skin is warm and dry.  Psychiatric: She has a normal mood and affect.  Vitals reviewed.   Results for orders placed or performed in visit  on 08/07/17 (from the past 24 hour(s))  POCT urinalysis dipstick     Status: Abnormal   Collection Time: 08/07/17 10:19 AM  Result Value Ref Range   Color, UA yellow yellow   Clarity, UA hazy (A) clear   Glucose, UA negative negative mg/dL   Bilirubin, UA negative  negative   Ketones, POC UA negative negative mg/dL   Spec Grav, UA 1.015 1.010 - 1.025   Blood, UA trace-intact (A) negative   pH, UA 5.5 5.0 - 8.0   Protein Ur, POC trace (A) negative mg/dL   Urobilinogen, UA 0.2 0.2 or 1.0 E.U./dL   Nitrite, UA Negative Negative   Leukocytes, UA Small (1+) (A) Negative  POCT glucose (manual entry)     Status: Abnormal   Collection Time: 08/07/17 10:29 AM  Result Value Ref Range   POC Glucose 257 (A) 70 - 99 mg/dl  POCT Microscopic Urinalysis ([facility])     Status: Abnormal   Collection Time: 08/07/17 10:40 AM  Result Value Ref Range   WBC,UR,HPF,POC Moderate (A) None WBC/hpf   RBC,UR,HPF,POC None None RBC/hpf   Bacteria Moderate (A) None, Too numerous to count   Mucus Absent Absent   Epithelial Cells, UR Per Microscopy Few (A) None, Too numerous to count cells/hpf    ASSESSMENT & PLAN  [name] was seen today for urinary frequency, abdominal pain and medication refill.  Diagnoses and all orders for this visit:  Type 2 diabetes mellitus without complication, with long-term current use of insulin (HCC) -     POCT glucose (manual entry) -     CMP14+EGFR -     TSH -     Insulin Glargine (TOUJEO SOLOSTAR) 300 UNIT/ML SOPN; Inject 25 Units into the skin daily. -     Discontinue: Insulin Syringe-Needle U-100 (INSULIN SYRINGE .3CC/31GX5/16") 31G X 5/16" 0.3 ML MISC; Use with Toujeo pen once a day -     Dulaglutide (TRULICITY) 7.89 FY/1.0FB SOPN; Inject 0.75 mg into the skin once a week. SubQ: Do not inject intravenously or intramuscularly. Inject subcutaneously into the upper arm, thigh, or abdomen; when administering within the same body region, use a different injection site each week. Administer once weekly on the same day each week, without regard to meals or time of day. The day of weekly administration may be changed, as long as the last dose was administered >3 days before. If using concomitantly with insulin, administer as separate injections (do not  mix); may inject in the same body region as insulin, but not adjacent to one another.  Urinary frequency -     POCT urinalysis dipstick -     POCT Microscopic Urinalysis ([facility])  Leukocytes in urine -     Urine Culture -     cephALEXin (KEFLEX) 500 MG capsule; Take 1 capsule (500 mg total) by mouth 2 (two) times daily for 7 days.   UA with small leukocytes and moderate WBCs.  Will treat empirically for UTI at this time.  Urine culture pending.  Educated patient that urinary frequency can also be a sign of uncontrolled diabetes, which patient is experiencing right now.  POC glucose 257.  She is asymptomatic.  No ketonuria or glucosuria.  We will add Trulicity to current regimen Toujeo 25 units nightly and Metformin 1000 mg twice daily.  Encouraged to check fasting blood sugar every morning.  Goal is 120.  May increase insulin by 2 units every 4 nights until morning fasting blood glucose is 120.  Educated to  not go beyond 34 units without contacting our office.  Also discussed lifestyle modifications such as dietary changes and increasing exercise.  Plan to follow-up in about 7 weeks for reevaluation of diabetes.  Advised to return sooner if any symptoms worsen or she develops any concerning symptoms.  Tenna Delaine, PA-C  Primary Care at [facility] 08/07/2017 1:42 PM

## 2017-08-08 LAB — CMP14+EGFR
ALT: 14 IU/L (ref 0–32)
AST: 21 IU/L (ref 0–40)
Albumin/Globulin Ratio: 1.4 (ref 1.2–2.2)
Albumin: 4.2 g/dL (ref 3.6–4.8)
Alkaline Phosphatase: 71 IU/L (ref 39–117)
BUN/Creatinine Ratio: 13 (ref 12–28)
BUN: 11 mg/dL (ref 8–27)
Bilirubin Total: 0.6 mg/dL (ref 0.0–1.2)
CO2: 25 mmol/L (ref 20–29)
Calcium: 9.3 mg/dL (ref 8.7–10.3)
Chloride: 97 mmol/L (ref 96–106)
Creatinine, Ser: 0.86 mg/dL (ref 0.57–1.00)
GFR calc Af Amer: 80 mL/min/{1.73_m2} (ref >59)
GFR calc non Af Amer: 69 mL/min/{1.73_m2} (ref >59)
Globulin, Total: 3 g/dL (ref 1.5–4.5)
Glucose: 237 mg/dL — ABNORMAL HIGH (ref 65–99)
Potassium: 4.4 mmol/L (ref 3.5–5.2)
Sodium: 137 mmol/L (ref 134–144)
Total Protein: 7.2 g/dL (ref 6.0–8.5)

## 2017-08-08 LAB — TSH: TSH: 3.5 u[IU]/mL (ref 0.450–4.500)

## 2017-08-09 ENCOUNTER — Ambulatory Visit (HOSPITAL_BASED_OUTPATIENT_CLINIC_OR_DEPARTMENT_OTHER): Payer: PPO

## 2017-08-09 LAB — URINE CULTURE

## 2017-08-13 ENCOUNTER — Ambulatory Visit (HOSPITAL_BASED_OUTPATIENT_CLINIC_OR_DEPARTMENT_OTHER): Payer: PPO

## 2017-08-21 ENCOUNTER — Encounter: Payer: Self-pay | Admitting: Physician Assistant

## 2017-08-21 DIAGNOSIS — E11319 Type 2 diabetes mellitus with unspecified diabetic retinopathy without macular edema: Secondary | ICD-10-CM | POA: Insufficient documentation

## 2017-08-27 ENCOUNTER — Ambulatory Visit (HOSPITAL_BASED_OUTPATIENT_CLINIC_OR_DEPARTMENT_OTHER)
Admission: RE | Admit: 2017-08-27 | Discharge: 2017-08-27 | Disposition: A | Discharge status: Home / Self Care | Payer: PPO | Source: Ambulatory Visit | Attending: Pulmonary Disease | Admitting: Pulmonary Disease

## 2017-08-27 DIAGNOSIS — I10 Essential (primary) hypertension: Secondary | ICD-10-CM | POA: Insufficient documentation

## 2017-08-27 DIAGNOSIS — R0602 Shortness of breath: Secondary | ICD-10-CM

## 2017-08-27 DIAGNOSIS — E119 Type 2 diabetes mellitus without complications: Secondary | ICD-10-CM | POA: Diagnosis not present

## 2017-08-27 NOTE — Progress Notes (Signed)
  Echocardiogram 2D Echocardiogram has been performed.  Dorothey Baseman 08/27/2017, 11:52 AM

## 2017-08-31 ENCOUNTER — Other Ambulatory Visit: Payer: Self-pay | Admitting: Physician Assistant

## 2017-08-31 DIAGNOSIS — Z794 Long term (current) use of insulin: Principal | ICD-10-CM

## 2017-08-31 DIAGNOSIS — E119 Type 2 diabetes mellitus without complications: Secondary | ICD-10-CM

## 2017-09-03 ENCOUNTER — Other Ambulatory Visit (HOSPITAL_BASED_OUTPATIENT_CLINIC_OR_DEPARTMENT_OTHER): Payer: PPO

## 2017-09-04 ENCOUNTER — Ambulatory Visit: Payer: PPO | Admitting: Pulmonary Disease

## 2017-09-05 ENCOUNTER — Other Ambulatory Visit (HOSPITAL_BASED_OUTPATIENT_CLINIC_OR_DEPARTMENT_OTHER): Payer: PPO

## 2017-09-06 ENCOUNTER — Ambulatory Visit (INDEPENDENT_AMBULATORY_CARE_PROVIDER_SITE_OTHER): Payer: PPO | Admitting: Pulmonary Disease

## 2017-09-06 ENCOUNTER — Encounter: Payer: Self-pay | Admitting: Pulmonary Disease

## 2017-09-06 VITALS — BP 118/68 | HR 72 | Ht 66.0 in | Wt 195.2 lb

## 2017-09-06 DIAGNOSIS — D869 Sarcoidosis, unspecified: Secondary | ICD-10-CM

## 2017-09-06 DIAGNOSIS — R0602 Shortness of breath: Secondary | ICD-10-CM | POA: Diagnosis not present

## 2017-09-06 DIAGNOSIS — R768 Other specified abnormal immunological findings in serum: Secondary | ICD-10-CM

## 2017-09-06 LAB — PULMONARY FUNCTION TEST
DL/VA % pred: 79 %
DL/VA: 4.03 ml/min/mmHg/L
DLCO cor % pred: 55 %
DLCO cor: 14.87 ml/min/mmHg
DLCO unc % pred: 50 %
DLCO unc: 13.58 ml/min/mmHg
FEF 25-75 Post: 2.59 L/sec
FEF 25-75 Pre: 1.8 L/sec
FEF2575-%Change-Post: 43 %
FEF2575-%Pred-Post: 139 %
FEF2575-%Pred-Pre: 97 %
FEV1-%Change-Post: 10 %
FEV1-%Pred-Post: 101 %
FEV1-%Pred-Pre: 92 %
FEV1-Post: 2.07 L
FEV1-Pre: 1.88 L
FEV1FVC-%Change-Post: 5 %
FEV1FVC-%Pred-Pre: 101 %
FEV6-%Change-Post: 5 %
FEV6-%Pred-Post: 98 %
FEV6-%Pred-Pre: 93 %
FEV6-Post: 2.48 L
FEV6-Pre: 2.35 L
FEV6FVC-%Change-Post: 0 %
FEV6FVC-%Pred-Post: 104 %
FEV6FVC-%Pred-Pre: 104 %
FVC-%Change-Post: 4 %
FVC-%Pred-Post: 94 %
FVC-%Pred-Pre: 90 %
FVC-Post: 2.48 L
FVC-Pre: 2.37 L
Post FEV1/FVC ratio: 83 %
Post FEV6/FVC ratio: 100 %
Pre FEV1/FVC ratio: 79 %
Pre FEV6/FVC Ratio: 100 %
RV % pred: 79 %
RV: 1.81 L
TLC % pred: 79 %
TLC: 4.23 L

## 2017-09-06 NOTE — Progress Notes (Addendum)
Jeanette Ellis    [id number]    Sep 24, 1950  Primary Care Physician:Wiseman, Gerald Stabs, PA-C  Referring Physician: Magdalene River, PA-C [address] [place], [place] [address]  Chief complaint: Follow up for sarcoidosis  HPI: 67 year old with history of sarcoidosis.  Diagnosed more than 30 years ago with a mediastinoscopy and had been treated with intermittent prednisone.  She had not required any therapy for sarcoidosis for many years.  She used to follow with Dr. Kriste Basque  Seen by her primary care doctor in late March for episode of bronchitis with wheezing and given a prednisone taper.  This has improved her symptoms.  She still has some productive cough with white mucus and occasional episodes of dyspnea with wheezing.  Denies any fevers, chills  Pets: None Occupation: Used to work in [employer].  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  However she does not report any overt mold exposure.  No hot tubs Smoking history: Never smoker Travel History: Lived in [place].  No other significant travel Family history: 2 siblings with sarcoidosis.  Interim history: Started on breo at last visit.  States that she is doing well with the inhaler with improvement in dyspnea and cough She still has occasional productive cough with clear white mucus.  Denies any wheezing, production, fevers, chills.  Outpatient Encounter Medications as of 09/06/2017  Medication Sig  . aspirin EC 81 MG tablet Take 81 mg by mouth.  Marland Kitchen atorvastatin (LIPITOR) 20 MG tablet Take 1 tablet (20 mg total) by mouth daily.  . Dulaglutide (TRULICITY) 0.75 MG/0.5ML SOPN Inject 0.75 mg into the skin once a week. SubQ: Do not inject intravenously or intramuscularly. Inject subcutaneously into the upper arm, thigh, or abdomen; when administering within the same body region, use a different injection site each week. Administer once weekly on the same day each week,  without regard to meals or time of day. The day of weekly administration may be changed, as long as the last dose was administered >3 days before. If using concomitantly with insulin, administer as separate injections (do not mix); may inject in the same body region as insulin, but not adjacent to one another.  . fluticasone furoate-vilanterol (BREO ELLIPTA) 200-25 MCG/INH AEPB Inhale 1 puff into the lungs daily.  . Insulin Glargine (TOUJEO SOLOSTAR) 300 UNIT/ML SOPN Inject 25 Units into the skin daily.  Marland Kitchen lisinopril (PRINIVIL,ZESTRIL) 10 MG tablet Take 1 tablet (10 mg total) by mouth daily.  . metFORMIN (GLUCOPHAGE-XR) 500 MG 24 hr tablet TAKE 2 TABLETS BY MOUTH TWICE DAILY WITH A MEAL  . Multiple Vitamin (MULTIVITAMIN WITH MINERALS) TABS tablet Take 1 tablet by mouth daily.  . [DISCONTINUED] ferrous sulfate 325 (65 FE) MG EC tablet Take 1 tablet (325 mg total) by mouth once for 1 dose.   No facility-administered encounter medications on file as of 09/06/2017.     Allergies as of 09/06/2017  . (No Known Allergies)    Past Medical History:  Diagnosis Date  . Anemia associated with stage 3 chronic renal failure (HCC) 03/11/2017  . Diabetes mellitus without complication (HCC)   . High cholesterol   . Hypertension   . Iron deficiency anemia 03/11/2017  . Sarcoidosis     Past Surgical History:  Procedure Laterality Date  . ABDOMINAL HYSTERECTOMY      Family History  Problem Relation Age of Onset  . High blood pressure Mother   .  Diabetes Father     Social History   Socioeconomic History  . Marital status: Married    Spouse name: Zynzulu  . Number of children: 4  . Years of education: Not on file  . Highest education level: Not on file  Occupational History  . Not on file  Social Needs  . Financial resource strain: Not on file  . Food insecurity:    Worry: Not on file    Inability: Not on file  . Transportation needs:    Medical: Not on file    Non-medical: Not on file    Tobacco Use  . Smoking status: Never Smoker  . Smokeless tobacco: Never Used  Substance and Sexual Activity  . Alcohol use: No  . Drug use: No  . Sexual activity: Not Currently  Lifestyle  . Physical activity:    Days per week: Not on file    Minutes per session: Not on file  . Stress: Not on file  Relationships  . Social connections:    Talks on phone: Not on file    Gets together: Not on file    Attends religious service: Not on file    Active member of club or organization: Not on file    Attends meetings of clubs or organizations: Not on file    Relationship status: Not on file  . Intimate partner violence:    Fear of current or ex partner: Not on file    Emotionally abused: Not on file    Physically abused: Not on file    Forced sexual activity: Not on file  Other Topics Concern  . Not on file  Social History Narrative  . Not on file    Review of systems: Review of Systems  Constitutional: Negative for fever and chills.  HENT: Negative.   Eyes: Negative for blurred vision.  Respiratory: as per HPI  Cardiovascular: Negative for chest pain and palpitations.  Gastrointestinal: Negative for vomiting, diarrhea, blood per rectum. Genitourinary: Negative for dysuria, urgency, frequency and hematuria.  Musculoskeletal: Negative for myalgias, back pain and joint pain.  Skin: Negative for itching and rash.  Neurological: Negative for dizziness, tremors, focal weakness, seizures and loss of consciousness.  Endo/Heme/Allergies: Negative for environmental allergies.  Psychiatric/Behavioral: Negative for depression, suicidal ideas and hallucinations.  All other systems reviewed and are negative.  Physical Exam: Blood pressure 118/68, pulse 72, height  (1.676 m), weight 195 lb 3.2 oz (88.5 kg), SpO2 99 %. Gen:      No acute distress HEENT:  EOMI, sclera anicteric Neck:     No masses; no thyromegaly Lungs:    Clear to auscultation bilaterally; normal respiratory  effort CV:         Regular rate and rhythm; no murmurs Abd:      + bowel sounds; soft, non-tender; no palpable masses, no distension Ext:    No edema; adequate peripheral perfusion Skin:      Warm and dry; no rash Neuro: alert and oriented x 3 Psych: normal mood and affect  Data Reviewed: CT chest 01/09/2016- stable calcified mediastinal adenopathy, bilateral pulmonary nodules.  Honeycombing at the lung bases. CT chest 07/12/2015- numerous bilateral subcentimeter pulmonary nodules.  Mediastinal, hilar adenopathy, peripheral fibrosis. High-resolution CT chest 08/02/2017- biapical scarring, scattered pulmonary nodes stable from 2017.  Paraseptal emphysematous changes with blebs.  Probable honeycombing. I have reviewed the images personally.   Echo 08/27/17- Left ventricular systolic function is preserved visually estimated at 60 to 65%. Impaired left ventricular  relaxation. PA pressure is normal.  FENO 08/31/2017-18  Labs Serologies 08/01/2017-ANA 1:640, homogeneous pattern Angiotensin-converting enzyme-28, rheumatoid factor, CCP-negative  CBC 08/01/2017-WBC 3.6, eos 5.8%, absolute eosinophil count 200 Blood allergy profile 08/01/2017-IgE to 24, RAST panel shows sensitivity to cat, dog, dust mite, tree pollen  PFTs 09/06/2017 FVC 2.48 [9 4%), FEV1 2.07 [101%], F/F 83, TLC 79%, DLCO 50% Minimal restriction with moderate diffusion defect.  Assessment:  Sarcoidosis She has history of long-standing sarcoidosis which was stable on prior imaging in 2017 which shows stable calcified mediastinal adenopathy and pulmonary nodules, peripheral scarring.  Follow-up CT reviewed with stable findings In addition there are emphysematous changes with possible honeycombing at the base.  This is not suggestive of UIP pattern.  Serologies noted for elevated ANA.  Refer her to rheumatology for further evaluation.  Echo does not show evidence of pulmonary HTN.  She has responded well to [facility].  Continue the  same.  Health maintenance 05/11/2016-influenza 06/07/2014- Prevnar 09/08/2015-Pneumovax  Plan/Recommendations: - Continue Breo inhaler. - Rheumatology evaluation  Chilton Greathouse MD [place] [facility] 09/06/2017, 10:42 AM  CC: [name], PA*  Addendum: Received note from [facility] rheumatology, Dr. Zenovia Jordan.  Office note dated 09/05/2018   Evaluated for positive ANA Being sent  for confirmatory test for lupus.  Unless she is having positive symptoms she will not get treated.  Chilton Greathouse MD [place] [facility] 09/22/2018, 1:19 PM   Received note from [facility] rheumatology dated 03/18/2019 Labs for positive ANA, positive SSA.  No symptoms concerning for history of Sjogren's.  No treatment needed at present  Chilton Greathouse MD [place] [facility] 05/17/2019, 3:21 PM

## 2017-09-06 NOTE — Progress Notes (Signed)
Patient completed full PFT today. 

## 2017-09-06 NOTE — Patient Instructions (Signed)
Will refer you to a rheumatologist for positive ANA I have reviewed the CT scan which shows sarcoidosis is stable I am glad that you are feeling better with breo Follow-up in 3 months.

## 2017-09-18 NOTE — Progress Notes (Signed)
Patient ID: Jeanette Ellis, female   DOB: [birth year], 67 y.o.   MRN: [id number]           Reason for Appointment: Consultation for Type 2 Diabetes  Referring PJK:DTOIZTIW Timmothy Euler   History of Present Illness:          Date of diagnosis of type 2 diabetes mellitus: 1998       Background history:   She has been on various treatments for her diabetes over the last several years and mostly based on metformin Recent records indicate that she had been given a trial of Invokana but at least last year and also Trulicity recently Not clear what her previous level of control was but her A1c has been between 8 and 9 in 2018 Because of persistently high readings she was started on Toujeo insulin in 11/2016 However her blood sugars were still high with A1c 9% in December partly related to her not being able to afford her medications and not continuing insulin prior to her visit  Recent history:   INSULIN regimen is:  TOUJEO 25 U in am      Non-insulin hypoglycemic drugs the patient is taking are: Metformin ER, 2 tablets twice daily  Current management, blood sugar patterns and problems identified:  She has been on the same dose of Toujeo for some time  Her blood sugars were significantly high when she saw her PCP and at that time she was not taking her Lantus because of cost; her  lab glucose was 237  Since then she has also been given a sample of Trulicity by her PCP but she stopped taking this 2 weeks ago on her own and does not have a prescription to continue this  She is checking her blood sugars sporadically with a [employer] meter but mostly doing readings in the mornings and not consistently  Although she thinks she is trying to overall be more compliant she has gained weight in the last month or so by 8 pounds  She does not monitor readings after meals, today 3 hours after eating oatmeal her blood sugar was 78  She is frequently eating fast food and does not always eat low-fat  meals  This is despite having seen the dietitian less than a year ago  She does try to walk fairly regularly       Side effects from medications: None  Compliance with the medical regimen: Variable  Hypoglycemia:  none  Glucose monitoring:  done?  1 times a day         Glucometer: [employer] brand      Blood Glucose readings by recall:  Fasting readings recently 90-110  Self-care: The diet that the patient has been following is: tries to limit sugar.      Typical meal intake: Breakfast is oatmeal grits or cereal  Eating out 4 days a week at [employer], [employer] or [place] [employer]              Dietician visit, most recent:9/18               Exercise: Walk 3-4/7 30 min   Weight history:  Wt Readings from Last 3 Encounters:  09/19/17 203 lb 12.8 oz (92.4 kg)  09/06/17 195 lb 3.2 oz (88.5 kg)  08/07/17 195 lb 1.6 oz (88.5 kg)    Glycemic control:   Lab Results  Component Value Date   HGBA1C 7.6 (A) 09/19/2017   HGBA1C 8.2a 06/26/2017   HGBA1C 9.0  03/25/2017   Lab Results  Component Value Date   LDLCALC 131 (H) 03/25/2017   CREATININE 0.86 08/07/2017   No results found for: MICRALBCREAT  No results found for: FRUCTOSAMINE    Allergies as of 09/19/2017   No Known Allergies     Medication List        Accurate as of 09/19/17 12:10 PM. Always use your most recent med list.          aspirin EC 81 MG tablet Take 81 mg by mouth.   atorvastatin 20 MG tablet Commonly known as:  LIPITOR Take 1 tablet (20 mg total) by mouth daily.   fluticasone furoate-vilanterol 200-25 MCG/INH Aepb Commonly known as:  BREO ELLIPTA Inhale 1 puff into the lungs daily.   Insulin Glargine 300 UNIT/ML Sopn Commonly known as:  TOUJEO SOLOSTAR Inject 25 Units into the skin daily.   lisinopril 10 MG tablet Commonly known as:  PRINIVIL,ZESTRIL Take 1 tablet (10 mg total) by mouth daily.   metFORMIN 500 MG 24 hr tablet Commonly known as:  GLUCOPHAGE-XR TAKE 2 TABLETS BY MOUTH  TWICE DAILY WITH A MEAL   multivitamin with minerals Tabs tablet Take 1 tablet by mouth daily.       Allergies: No Known Allergies  Past Medical History:  Diagnosis Date  . Anemia associated with stage 3 chronic renal failure ([place]) 03/11/2017  . Diabetes mellitus without complication ([place])   . High cholesterol   . Hypertension   . Iron deficiency anemia 03/11/2017  . Sarcoidosis     Past Surgical History:  Procedure Laterality Date  . ABDOMINAL HYSTERECTOMY      Family History  Problem Relation Age of Onset  . High blood pressure Mother   . Diabetes Father     Social History:  reports that she has never smoked. She has never used smokeless tobacco. She reports that she does not drink alcohol or use drugs.   Review of Systems  Constitutional: Positive for weight gain.  HENT: Negative for trouble swallowing.   Eyes: Negative for blurred vision.  Respiratory: Negative for shortness of breath.   Cardiovascular: Negative for leg swelling.  Gastrointestinal: Negative for diarrhea and abdominal pain.  Endocrine: Negative for fatigue.  Genitourinary: Negative for nocturia.  Musculoskeletal: Negative for joint pain.  Skin: Negative for rash.  Neurological: Negative for numbness and tingling.  History of sarcoidosis, not active, no recent use of prednisone   Lipid history: On Lipitor, no recent labs available    Lab Results  Component Value Date   CHOL 234 (H) 03/25/2017   HDL 76 03/25/2017   LDLCALC 131 (H) 03/25/2017   TRIG 134 03/25/2017   CHOLHDL 3.1 03/25/2017           Hypertension: Treated with lisinopril 10 mg  BP Readings from Last 3 Encounters:  09/19/17 138/80  09/06/17 118/68  08/07/17 120/64    Most recent eye exam was in 12/18, has moderate nonproliferative retinopathy and is due for follow-up soon  Most recent foot exam: 6/19    LABS:  Office Visit on 09/19/2017  Component Date Value Ref Range Status  . POC Glucose 09/19/2017 78  70 -  99 mg/dl Final  . Hemoglobin A1C 09/19/2017 7.6* 4.0 - 5.6 % Final    Physical Examination:  BP 138/80 (BP Location: Left Arm, Patient Position: Sitting, Cuff Size: Normal)   Pulse 68   Ht '5\' 6"'$  (1.676 m)   Wt 203 lb 12.8 oz (92.4 kg)  SpO2 98%   BMI 32.89 kg/m   GENERAL:         Patient has generalized obesity.    HEENT:         Eye exam shows normal external appearance.  Fundus exam shows no retinopathy, details not well seen.  Oral exam shows normal mucosa .  NECK:   There is no lymphadenopathy Thyroid is not enlarged and no nodules felt.  Carotids are normal to palpation and no bruit heard  LUNGS:         Chest is symmetrical. Lungs are clear to auscultation.Marland Kitchen   HEART:         Heart sounds:  S1 and S2 are normal. No murmur or click heard., no S3 or S4.   ABDOMEN:   There is no distention present. Liver and spleen are not palpable. No other mass or tenderness present.    NEUROLOGICAL:   Ankle jerks are absent bilaterally.    Diabetic Foot Exam - Simple   Simple Foot Form Diabetic Foot exam was performed with the following findings:  Yes 09/19/2017 12:09 PM  Visual Inspection No deformities, no ulcerations, no other skin breakdown bilaterally:  Yes See comments:  Yes Sensation Testing Intact to touch and monofilament testing bilaterally:  Yes Pulse Check Posterior Tibialis and Dorsalis pulse intact bilaterally:  Yes Comments Thickened dark great toenails bilaterally           . MUSCULOSKELETAL:  There is no swelling or deformity of the peripheral joints.     EXTREMITIES:     There is no edema.  SKIN:       No rash or lesions of concern.        ASSESSMENT:  Diabetes type 2, uncontrolled with A1c usually over 8%  Today her A1c is slightly better at 7.6  Currently only on a program of basal insulin and metformin She has difficulty affording brand-new medications Also not clear if she has previously benefited from Trulicity or Invokana met which have been  tried At the present is having fairly good fasting blood sugars with Lantus and full dose of metformin but not clear if her postprandial readings are still high She can do better with meal planning with balanced meals especially carbohydrate and cutting out higher fat meals when eating out especially  Considering her BMI of 32.9 she does need to lose weight  Complications of diabetes: Microalbuminuria, background retinopathy  HYPERTENSION: Mild and well controlled with lisinopril  LIPIDS: Followed by PCP, will need follow-up since likely her hypercholesterolemia was not controlled in December from not taking medication as directed   PLAN:    She will check to see which brand of glucose monitor is covered by insurance instead of using the [employer] brand  Meantime she will start checking blood sugars at least every other day after 1 of her meals to establish blood sugar better  We will consider starting Trulicity or switching to Invokamet  if blood sugars are consistently high after meals  Also this will enable weight loss which she has not been able to achieve  Since her fasting readings are fairly good she has no hypoglycemia she can continue her metformin and Lantus unchanged for now  Follow-up with dietitian for meal planning, referral placed  She will try to add protein with her breakfast consistently and not just eat carbohydrate rich foods  Needs to cut back on fried and breaded foods especially when going out  Reassess blood sugar control with fructosamine in  6 weeks  To follow-up with ophthalmologist for retinopathy, discussed need for better control to prevent progression  Regular foot exams  Patient Instructions  Check blood sugars on waking up 3/7 days   Also check blood sugars about 2 hours after a meal and do this after different meals by rotation  Recommended blood sugar levels on waking up is 90-130 and about 2 hours after meal is 130-160  Please bring your  blood sugar monitor to each visit, thank you   Counseling time on subjects discussed in assessment and plan sections is over 50% of today's 60 minute visit   Consultation note has been sent to the referring physician  Elayne Snare 09/19/2017, 12:10 PM   Note: This office note was prepared with Dragon voice recognition system technology. Any transcriptional errors that result from this process are unintentional.

## 2017-09-19 ENCOUNTER — Encounter: Payer: Self-pay | Admitting: Endocrinology

## 2017-09-19 ENCOUNTER — Ambulatory Visit (INDEPENDENT_AMBULATORY_CARE_PROVIDER_SITE_OTHER): Payer: PPO | Admitting: Endocrinology

## 2017-09-19 VITALS — BP 138/80 | HR 68 | Ht 66.0 in | Wt 203.8 lb

## 2017-09-19 DIAGNOSIS — E1129 Type 2 diabetes mellitus with other diabetic kidney complication: Secondary | ICD-10-CM

## 2017-09-19 DIAGNOSIS — IMO0002 Reserved for concepts with insufficient information to code with codable children: Secondary | ICD-10-CM

## 2017-09-19 DIAGNOSIS — I10 Essential (primary) hypertension: Secondary | ICD-10-CM | POA: Diagnosis not present

## 2017-09-19 DIAGNOSIS — E1165 Type 2 diabetes mellitus with hyperglycemia: Secondary | ICD-10-CM

## 2017-09-19 DIAGNOSIS — E113299 Type 2 diabetes mellitus with mild nonproliferative diabetic retinopathy without macular edema, unspecified eye: Secondary | ICD-10-CM

## 2017-09-19 DIAGNOSIS — R809 Proteinuria, unspecified: Secondary | ICD-10-CM | POA: Diagnosis not present

## 2017-09-19 DIAGNOSIS — Z794 Long term (current) use of insulin: Secondary | ICD-10-CM

## 2017-09-19 DIAGNOSIS — E782 Mixed hyperlipidemia: Secondary | ICD-10-CM | POA: Diagnosis not present

## 2017-09-19 LAB — POCT GLYCOSYLATED HEMOGLOBIN (HGB A1C): Hemoglobin A1C: 7.6 % — AB (ref 4.0–5.6)

## 2017-09-19 LAB — GLUCOSE, POCT (MANUAL RESULT ENTRY): POC Glucose: 78 mg/dl (ref 70–99)

## 2017-09-19 NOTE — Patient Instructions (Signed)
Check blood sugars on waking up  3/7 days  Also check blood sugars about 2 hours after a meal and do this after different meals by rotation  Recommended blood sugar levels on waking up is 90-130 and about 2 hours after meal is 130-160  Please bring your blood sugar monitor to each visit, thank you   

## 2017-09-25 ENCOUNTER — Ambulatory Visit: Payer: PPO | Admitting: Physician Assistant

## 2017-09-26 ENCOUNTER — Ambulatory Visit: Payer: PPO | Admitting: Physician Assistant

## 2017-09-27 ENCOUNTER — Inpatient Hospital Stay: Payer: PPO | Attending: Hematology & Oncology | Admitting: Hematology & Oncology

## 2017-09-27 ENCOUNTER — Inpatient Hospital Stay: Payer: PPO

## 2017-10-01 ENCOUNTER — Ambulatory Visit: Payer: PPO | Admitting: Physician Assistant

## 2017-10-03 DIAGNOSIS — E113213 Type 2 diabetes mellitus with mild nonproliferative diabetic retinopathy with macular edema, bilateral: Secondary | ICD-10-CM | POA: Diagnosis not present

## 2017-10-03 DIAGNOSIS — H43813 Vitreous degeneration, bilateral: Secondary | ICD-10-CM | POA: Diagnosis not present

## 2017-10-03 DIAGNOSIS — H2513 Age-related nuclear cataract, bilateral: Secondary | ICD-10-CM | POA: Diagnosis not present

## 2017-10-03 DIAGNOSIS — H25013 Cortical age-related cataract, bilateral: Secondary | ICD-10-CM | POA: Diagnosis not present

## 2017-10-17 ENCOUNTER — Ambulatory Visit: Payer: PPO | Admitting: Physician Assistant

## 2017-10-22 ENCOUNTER — Ambulatory Visit: Payer: PPO | Admitting: Dietician

## 2017-10-25 ENCOUNTER — Other Ambulatory Visit: Payer: Self-pay | Admitting: Physician Assistant

## 2017-10-25 DIAGNOSIS — Z794 Long term (current) use of insulin: Principal | ICD-10-CM

## 2017-10-25 DIAGNOSIS — E119 Type 2 diabetes mellitus without complications: Secondary | ICD-10-CM

## 2017-10-25 NOTE — Telephone Encounter (Signed)
Metformin refill Last Refill:08/14/17 # 180 Last OV: 08/07/17 PCP: Benjiman CoreBrittany Wiseman PA [facility] [address] Last Hgb A1C 09/19/17=7.6

## 2017-10-28 ENCOUNTER — Other Ambulatory Visit: Payer: Self-pay

## 2017-10-28 ENCOUNTER — Ambulatory Visit (INDEPENDENT_AMBULATORY_CARE_PROVIDER_SITE_OTHER): Payer: PPO | Admitting: Physician Assistant

## 2017-10-28 ENCOUNTER — Ambulatory Visit: Payer: Self-pay | Admitting: Physician Assistant

## 2017-10-28 ENCOUNTER — Encounter: Payer: Self-pay | Admitting: Physician Assistant

## 2017-10-28 VITALS — BP 137/81 | HR 81 | Temp 99.2°F | Resp 20 | Ht 68.58 in | Wt 202.0 lb

## 2017-10-28 DIAGNOSIS — Z794 Long term (current) use of insulin: Secondary | ICD-10-CM | POA: Diagnosis not present

## 2017-10-28 DIAGNOSIS — J209 Acute bronchitis, unspecified: Secondary | ICD-10-CM | POA: Diagnosis not present

## 2017-10-28 DIAGNOSIS — E119 Type 2 diabetes mellitus without complications: Secondary | ICD-10-CM

## 2017-10-28 LAB — POCT CBC
Granulocyte percent: 66.7 %G (ref 37–80)
HCT, POC: 31.9 % — AB (ref 37.7–47.9)
Hemoglobin: 10 g/dL — AB (ref 12.2–16.2)
Lymph, poc: 1.4 (ref 0.6–3.4)
MCH, POC: 26.2 pg — AB (ref 27–31.2)
MCHC: 31.3 g/dL — AB (ref 31.8–35.4)
MCV: 83.6 fL (ref 80–97)
MID (cbc): 0.4 (ref 0–0.9)
MPV: 8.7 fL (ref 0–99.8)
POC Granulocyte: 3.6 (ref 2–6.9)
POC LYMPH PERCENT: 25.1 %L (ref 10–50)
POC MID %: 8.2 %M (ref 0–12)
Platelet Count, POC: 185 10*3/uL (ref 142–424)
RBC: 3.81 M/uL — AB (ref 4.04–5.48)
RDW, POC: 13.2 %
WBC: 5.4 10*3/uL (ref 4.6–10.2)

## 2017-10-28 LAB — GLUCOSE, POCT (MANUAL RESULT ENTRY): POC Glucose: 240 mg/dl — AB (ref 70–99)

## 2017-10-28 MED ORDER — AZELASTINE HCL 0.1 % NA SOLN: 2.0000 | Freq: Two times a day (BID) | NASAL | 0 refills | Status: DC

## 2017-10-28 MED ORDER — ALBUTEROL SULFATE (2.5 MG/3ML) 0.083% IN NEBU
2.5000 mg | INHALATION_SOLUTION | Freq: Once | RESPIRATORY_TRACT | Status: AC
  Administered 2017-10-28: 2.5 mg via RESPIRATORY_TRACT

## 2017-10-28 MED ORDER — IPRATROPIUM BROMIDE 0.02 % IN SOLN
0.5000 mg | Freq: Once | RESPIRATORY_TRACT | Status: AC
  Administered 2017-10-28: 0.5 mg via RESPIRATORY_TRACT

## 2017-10-28 MED ORDER — BENZONATATE 100 MG PO CAPS: 100.0000 mg | ORAL_CAPSULE | Freq: Three times a day (TID) | ORAL | 0 refills | Status: DC | PRN

## 2017-10-28 MED ORDER — BLOOD GLUCOSE MONITOR KIT: PACK | 0 refills | Status: DC

## 2017-10-28 MED ORDER — FREESTYLE LIBRE 14 DAY READER DEVI: 1.0000 | Freq: Every day | 0 refills | Status: DC | PRN

## 2017-10-28 MED ORDER — FREESTYLE LIBRE 14 DAY SENSOR MISC: 1.0000 [IU] | 2 refills | Status: DC

## 2017-10-28 MED ORDER — ALBUTEROL SULFATE HFA 108 (90 BASE) MCG/ACT IN AERS: 2.0000 | INHALATION_SPRAY | RESPIRATORY_TRACT | 1 refills | Status: AC | PRN

## 2017-10-28 MED ORDER — FLUTICASONE FUROATE-VILANTEROL 200-25 MCG/INH IN AEPB: 1.0000 | INHALATION_SPRAY | Freq: Every day | RESPIRATORY_TRACT | 1 refills | Status: DC

## 2017-10-28 NOTE — Progress Notes (Signed)
MRN: [id number] DOB: 11-12-1950  Subjective:   Jeanette Ellis is a 67 y.o. female presenting for chief complaint of Cough (X 1 week - with some wheezing) .  Reports 1 week history of illness. Started out with sore throat and runny nose. Sore throat resolved. Now having productive cough, chest congestion, and wheezing. Has tried OTC theraflu with no full relief. Denies fever, sinus pain, ear pain, shortness of breath and chest pain, nausea, vomiting, abdominal pain and diarrhea. Has had sick contact with grandkids. Has mild history of seasonal allergies and sarcoidosis. No PMH of  Asthma and COPD. Was following with pulm for sarcoidosis. Last visit was 08/2017. Was being well controlled on breo ellipta. Has been out of breo ellipta inhaler for about a month.   Just ate prior to visit.  Denies smoking. Denies any other aggravating or relieving factors, no other questions or concerns.  Jeanette Ellis has a current medication list which includes the following prescription(s): aspirin ec, atorvastatin, insulin glargine, lisinopril, metformin, multivitamin with minerals, blood glucose meter kit and supplies, freestyle libre 14 day reader, freestyle libre 14 day sensor, and fluticasone furoate-vilanterol. Also has No Known Allergies.  Jeanette Ellis  has a past medical history of Anemia associated with stage 3 chronic renal failure ([place]) (03/11/2017), Diabetes mellitus without complication ([place]), High cholesterol, Hypertension, Iron deficiency anemia (03/11/2017), and Sarcoidosis. Also  has a past surgical history that includes Abdominal hysterectomy.   Objective:   Vitals: BP 137/81 (BP Location: Right Arm, Patient Position: Sitting, Cuff Size: Normal)   Pulse 81   Temp 99.2 F (37.3 C) (Oral)   Resp 20   Ht 5' 8.58" (1.742 m)   Wt 202 lb (91.6 kg)   SpO2 98%   PF 200 L/min Comment: Before 1) 180   2) 200   3) 200  BMI 30.19 kg/m   Physical Exam  Constitutional: She is oriented to person, place, and  time. She appears well-developed and well-nourished. No distress.  HENT:  Head: Normocephalic and atraumatic.  Right Ear: Tympanic membrane, external ear and ear canal normal.  Left Ear: Tympanic membrane, external ear and ear canal normal.  Nose: Mucosal edema present. Right sinus exhibits no maxillary sinus tenderness and no frontal sinus tenderness. Left sinus exhibits no maxillary sinus tenderness and no frontal sinus tenderness.  Mouth/Throat: Uvula is midline and mucous membranes are normal. Posterior oropharyngeal erythema present. No posterior oropharyngeal edema or tonsillar abscesses. No tonsillar exudate.  Eyes: Conjunctivae are normal.  Neck: Normal range of motion.  Cardiovascular: Normal rate, regular rhythm, normal heart sounds and intact distal pulses.  Pulmonary/Chest: Effort normal. She has no decreased breath sounds. She has wheezes. She has rhonchi (diffuse throughout b/l lung fields, more prominent on right lung fields). She has no rales.  Lymphadenopathy:       Head (right side): No submental, no submandibular, no tonsillar, no preauricular, no posterior auricular and no occipital adenopathy present.       Head (left side): No submental, no submandibular, no tonsillar, no preauricular, no posterior auricular and no occipital adenopathy present.    She has no cervical adenopathy.       Right: No supraclavicular adenopathy present.       Left: No supraclavicular adenopathy present.  Neurological: She is alert and oriented to person, place, and time.  Skin: Skin is warm and dry.  Psychiatric: She has a normal mood and affect.  Vitals reviewed.   Results for orders placed or performed in  visit on 10/28/17 (from the past 24 hour(s))  POCT glucose (manual entry)     Status: Abnormal   Collection Time: 10/28/17  2:24 PM  Result Value Ref Range   POC Glucose 240 (A) 70 - 99 mg/dl  POCT CBC     Status: Abnormal   Collection Time: 10/28/17  2:31 PM  Result Value Ref Range    WBC 5.4 4.6 - 10.2 K/uL   Lymph, poc 1.4 0.6 - 3.4   POC LYMPH PERCENT 25.1 10 - 50 %L   MID (cbc) 0.4 0 - 0.9   POC MID % 8.2 0 - 12 %M   POC Granulocyte 3.6 2 - 6.9   Granulocyte percent 66.7 37 - 80 %G   RBC 3.81 (A) 4.04 - 5.48 M/uL   Hemoglobin 10.0 (A) 12.2 - 16.2 g/dL   HCT, POC 31.9 (A) 37.7 - 47.9 %   MCV 83.6 80 - 97 fL   MCH, POC 26.2 (A) 27 - 31.2 pg   MCHC 31.3 (A) 31.8 - 35.4 g/dL   RDW, POC 13.2 %   Platelet Count, POC 185 142 - 424 K/uL   MPV 8.7 0 - 99.8 fL   2 breathing tx  given in office (1st-> albuterol only, 2nd->duoneb). Patient reports feeling much better. Lungs CTAB. Peak flow increased from 200 pre breathing treatment to 240 post breathing treatment.   Assessment and Plan :  1. Bronchitis with bronchospasm Likely viral etiology. Wheezing resolved with breathing tx.  Lungs now CTAB. Peak flow improved, which is reassuring. She is overall well appearing, NAD. Vitals stable. WBC wnl. Rec restarting breo ellipta inhaler daily along with albuterol inhaler prn. Follow up in 2 days for reevaluation or sooner if sx worsen.  - albuterol (PROVENTIL) (2.5 MG/3ML) 0.083% nebulizer solution 2.5 mg - POCT CBC - POCT glucose (manual entry) - Care order/instruction - albuterol (PROVENTIL) (2.5 MG/3ML) 0.083% nebulizer solution 2.5 mg - ipratropium (ATROVENT) nebulizer solution 0.5 mg - fluticasone furoate-vilanterol (BREO ELLIPTA) 200-25 MCG/INH AEPB; Inhale 1 puff into the lungs daily.  Dispense: 60 each; Refill: 1 - benzonatate (TESSALON) 100 MG capsule; Take 1-2 capsules (100-200 mg total) by mouth 3 (three) times daily as needed for cough. (Patient not taking: Reported on 10/30/2017)  Dispense: 40 capsule; Refill: 0 - albuterol (PROVENTIL HFA;VENTOLIN HFA) 108 (90 Base) MCG/ACT inhaler; Inhale 2 puffs into the lungs every 4 (four) hours as needed for wheezing or shortness of breath (cough, shortness of breath or wheezing.).  Dispense: 1 Inhaler; Refill: 1 - azelastine  (ASTELIN) 0.1 % nasal spray; Place 2 sprays into both nostrils 2 (two) times daily. Use in each nostril as directed  Dispense: 30 mL; Refill: 0  2. Type 2 diabetes mellitus without complication, with long-term current use of insulin (HCC) POC glucose 240. She did just eat. She is asx.  Not checking sugars frequently.  She would like to try freestyle libre monitor.  - Continuous Blood Gluc Receiver (FREESTYLE LIBRE 14 DAY READER) DEVI; 1 Device by Does not apply route daily as needed.  Dispense: 1 Device; Refill: 0 - Continuous Blood Gluc Sensor (FREESTYLE LIBRE 14 DAY SENSOR) MISC; 1 Units by Does not apply route every 14 (fourteen) days.  Dispense: 2 each; Refill: [place], PA-C  [facility] 10/28/2017 2:34 PM

## 2017-10-28 NOTE — Telephone Encounter (Signed)
Called in c/o wheezing, coughing and chest feeling tight.   She was sick early part of May with a respiratory infection.   She saw [place] Wiseman who referred her to a pulmonologist.   She saw the pulmonologist.     She cleared up from the prior URI but last week she began coughing again.    Last night  The wheezing was the worst it had been   Her chest is also feeling tight this morning.   Due to her having sarcoidosis she felt it best to be seen again.  I scheduled her with Benjiman CoreBrittany Wiseman, PA-C for today at 1:40.   I instructed her to go to the ED if she became short of breath, the chest tightness becomes worse or the wheezing becomes worse.   She verbalized understanding of these instructions.    Reason for Disposition . [1] MILD difficulty breathing (e.g., minimal/no SOB at rest, SOB with walking, pulse <100) AND [2] NEW-onset or WORSE than normal  Answer Assessment - Initial Assessment Questions 1. RESPIRATORY STATUS: "Describe your breathing?" (e.g., wheezing, shortness of breath, unable to speak, severe coughing)      I'm wheezing this morning.  Wheezing is worse at night.   My chest is feeling tight and I'm having trouble breathing.    I've not used a rescue inhaler. 2. ONSET: "When did this breathing problem begin?"      I cleared up after I saw [place] Wiseman.  Last week I started with a constant cough.   I've been using cough medication which did not help.    With my history I thought I better call. 3. PATTERN "Does the difficult breathing come and go, or has it been constant since it started?"      The wheezing got bad enough last night my husband said it was loud. 4. SEVERITY: "How bad is your breathing?" (e.g., mild, moderate, severe)    - MILD: No SOB at rest, mild SOB with walking, speaks normally in sentences, can lay down, no retractions, pulse < 100.    - MODERATE: SOB at rest, SOB with minimal exertion and prefers to sit, cannot lie down flat, speaks in phrases, mild  retractions, audible wheezing, pulse 100-120.    - SEVERE: Very SOB at rest, speaks in single words, struggling to breathe, sitting hunched forward, retractions, pulse > 120      I'm concerned about the wheezing and chest tightness. 5. RECURRENT SYMPTOM: "Have you had difficulty breathing before?" If so, ask: "When was the last time?" and "What happened that time?"      One other time.   [place] gave me medication and referred me to a pulmonary doctor.   They didn't really find anything. 6. CARDIAC HISTORY: "Do you have any history of heart disease?" (e.g., heart attack, angina, bypass surgery, angioplasty)      No 7. LUNG HISTORY: "Do you have any history of lung disease?"  (e.g., pulmonary embolus, asthma, emphysema)     Sarcodosis.    8. CAUSE: "What do you think is causing the breathing problem?"      I'm not getting better from being sick. 9. OTHER SYMPTOMS: "Do you have any other symptoms? (e.g., dizziness, runny nose, cough, chest pain, fever)     Runny nose.   No fever.   Coughing. 10. PREGNANCY: "Is there any chance you are pregnant?" "When was your last menstrual period?"       Not asked 11. TRAVEL: "Have you traveled out  of the country in the last month?" (e.g., travel history, exposures)       I've been exposed to people in the family coughing.   No travel.  Protocols used: BREATHING DIFFICULTY-A-AH

## 2017-10-28 NOTE — Patient Instructions (Addendum)
- We will treat this as a respiratory viral infection.  - I recommend you rest, drink plenty of fluids, eat light meals including soups.  - You may use tessalon perles during the day to stop the cough. Use albuterol inhaler every 4-6 hours for wheezing. Use breo ellipta daily. Contact pulmonology for follow up. - - Use astelin nasal spray for congestion.  - Tea recipe for cought: boil water, add 2 inches shaved ginger root, steep 15 minutes, add juice from 2 full lemons, and 2 tbsp honey. -Return to clinic in 2 days.    Acute Bronchitis, Adult Acute bronchitis is when air tubes (bronchi) in the lungs suddenly get swollen. The condition can make it hard to breathe. It can also cause these symptoms:  A cough.  Coughing up clear, yellow, or green mucus.  Wheezing.  Chest congestion.  Shortness of breath.  A fever.  Body aches.  Chills.  A sore throat.  Follow these instructions at home: Medicines  Take over-the-counter and prescription medicines only as told by your doctor.  If you were prescribed an antibiotic medicine, take it as told by your doctor. Do not stop taking the antibiotic even if you start to feel better. General instructions  Rest.  Drink enough fluids to keep your pee (urine) clear or pale yellow.  Avoid smoking and secondhand smoke. If you smoke and you need help quitting, ask your doctor. Quitting will help your lungs heal faster.  Use an inhaler, cool mist vaporizer, or humidifier as told by your doctor.  Keep all follow-up visits as told by your doctor. This is important. How is this prevented? To lower your risk of getting this condition again:  Wash your hands often with soap and water. If you cannot use soap and water, use hand sanitizer.  Avoid contact with people who have cold symptoms.  Try not to touch your hands to your mouth, nose, or eyes.  Make sure to get the flu shot every year.  Contact a doctor if:  Your symptoms do not get  better in 2 weeks. Get help right away if:  You cough up blood.  You have chest pain.  You have very bad shortness of breath.  You become dehydrated.  You faint (pass out) or keep feeling like you are going to pass out.  You keep throwing up (vomiting).  You have a very bad headache.  Your fever or chills gets worse. This information is not intended to replace advice given to you by your health care provider. Make sure you discuss any questions you have with your health care provider. Document Released: 09/12/2007 Document Revised: 11/02/2015 Document Reviewed: 09/14/2015 Elsevier Interactive Patient Education  2018 [employer]      IF you received an x-ray today, you will receive an invoice from [facility]. Please contact [facility] at [phone] with questions or concerns regarding your invoice.   IF you received labwork today, you will receive an invoice from [facility]. Please contact [facility] at [phone] with questions or concerns regarding your invoice.   Our billing staff will not be able to assist you with questions regarding bills from these companies.  You will be contacted with the lab results as soon as they are available. The fastest way to get your results is to activate your My Chart account. Instructions are located on the last page of this paperwork. If you have not heard from us regarding the results in 2 weeks, please contact this office.

## 2017-10-29 NOTE — Progress Notes (Signed)
Jeanette Ellis  MRN: [id number] DOB: 12-02-1950  Subjective:  Jeanette Ellis is a 67 y.o. female seen in office today for a chief complaint of f/u on illness. Last seen in office 2 days ago. Was having productive cough and wheezing. Given 2 breathing tx in office, which resolved sx.WBC wnl.  Restarted pt back on breo ellipta and given new Rx for albuterol inhaler. Withheld prednisone due to elevated blood sugar and the fact that wheezing resolved with breathing tx.Today, reports she is 75% better. Cough has significantly improved. Wheezing has resolved. Using breo ellipta daily and albuterol prn.  Denies fever, sinus pain, ear pain, shortness of breath and chest pain, nausea, vomiting, abdominal pain and diarrhea.  Review of Systems  Per HPI  Patient Active Problem List   Diagnosis Date Noted  . Microalbuminuria due to type 2 diabetes mellitus (HCC) 09/19/2017  . Sarcoidosis 09/06/2017  . Diabetic retinopathy (HCC) 08/21/2017  . Iron deficiency anemia 03/11/2017  . Anemia associated with stage 3 chronic renal failure (HCC) 03/11/2017  . History of sarcoidosis 06/09/2015  . Type 2 diabetes mellitus without complication, with long-term current use of insulin (HCC) 01/12/2014  . Essential hypertension 09/21/2013  . Hyperlipemia 09/21/2013    Current Outpatient Medications on File Prior to Visit  Medication Sig Dispense Refill  . albuterol (PROVENTIL HFA;VENTOLIN HFA) 108 (90 Base) MCG/ACT inhaler Inhale 2 puffs into the lungs every 4 (four) hours as needed for wheezing or shortness of breath (cough, shortness of breath or wheezing.). 1 Inhaler 1  . aspirin EC 81 MG tablet Take 81 mg by mouth.    Marland Kitchen. atorvastatin (LIPITOR) 20 MG tablet Take 1 tablet (20 mg total) by mouth daily. 90 tablet 1  . azelastine (ASTELIN) 0.1 % nasal spray Place 2 sprays into both nostrils 2 (two) times daily. Use in each nostril as directed 30 mL 0  . Continuous Blood Gluc Receiver (FREESTYLE LIBRE 14 DAY  READER) DEVI 1 Device by Does not apply route daily as needed. 1 Device 0  . Continuous Blood Gluc Sensor (FREESTYLE LIBRE 14 DAY SENSOR) MISC 1 Units by Does not apply route every 14 (fourteen) days. 2 each 2  . fluticasone furoate-vilanterol (BREO ELLIPTA) 200-25 MCG/INH AEPB Inhale 1 puff into the lungs daily. 60 each 1  . Insulin Glargine (TOUJEO SOLOSTAR) 300 UNIT/ML SOPN Inject 25 Units into the skin daily. 4.5 mL 1  . lisinopril (PRINIVIL,ZESTRIL) 10 MG tablet Take 1 tablet (10 mg total) by mouth daily. 90 tablet 1  . metFORMIN (GLUCOPHAGE-XR) 500 MG 24 hr tablet TAKE 2 TABLETS BY MOUTH TWICE DAILY WITH MEALS 180 tablet 0  . Multiple Vitamin (MULTIVITAMIN WITH MINERALS) TABS tablet Take 1 tablet by mouth daily.    . benzonatate (TESSALON) 100 MG capsule Take 1-2 capsules (100-200 mg total) by mouth 3 (three) times daily as needed for cough. (Patient not taking: Reported on 10/30/2017) 40 capsule 0   No current facility-administered medications on file prior to visit.     No Known Allergies   Objective:  BP 117/73 (BP Location: Left Arm, Patient Position: Sitting, Cuff Size: Large)   Pulse 90   Temp 98.8 F (37.1 C) (Oral)   Resp 20   Ht 5' 7.56" (1.716 m)   Wt 200 lb 9.6 oz (91 kg)   SpO2 97%   BMI 30.90 kg/m   Physical Exam  Constitutional: She is oriented to person, place, and time. She appears well-developed and well-nourished. No distress.  HENT:  Head: Normocephalic and atraumatic.  Right Ear: Tympanic membrane, external ear and ear canal normal.  Left Ear: Tympanic membrane, external ear and ear canal normal.  Mouth/Throat: Uvula is midline and mucous membranes are normal. No posterior oropharyngeal edema, posterior oropharyngeal erythema or tonsillar abscesses. No tonsillar exudate.  Eyes: Conjunctivae are normal.  Neck: Normal range of motion.  Cardiovascular: Normal rate, regular rhythm, normal heart sounds and intact distal pulses.  Pulmonary/Chest: Effort normal  and breath sounds normal. She has no decreased breath sounds. She has no wheezes. She has no rhonchi. She has no rales.  Neurological: She is alert and oriented to person, place, and time.  Skin: Skin is warm and dry.  Psychiatric: She has a normal mood and affect.  Vitals reviewed.    Wt Readings from Last 3 Encounters:  10/30/17 200 lb 9.6 oz (91 kg)  10/28/17 202 lb (91.6 kg)  09/19/17 203 lb 12.8 oz (92.4 kg)     Assessment and Plan :  1. Bronchitis with bronchospasm Improving appropriately. Lungs CTAB. Continue with inhalers as prescribed. Follow up with pulm as planned. Follow up here as needed.    Benjiman Core PA-C  Primary Care at [facility] 10/30/2017 4:10 PM

## 2017-10-30 ENCOUNTER — Ambulatory Visit (INDEPENDENT_AMBULATORY_CARE_PROVIDER_SITE_OTHER): Payer: PPO | Admitting: Physician Assistant

## 2017-10-30 ENCOUNTER — Encounter: Payer: Self-pay | Admitting: Physician Assistant

## 2017-10-30 ENCOUNTER — Other Ambulatory Visit: Payer: Self-pay

## 2017-10-30 VITALS — BP 117/73 | HR 90 | Temp 98.8°F | Resp 20 | Ht 67.56 in | Wt 200.6 lb

## 2017-10-30 DIAGNOSIS — J209 Acute bronchitis, unspecified: Secondary | ICD-10-CM | POA: Diagnosis not present

## 2017-10-30 MED ORDER — BLOOD GLUCOSE MONITOR KIT: PACK | 0 refills | Status: DC

## 2017-10-30 NOTE — Patient Instructions (Addendum)
I am glad you are doing better. Continue with breo ellipta daily. Continue using albuterol inhaler every 4 hours as needed. Follow up with me as needed.  Acute Bronchitis, Adult Acute bronchitis is when air tubes (bronchi) in the lungs suddenly get swollen. The condition can make it hard to breathe. It can also cause these symptoms:  A cough.  Coughing up clear, yellow, or green mucus.  Wheezing.  Chest congestion.  Shortness of breath.  A fever.  Body aches.  Chills.  A sore throat.  Follow these instructions at home: Medicines  Take over-the-counter and prescription medicines only as told by your doctor.  If you were prescribed an antibiotic medicine, take it as told by your doctor. Do not stop taking the antibiotic even if you start to feel better. General instructions  Rest.  Drink enough fluids to keep your pee (urine) clear or pale yellow.  Avoid smoking and secondhand smoke. If you smoke and you need help quitting, ask your doctor. Quitting will help your lungs heal faster.  Use an inhaler, cool mist vaporizer, or humidifier as told by your doctor.  Keep all follow-up visits as told by your doctor. This is important. How is this prevented? To lower your risk of getting this condition again:  Wash your hands often with soap and water. If you cannot use soap and water, use hand sanitizer.  Avoid contact with people who have cold symptoms.  Try not to touch your hands to your mouth, nose, or eyes.  Make sure to get the flu shot every year.  Contact a doctor if:  Your symptoms do not get better in 2 weeks. Get help right away if:  You cough up blood.  You have chest pain.  You have very bad shortness of breath.  You become dehydrated.  You faint (pass out) or keep feeling like you are going to pass out.  You keep throwing up (vomiting).  You have a very bad headache.  Your fever or chills gets worse. This information is not intended to replace  advice given to you by your health care provider. Make sure you discuss any questions you have with your health care provider. Document Released: 09/12/2007 Document Revised: 11/02/2015 Document Reviewed: 09/14/2015 Elsevier Interactive Patient Education  2018 [employer]   IF you received an x-ray today, you will receive an invoice from [facility]. Please contact [facility] at [phone] with questions or concerns regarding your invoice.   IF you received labwork today, you will receive an invoice from [facility]. Please contact [facility] at [phone] with questions or concerns regarding your invoice.   Our billing staff will not be able to assist you with questions regarding bills from these companies.  You will be contacted with the lab results as soon as they are available. The fastest way to get your results is to activate your My Chart account. Instructions are located on the last page of this paperwork. If you have not heard from us regarding the results in 2 weeks, please contact this office.

## 2017-10-31 ENCOUNTER — Encounter: Payer: Self-pay | Admitting: Physician Assistant

## 2017-11-11 ENCOUNTER — Other Ambulatory Visit: Payer: PPO

## 2017-11-13 ENCOUNTER — Ambulatory Visit: Payer: PPO | Admitting: Endocrinology

## 2017-11-14 ENCOUNTER — Other Ambulatory Visit: Payer: Self-pay | Admitting: Physician Assistant

## 2017-11-14 DIAGNOSIS — E119 Type 2 diabetes mellitus without complications: Secondary | ICD-10-CM

## 2017-11-14 DIAGNOSIS — Z794 Long term (current) use of insulin: Principal | ICD-10-CM

## 2017-11-14 NOTE — Telephone Encounter (Signed)
Toujeo refill Last Refill:08/07/17 # 4.5 ml 1 Rf Last OV: 08/07/17 PCP: Benjiman CoreBrittany Wiseman PA-C Pharmacy:[facility] [phone]

## 2017-11-22 ENCOUNTER — Ambulatory Visit: Payer: PPO | Admitting: Dietician

## 2017-12-05 ENCOUNTER — Ambulatory Visit: Payer: PPO | Admitting: Pulmonary Disease

## 2017-12-16 ENCOUNTER — Other Ambulatory Visit: Payer: Self-pay | Admitting: Physician Assistant

## 2017-12-16 DIAGNOSIS — E119 Type 2 diabetes mellitus without complications: Secondary | ICD-10-CM

## 2017-12-16 DIAGNOSIS — Z794 Long term (current) use of insulin: Principal | ICD-10-CM

## 2017-12-18 ENCOUNTER — Telehealth: Payer: Self-pay | Admitting: Physician Assistant

## 2017-12-18 DIAGNOSIS — Z794 Long term (current) use of insulin: Principal | ICD-10-CM

## 2017-12-18 DIAGNOSIS — E119 Type 2 diabetes mellitus without complications: Secondary | ICD-10-CM

## 2017-12-26 ENCOUNTER — Ambulatory Visit: Payer: PPO | Admitting: Pulmonary Disease

## 2018-01-03 ENCOUNTER — Other Ambulatory Visit: Payer: Self-pay | Admitting: *Deleted

## 2018-01-03 DIAGNOSIS — E119 Type 2 diabetes mellitus without complications: Secondary | ICD-10-CM

## 2018-01-03 DIAGNOSIS — Z794 Long term (current) use of insulin: Principal | ICD-10-CM

## 2018-01-03 MED ORDER — METFORMIN HCL ER 500 MG PO TB24
1000.0000 mg | ORAL_TABLET | Freq: Two times a day (BID) | ORAL | 0 refills | Status: DC
Start: 2018-01-03 — End: 2018-01-13

## 2018-01-03 NOTE — Telephone Encounter (Signed)
Pt called in and made an appt for Jeanette Ellis next able appt 10/11th.  She is requesting a small  refill of her metformin in order to make it to her appt on the 10/11    Pharmacy - [facility] [address] [place] - [address] [phone] (Phone)

## 2018-01-03 NOTE — Telephone Encounter (Signed)
Medication sent.

## 2018-01-06 ENCOUNTER — Ambulatory Visit: Payer: PPO | Admitting: Dietician

## 2018-01-06 ENCOUNTER — Ambulatory Visit: Payer: PPO | Admitting: Endocrinology

## 2018-01-13 ENCOUNTER — Ambulatory Visit (INDEPENDENT_AMBULATORY_CARE_PROVIDER_SITE_OTHER): Payer: PPO | Admitting: Physician Assistant

## 2018-01-13 ENCOUNTER — Encounter: Payer: Self-pay | Admitting: Physician Assistant

## 2018-01-13 ENCOUNTER — Other Ambulatory Visit: Payer: Self-pay

## 2018-01-13 VITALS — BP 137/72 | HR 79 | Temp 98.2°F | Resp 18 | Ht 67.91 in | Wt 202.4 lb

## 2018-01-13 DIAGNOSIS — Z9119 Patient's noncompliance with other medical treatment and regimen: Secondary | ICD-10-CM | POA: Diagnosis not present

## 2018-01-13 DIAGNOSIS — E119 Type 2 diabetes mellitus without complications: Secondary | ICD-10-CM

## 2018-01-13 DIAGNOSIS — Z23 Encounter for immunization: Secondary | ICD-10-CM | POA: Diagnosis not present

## 2018-01-13 DIAGNOSIS — D869 Sarcoidosis, unspecified: Secondary | ICD-10-CM

## 2018-01-13 DIAGNOSIS — D649 Anemia, unspecified: Secondary | ICD-10-CM | POA: Diagnosis not present

## 2018-01-13 DIAGNOSIS — Z91199 Patient's noncompliance with other medical treatment and regimen due to unspecified reason: Secondary | ICD-10-CM

## 2018-01-13 DIAGNOSIS — Z794 Long term (current) use of insulin: Secondary | ICD-10-CM

## 2018-01-13 LAB — POCT GLYCOSYLATED HEMOGLOBIN (HGB A1C): Hemoglobin A1C: 8.5 % — AB (ref 4.0–5.6)

## 2018-01-13 MED ORDER — DULAGLUTIDE 0.75 MG/0.5ML ~~LOC~~ SOAJ: 0.7500 mg | SUBCUTANEOUS | 0 refills | Status: DC

## 2018-01-13 MED ORDER — FLUTICASONE FUROATE-VILANTEROL 200-25 MCG/INH IN AEPB: 1.0000 | INHALATION_SPRAY | Freq: Every day | RESPIRATORY_TRACT | 1 refills | Status: DC

## 2018-01-13 MED ORDER — DULAGLUTIDE 0.75 MG/0.5ML ~~LOC~~ SOAJ: 0.7500 mg | SUBCUTANEOUS | 2 refills | Status: DC

## 2018-01-13 MED ORDER — METFORMIN HCL ER 500 MG PO TB24: 1000.0000 mg | ORAL_TABLET | Freq: Two times a day (BID) | ORAL | 0 refills | Status: DC

## 2018-01-13 NOTE — Patient Instructions (Addendum)
For diabetes: Continue metformin and insulin as you are. Start trulicity weekly.    Check blood sugars on waking up 3/7 days   Also check blood sugars about 2 hours after a meal and do this after different meals by rotation  Recommended blood sugar levels on waking up is 90-130 and about 2 hours after meal is 130-160  Please bring your blood sugar monitor to Dr. Remus Blake office.     For anemia: Dr. Jim Desanctis on 02/05/2018 at 1pm   If you have lab work done today you will be contacted with your lab results within the next 2 weeks.  If you have not heard from [place] then please contact us. The fastest way to get your results is to register for My Chart.   IF you received an x-ray today, you will receive an invoice from [facility]. Please contact [facility] at [phone] with questions or concerns regarding your invoice.   IF you received labwork today, you will receive an invoice from [facility]. Please contact [facility] at [phone] with questions or concerns regarding your invoice.   Our billing staff will not be able to assist you with questions regarding bills from these companies.  You will be contacted with the lab results as soon as they are available. The fastest way to get your results is to activate your My Chart account. Instructions are located on the last page of this paperwork. If you have not heard from [place] regarding the results in 2 weeks, please contact this office.

## 2018-01-13 NOTE — Progress Notes (Signed)
MRN: [id number]  Subjective:   Jeanette Ellis is a 67 y.o. female who presents for medication refill for metformin. She is followed by endocrinology, Dr. Dwyane Dee, but had to cancel her last appointment and did not reschedule it. She is now out of metformin.Has continued toujeo 25 units every night. Just got a freestyle libre ~2 weeks ago and is checking blood sugars frequently. Range is 70-280. Mostly high after she eats "things I should not be eating."  Denies adverse effects including metallic taste, hypoglycemia, nausea, vomiting.  Patient denies nausea, paresthesia of the feet, polydipsia, polyuria, visual disturbances, vomiting and weight loss. Patient is checking their feet daily. No foot concerns. Last diabetic eye exam eye exam 03/28/2017.   Diet: trying to eat more salads and notes her sugars are better when she does this. Still eats rice and "junk food." Walks sometimes.  Went to diabetes nutrition education class but canceled her follow-up appointment.  Known diabetic complications: retinopathy  Immunizations: Flu vaccine: 2018, pneumococal vaccine:2017  Has had success with both Invokana and Trulicity in the past but when she reaches her donut hole each year for medication coverage she typically has to stop them because she cannot afford them.    Other concerns: Has not followed up with hematology recently. Last seen them in 03/2017. Was supposed to follow up to discuss tx options for anemia. She has tried to call a few times with no success.  Has f/u with pulm at the end of this month. She has been out of breo inhaler for about one month. Notes she was feeling much better on it. Does endorse some productive cough with clear phlegm production and occasional episodes of dyspnea with wheezing.  Denies any fevers, chills, and chest pain. Using albuterol inhaler prn.  ROS Per HPI Patient Active Problem List   Diagnosis Date Noted  . Microalbuminuria due to type 2 diabetes mellitus  ([place]) 09/19/2017  . Sarcoidosis 09/06/2017  . Diabetic retinopathy ([place]) 08/21/2017  . Iron deficiency anemia 03/11/2017  . Anemia associated with stage 3 chronic renal failure ([place]) 03/11/2017  . History of sarcoidosis 06/09/2015  . Type 2 diabetes mellitus without complication, with long-term current use of insulin ([place]) 01/12/2014  . Essential hypertension 09/21/2013  . Hyperlipemia 09/21/2013   Social History   Socioeconomic History  . Marital status: Married    Spouse name: Zynzulu  . Number of children: 4  . Years of education: Not on file  . Highest education level: Not on file  Occupational History  . Not on file  Social Needs  . Financial resource strain: Not on file  . Food insecurity:    Worry: Not on file    Inability: Not on file  . Transportation needs:    Medical: Not on file    Non-medical: Not on file  Tobacco Use  . Smoking status: Never Smoker  . Smokeless tobacco: Never Used  Substance and Sexual Activity  . Alcohol use: No  . Drug use: No  . Sexual activity: Not Currently  Lifestyle  . Physical activity:    Days per week: Not on file    Minutes per session: Not on file  . Stress: Not on file  Relationships  . Social connections:    Talks on phone: Not on file    Gets together: Not on file    Attends religious service: Not on file    Active member of club or organization: Not on file    Attends  meetings of clubs or organizations: Not on file    Relationship status: Not on file  . Intimate partner violence:    Fear of current or ex partner: Not on file    Emotionally abused: Not on file    Physically abused: Not on file    Forced sexual activity: Not on file  Other Topics Concern  . Not on file  Social History Narrative  . Not on file      Objective:   PHYSICAL EXAM BP 137/72   Pulse 79   Temp 98.2 F (36.8 C) (Oral)   Resp 18   Ht 5' 7.91" (1.725 m)   Wt 202 lb 6.4 oz (91.8 kg)   SpO2 97%   BMI 30.85 kg/m   Physical Exam    Constitutional: She is oriented to person, place, and time. She appears well-developed and well-nourished. No distress.  HENT:  Head: Normocephalic and atraumatic.  Mouth/Throat: Uvula is midline, oropharynx is clear and moist and mucous membranes are normal.  Eyes: Pupils are equal, round, and reactive to light. Conjunctivae and EOM are normal.  Neck: Normal range of motion.  Cardiovascular: Normal rate, regular rhythm, normal heart sounds and intact distal pulses.  Pulmonary/Chest: Effort normal and breath sounds normal. No respiratory distress. She has no wheezes. She has no rhonchi. She has no rales.  Musculoskeletal:       Right lower leg: She exhibits no swelling.       Left lower leg: She exhibits no swelling.  Neurological: She is alert and oriented to person, place, and time.  Skin: Skin is warm and dry.  Psychiatric: She has a normal mood and affect.  Vitals reviewed.    Results for orders placed or performed in visit on 01/13/18 (from the past 24 hour(s))  POCT glycosylated hemoglobin (Hb A1C)     Status: Abnormal   Collection Time: 01/13/18  1:54 PM  Result Value Ref Range   Hemoglobin A1C 8.5 (A) 4.0 - 5.6 %   HbA1c POC (<> result, manual entry)     HbA1c, POC (prediabetic range)     HbA1c, POC (controlled diabetic range)      Assessment and Plan :  1. Type 2 diabetes mellitus without complication, with long-term current use of insulin (HCC) Uncontrolled.  A1c has increased from 7.6 in June 2019 to 8.5 today.  Patient has history of not following up with specialists at appropriate time frames, which makes helping manage her diabetes difficult.  Discussed this with patient.  She understands that she needs to follow-up as planned.  Recommend continuing with metformin and Toujeo at this time.  We will add Trulicity as it appears that her insurance covers it at this time and she has responded to and tolerated this medication very well in the past.  Continue checking fasting  blood sugar and blood sugar after 2 largest meals a day.  Follow-up with diabetic nutritionist as planned.  Personally had CMA contact Dr. Ronnie Derby office and schedule patient a follow-up appointment.  This is scheduled for 03/19/18.  Follow-up with me in 3 months. - POCT glycosylated hemoglobin (Hb A1C) - CMP14+EGFR - Lipid panel - Microalbumin/Creatinine Ratio, Urine - Urinalysis, dipstick only - metFORMIN (GLUCOPHAGE-XR) 500 MG 24 hr tablet; Take 2 tablets (1,000 mg total) by mouth 2 (two) times daily with a meal.  Dispense: 120 tablet; Refill: 0 - Dulaglutide (TRULICITY) 1.28 NO/6.7EH SOPN; Inject 0.75 mg into the skin once a week.  Dispense: 4 pen; Refill: 2  2. Sarcoidosis Patient has appointment with pulmonology scheduled for 02/06/2018.  Strongly encourage patient to attend appointment.  Refill for Breo provided. - fluticasone furoate-vilanterol (BREO ELLIPTA) 200-25 MCG/INH AEPB; Inhale 1 puff into the lungs daily.  Dispense: 60 each; Refill: 1  3. Anemia, unspecified type CMA contacted Dr. Antonieta Pert office and scheduled pt an appointment for 02/05/18.   4. Medical non-compliance Had lengthy discussion with patient about potential complications of medical noncompliance. She voices her understanding and is motivated to start following up appropriately.   5. Need for immunization against influenza - Flu vaccine HIGH DOSE PF    Tenna Delaine, PA-C  Primary Care at [facility] 01/13/2018 2:52 PM

## 2018-01-14 LAB — CMP14+EGFR
ALT: 18 IU/L (ref 0–32)
AST: 28 IU/L (ref 0–40)
Albumin/Globulin Ratio: 1.4 (ref 1.2–2.2)
Albumin: 4.3 g/dL (ref 3.6–4.8)
Alkaline Phosphatase: 68 IU/L (ref 39–117)
BUN/Creatinine Ratio: 12 (ref 12–28)
BUN: 11 mg/dL (ref 8–27)
Bilirubin Total: 0.6 mg/dL (ref 0.0–1.2)
CO2: 25 mmol/L (ref 20–29)
Calcium: 9.4 mg/dL (ref 8.7–10.3)
Chloride: 98 mmol/L (ref 96–106)
Creatinine, Ser: 0.95 mg/dL (ref 0.57–1.00)
GFR calc Af Amer: 71 mL/min/{1.73_m2} (ref >59)
GFR calc non Af Amer: 61 mL/min/{1.73_m2} (ref >59)
Globulin, Total: 3.1 g/dL (ref 1.5–4.5)
Glucose: 161 mg/dL — ABNORMAL HIGH (ref 65–99)
Potassium: 4.4 mmol/L (ref 3.5–5.2)
Sodium: 139 mmol/L (ref 134–144)
Total Protein: 7.4 g/dL (ref 6.0–8.5)

## 2018-01-14 LAB — LIPID PANEL
Chol/HDL Ratio: 2.4 ratio (ref 0.0–4.4)
Cholesterol, Total: 143 mg/dL (ref 100–199)
HDL: 60 mg/dL (ref >39)
LDL Calculated: 71 mg/dL (ref 0–99)
Triglycerides: 59 mg/dL (ref 0–149)
VLDL Cholesterol Cal: 12 mg/dL (ref 5–40)

## 2018-01-14 LAB — URINALYSIS, DIPSTICK ONLY
Bilirubin, UA: NEGATIVE
Glucose, UA: NEGATIVE
Ketones, UA: NEGATIVE
Nitrite, UA: NEGATIVE
Protein, UA: NEGATIVE
RBC, UA: NEGATIVE
Specific Gravity, UA: 1.01 (ref 1.005–1.030)
Urobilinogen, Ur: 0.2 mg/dL (ref 0.2–1.0)
pH, UA: 5.5 (ref 5.0–7.5)

## 2018-01-14 LAB — MICROALBUMIN / CREATININE URINE RATIO
Creatinine, Urine: 67.7 mg/dL
Microalb/Creat Ratio: 6.6 mg/g creat (ref 0.0–30.0)
Microalbumin, Urine: 4.5 ug/mL

## 2018-01-16 ENCOUNTER — Encounter: Payer: Self-pay | Admitting: *Deleted

## 2018-01-17 ENCOUNTER — Ambulatory Visit: Payer: PPO | Admitting: Physician Assistant

## 2018-02-01 DIAGNOSIS — Z1231 Encounter for screening mammogram for malignant neoplasm of breast: Secondary | ICD-10-CM | POA: Diagnosis not present

## 2018-02-01 LAB — HM MAMMOGRAPHY

## 2018-02-03 ENCOUNTER — Encounter: Payer: Self-pay | Admitting: Dietician

## 2018-02-03 ENCOUNTER — Encounter: Payer: Self-pay | Admitting: Gastroenterology

## 2018-02-03 ENCOUNTER — Encounter: Payer: PPO | Attending: Endocrinology | Admitting: Dietician

## 2018-02-03 DIAGNOSIS — Z713 Dietary counseling and surveillance: Secondary | ICD-10-CM | POA: Diagnosis not present

## 2018-02-03 DIAGNOSIS — Z6830 Body mass index (BMI) 30.0-30.9, adult: Secondary | ICD-10-CM | POA: Diagnosis not present

## 2018-02-03 DIAGNOSIS — E1165 Type 2 diabetes mellitus with hyperglycemia: Secondary | ICD-10-CM | POA: Diagnosis present

## 2018-02-03 DIAGNOSIS — Z794 Long term (current) use of insulin: Secondary | ICD-10-CM | POA: Insufficient documentation

## 2018-02-03 DIAGNOSIS — E119 Type 2 diabetes mellitus without complications: Secondary | ICD-10-CM

## 2018-02-03 NOTE — Patient Instructions (Addendum)
Drink beverages without carbohydrates (or very low) Decrease your fat intake (butter, oil, mayo) Choose baked rather than fried more often. Choose very lean meat  Be sure to swipe your [place] at least every 8 hours.  Recommend checking before breakfast  Before and 2 hours after each meal  Before Bed  Compare your blood sugar before and after.  Ideally it should go up 40-60 points and not more.  High fat high carbohydrate meals will make your blood sugar high and stay high for a longer period of time.  Be sure to treat a low blood sugar.  2 Tablespoons of raisins or 1/2 cup juice or 3-4 glucose tabs  Consider an appointment with Dr. Lucianne Muss

## 2018-02-04 NOTE — Progress Notes (Signed)
Diabetes Self-Management Education  Visit Type:  Follow-up  Appt. Start Time: 1430 Appt. End Time: 1515  02/04/2018  [name], identified by name and date of birth, is a 67 y.o. female with a diagnosis of Diabetes:  Type 2.  Other history includes HLD, HTN, and sarcoidosis.  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  She lives with her husband.  She was last seen by me one year ago. She has the [employer] now but their are a lot of gaps in the readings.  Discussed that she needs to swipe the sensor every 8 hours at least and encouraged her to do this fasting in the am, after breakfast, before and 2 hours after lunch and dinner as well as bed time.  By doing this she can better understand the effect of food on her blood sugar.  She also can see more of the patterns of low blood sugar readings.  She has had lows occasionally as low as 40 in the am.  When it was 60 she did not treat it.  Reviewed symptoms and treatment of hypoglycemia.  Medications include Toujeo 26 units daily, Metformin XR.  She has been prescribed Trulicity but states that it is too expensive. Weight 202 lb (91.6 kg). Body mass index is 30.79 kg/m.   Diabetes Self-Management Education - 02/03/18 1457      Psychosocial Assessment   Patient Belief/Attitude about Diabetes  Motivated to manage diabetes    Self-care barriers  None    Self-management support  Doctor's office    Patient Concerns  Nutrition/Meal planning;Glycemic Control    Special Needs  None    Preferred Learning Style  No preference indicated    Learning Readiness  Ready      Pre-Education Assessment   Patient understands the diabetes disease and treatment process.  Demonstrates understanding / competency    Patient understands incorporating nutritional management into lifestyle.  Needs Review     Patient undertands incorporating physical activity into lifestyle.  Needs Review    Patient understands using medications safely.  Demonstrates understanding / competency    Patient understands monitoring blood glucose, interpreting and using results  Needs Review    Patient understands prevention, detection, and treatment of acute complications.  Needs Review    Patient understands prevention, detection, and treatment of chronic complications.  Demonstrates understanding / competency    Patient understands how to develop strategies to address psychosocial issues.  Demonstrates understanding / competency    Patient understands how to develop strategies to promote health/change behavior.  Needs Review      Complications   Last HgB A1C per patient/outside source  8.5 %   01/13/18 increased from 7.65 09/19/17   How often do you check your blood sugar?  3-4 times/day   Free Style Libre   Fasting Blood glucose range (mg/dL)  <16;10-960;454-098    Postprandial Blood glucose range (mg/dL)  119-147;>829    Number of hypoglycemic episodes per month  1    Can you tell when your blood sugar is low?  Yes    What do you do if your blood sugar is low?  nothing      Dietary Intake   Breakfast  grits or cereal and fruit and occasional cooffee OR eggs and sausage or skips  Snack (morning)  none    Lunch  salad with chicken, cheese OR tuna sandwich on [employer] bread, occasional fruit    Snack (afternoon)  occasional apple or NABS or PB crackers homemade    Dinner  vegetable, meat or fish, brown rice or occasional sweet potato OR cheese sandwich on [employer]   overeats   Snack (evening)  generally none    Beverage(s)  coffee with regular cream, stevia, water, kombucha at times      Patient Education   Previous Diabetes Education  Yes (please comment)   one year ago   Nutrition management   Role of diet in the treatment of diabetes and the relationship between the three main macronutrients and blood glucose  level;Food label reading, portion sizes and measuring food.;Reviewed blood glucose goals for pre and post meals and how to evaluate the patients' food intake on their blood glucose level.;Meal options for control of blood glucose level and chronic complications.    Physical activity and exercise   Role of exercise on diabetes management, blood pressure control and cardiac health.;Identified with patient nutritional and/or medication changes necessary with exercise.    Medications  Reviewed patients medication for diabetes, action, purpose, timing of dose and side effects.    Monitoring  Other (comment)   Reviewed Libre and blood sugar goals   Acute complications  Taught treatment of hypoglycemia - the 15 rule.    Psychosocial adjustment  Role of stress on diabetes      Individualized Goals (developed by patient)   Nutrition  General guidelines for healthy choices and portions discussed    Physical Activity  Exercise 3-5 times per week;30 minutes per day    Medications  take my medication as prescribed    Monitoring   test blood glucose pre and post meals as discussed    Health Coping  discuss diabetes with (comment)   MD, RD, CDE     Post-Education Assessment   Patient understands the diabetes disease and treatment process.  Demonstrates understanding / competency    Patient understands incorporating nutritional management into lifestyle.  Demonstrates understanding / competency    Patient undertands incorporating physical activity into lifestyle.  Demonstrates understanding / competency    Patient understands using medications safely.  Demonstrates understanding / competency    Patient understands monitoring blood glucose, interpreting and using results  Demonstrates understanding / competency    Patient understands prevention, detection, and treatment of acute complications.  Demonstrates understanding / competency    Patient understands prevention, detection, and treatment of chronic  complications.  Demonstrates understanding / competency    Patient understands how to develop strategies to address psychosocial issues.  Demonstrates understanding / competency    Patient understands how to develop strategies to promote health/change behavior.  Demonstrates understanding / competency      Outcomes   Program Status  Completed      Subsequent Visit   Since your last visit have you continued or begun to take your medications as prescribed?  Yes    Since your last visit have you had your blood pressure checked?  No    Since your last visit have you experienced any weight changes?  No change    Since your last visit, are you checking your blood glucose at least once a day?  Yes       Learning Objective:  Patient will have a greater understanding of diabetes self-management. Patient education plan is to attend individual and/or group sessions per assessed  needs and concerns.   Plan:   Patient Instructions  Drink beverages without carbohydrates (or very low) Decrease your fat intake (butter, oil, mayo) Choose baked rather than fried more often. Choose very lean meat  Be sure to swipe your [place] at least every 8 hours.  Recommend checking before breakfast  Before and 2 hours after each meal  Before Bed  Compare your blood sugar before and after.  Ideally it should go up 40-60 points and not more.  High fat high carbohydrate meals will make your blood sugar high and stay high for a longer period of time.  Be sure to treat a low blood sugar.  2 Tablespoons of raisins or 1/2 cup juice or 3-4 glucose tabs  Consider an appointment with Dr. Lucianne Muss      Expected Outcomes:  Demonstrated interest in learning. Expect positive outcomes  Education material provided: Food label handouts, A1C conversion sheet, Meal plan card, My Plate and Snack sheet, Medicare part D  If problems or questions, patient to contact team via:  Phone  Future DSME appointment: - PRN

## 2018-02-05 ENCOUNTER — Encounter: Payer: Self-pay | Admitting: Hematology & Oncology

## 2018-02-05 ENCOUNTER — Encounter: Payer: Self-pay | Admitting: Physician Assistant

## 2018-02-05 ENCOUNTER — Inpatient Hospital Stay (HOSPITAL_BASED_OUTPATIENT_CLINIC_OR_DEPARTMENT_OTHER): Payer: PPO | Admitting: Hematology & Oncology

## 2018-02-05 ENCOUNTER — Inpatient Hospital Stay: Payer: PPO | Attending: Hematology & Oncology

## 2018-02-05 ENCOUNTER — Other Ambulatory Visit: Payer: Self-pay

## 2018-02-05 VITALS — BP 151/85 | HR 73 | Temp 98.7°F | Resp 18 | Wt 203.8 lb

## 2018-02-05 DIAGNOSIS — D509 Iron deficiency anemia, unspecified: Secondary | ICD-10-CM | POA: Insufficient documentation

## 2018-02-05 DIAGNOSIS — N289 Disorder of kidney and ureter, unspecified: Secondary | ICD-10-CM | POA: Diagnosis not present

## 2018-02-05 DIAGNOSIS — R928 Other abnormal and inconclusive findings on diagnostic imaging of breast: Secondary | ICD-10-CM | POA: Diagnosis not present

## 2018-02-05 DIAGNOSIS — D631 Anemia in chronic kidney disease: Secondary | ICD-10-CM

## 2018-02-05 DIAGNOSIS — D500 Iron deficiency anemia secondary to blood loss (chronic): Secondary | ICD-10-CM

## 2018-02-05 DIAGNOSIS — E119 Type 2 diabetes mellitus without complications: Secondary | ICD-10-CM

## 2018-02-05 DIAGNOSIS — N183 Chronic kidney disease, stage 3 unspecified: Secondary | ICD-10-CM

## 2018-02-05 DIAGNOSIS — N6489 Other specified disorders of breast: Secondary | ICD-10-CM | POA: Diagnosis not present

## 2018-02-05 LAB — CBC WITH DIFFERENTIAL (CANCER CENTER ONLY)
Abs Immature Granulocytes: 0.03 10*3/uL (ref 0.00–0.07)
Basophils Absolute: 0 10*3/uL (ref 0.0–0.1)
Basophils Relative: 0 %
Eosinophils Absolute: 0.3 10*3/uL (ref 0.0–0.5)
Eosinophils Relative: 7 %
HCT: 33.2 % — ABNORMAL LOW (ref 36.0–46.0)
Hemoglobin: 10 g/dL — ABNORMAL LOW (ref 12.0–15.0)
Immature Granulocytes: 1 %
Lymphocytes Relative: 29 %
Lymphs Abs: 1.3 10*3/uL (ref 0.7–4.0)
MCH: 26 pg (ref 26.0–34.0)
MCHC: 30.1 g/dL (ref 30.0–36.0)
MCV: 86.5 fL (ref 80.0–100.0)
Monocytes Absolute: 0.4 10*3/uL (ref 0.1–1.0)
Monocytes Relative: 9 %
Neutro Abs: 2.5 10*3/uL (ref 1.7–7.7)
Neutrophils Relative %: 54 %
Platelet Count: 233 10*3/uL (ref 150–400)
RBC: 3.84 MIL/uL — ABNORMAL LOW (ref 3.87–5.11)
RDW: 12.2 % (ref 11.5–15.5)
WBC Count: 4.6 10*3/uL (ref 4.0–10.5)
nRBC: 0 % (ref 0.0–0.2)

## 2018-02-05 LAB — CMP (CANCER CENTER ONLY)
ALT: 15 U/L (ref 0–44)
AST: 24 U/L (ref 15–41)
Albumin: 3.6 g/dL (ref 3.5–5.0)
Alkaline Phosphatase: 59 U/L (ref 38–126)
Anion gap: 8 (ref 5–15)
BUN: 13 mg/dL (ref 8–23)
CO2: 29 mmol/L (ref 22–32)
Calcium: 9.4 mg/dL (ref 8.9–10.3)
Chloride: 101 mmol/L (ref 98–111)
Creatinine: 0.87 mg/dL (ref 0.44–1.00)
GFR, Est AFR Am: >60 mL/min (ref >60)
GFR, Estimated: >60 mL/min (ref >60)
Glucose, Bld: 154 mg/dL — ABNORMAL HIGH (ref 70–99)
Potassium: 4.2 mmol/L (ref 3.5–5.1)
Sodium: 138 mmol/L (ref 135–145)
Total Bilirubin: 0.6 mg/dL (ref 0.3–1.2)
Total Protein: 7.5 g/dL (ref 6.5–8.1)

## 2018-02-05 LAB — SAVE SMEAR (SSMR)

## 2018-02-06 ENCOUNTER — Ambulatory Visit: Payer: PPO | Admitting: Pulmonary Disease

## 2018-02-06 LAB — PROTEIN ELECTROPHORESIS, SERUM, WITH REFLEX
A/G Ratio: 1 (ref 0.7–1.7)
Albumin ELP: 3.5 g/dL (ref 2.9–4.4)
Alpha-1-Globulin: 0.2 g/dL (ref 0.0–0.4)
Alpha-2-Globulin: 0.8 g/dL (ref 0.4–1.0)
Beta Globulin: 1.1 g/dL (ref 0.7–1.3)
Gamma Globulin: 1.5 g/dL (ref 0.4–1.8)
Globulin, Total: 3.6 g/dL (ref 2.2–3.9)
Total Protein ELP: 7.1 g/dL (ref 6.0–8.5)

## 2018-02-06 LAB — IRON AND TIBC
Iron: 63 ug/dL (ref 41–142)
Saturation Ratios: 19 % — ABNORMAL LOW (ref 21–57)
TIBC: 326 ug/dL (ref 236–444)
UIBC: 263 ug/dL (ref 120–384)

## 2018-02-06 LAB — FERRITIN: Ferritin: 80 ng/mL (ref 11–307)

## 2018-02-06 LAB — HEMOGLOBINOPATHY EVALUATION
Hgb A2 Quant: 2 % (ref 1.8–3.2)
Hgb A: 98 % (ref 96.4–98.8)
Hgb C: 0 %
Hgb F Quant: 0 % (ref 0.0–2.0)
Hgb S Quant: 0 %
Hgb Variant: 0 %

## 2018-02-06 LAB — IGG, IGA, IGM
IgA: 306 mg/dL (ref 87–352)
IgG (Immunoglobin G), Serum: 1661 mg/dL — ABNORMAL HIGH (ref 700–1600)
IgM (Immunoglobulin M), Srm: 73 mg/dL (ref 26–217)

## 2018-02-06 LAB — KAPPA/LAMBDA LIGHT CHAINS
Kappa free light chain: 35.9 mg/L — ABNORMAL HIGH (ref 3.3–19.4)
Kappa, lambda light chain ratio: 2.33 — ABNORMAL HIGH (ref 0.26–1.65)
Lambda free light chains: 15.4 mg/L (ref 5.7–26.3)

## 2018-02-06 LAB — ERYTHROPOIETIN: Erythropoietin: 13.3 m[IU]/mL (ref 2.6–18.5)

## 2018-02-07 ENCOUNTER — Ambulatory Visit: Payer: PPO | Admitting: Pulmonary Disease

## 2018-02-07 ENCOUNTER — Ambulatory Visit: Payer: PPO | Admitting: Nurse Practitioner

## 2018-02-07 NOTE — Progress Notes (Signed)
Hematology and Oncology Follow Up Visit  Jeanette Ellis [id number] 02-13-1951 67 y.o. 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  We first saw her back on December 2018.  I am not sure why she never made it back for follow-up.  She seems to be doing okay.  She has had no problems with bleeding.  She has had no hospitalizations.  When we first saw her, her iron studies showed a ferritin of 133 with an iron saturation of 26%.  Her erythropoietin level is only 13.3.  She has a normal hemoglobin electrophoresis.  She does not feel all that bad.  She has had no fever.  She is eating okay.  Her blood sugars have been under fairly decent control.  She has had no rashes.  There is been no change in bowel or bladder habits.  There is been no cough.  She has had no headache.  There is been no mouth sores.  Overall, her performance status is ECOG 1-2.  Medications:  Current Outpatient Medications:  .  albuterol (PROVENTIL HFA;VENTOLIN HFA) 108 (90 Base) MCG/ACT inhaler, Inhale 2 puffs into the lungs every 4 (four) hours as needed for wheezing or shortness of breath (cough, shortness of breath or wheezing.)., Disp: 1 Inhaler, Rfl: 1 .  aspirin EC 81 MG tablet, Take 81 mg by mouth., Disp: , Rfl:  .  atorvastatin (LIPITOR) 20 MG tablet, Take 1 tablet (20 mg total) by mouth daily., Disp: 90 tablet, Rfl: 1 .  blood glucose meter kit and supplies KIT, Dispense based on patient and insurance preference. Use up to four times daily as directed. (FOR ICD-9 250.00, 250.01)., Disp: 1 each, Rfl: 0 .  Continuous Blood Gluc Receiver (FREESTYLE LIBRE 14 DAY READER) DEVI, 1 Device by Does not apply route daily as needed., Disp: 1 Device, Rfl: 0 .  Continuous Blood  Gluc Sensor (FREESTYLE LIBRE 14 DAY SENSOR) MISC, 1 Units by Does not apply route every 14 (fourteen) days., Disp: 2 each, Rfl: 2 .  fluticasone furoate-vilanterol (BREO ELLIPTA) 200-25 MCG/INH AEPB, Inhale 1 puff into the lungs daily., Disp: 60 each, Rfl: 1 .  lisinopril (PRINIVIL,ZESTRIL) 10 MG tablet, Take 1 tablet (10 mg total) by mouth daily., Disp: 90 tablet, Rfl: 1 .  metFORMIN (GLUCOPHAGE-XR) 500 MG 24 hr tablet, Take 2 tablets (1,000 mg total) by mouth 2 (two) times daily with a meal., Disp: 120 tablet, Rfl: 0 .  Multiple Vitamin (MULTIVITAMIN WITH MINERALS) TABS tablet, Take 1 tablet by mouth daily., Disp: , Rfl:  .  TOUJEO SOLOSTAR 300 UNIT/ML SOPN, INJECT 25 UNITS SUBCUTANEOUSLY ONCE DAILY, Disp: 4.5 mL, Rfl: 1  Allergies: No Known Allergies  Past Medical History, Surgical history, Social history, and Family History were reviewed and updated.  Review of Systems: Review of Systems  Constitutional: Negative.   HENT:  Negative.   Eyes: Negative.   Respiratory: Negative.   Cardiovascular: Negative.   Gastrointestinal: Negative.   Endocrine: Negative.   Genitourinary: Negative.    Musculoskeletal: Negative.   Skin: Negative.   Neurological: Negative.   Hematological: Negative.   Psychiatric/Behavioral: Negative.     Physical Exam:  weight is 203 lb 12.8 oz (92.4 kg). Her oral temperature is 98.7 F (37.1 C).  Her blood pressure is 151/85 (abnormal) and her pulse is 73. Her respiration is 18 and oxygen saturation is 99%.   Wt Readings from Last 3 Encounters:  02/05/18 203 lb 12.8 oz (92.4 kg)  02/03/18 202 lb (91.6 kg)  01/13/18 202 lb 6.4 oz (91.8 kg)    Physical Exam  Constitutional: She is oriented to person, place, and time.  HENT:  Head: Normocephalic and atraumatic.  Mouth/Throat: Oropharynx is clear and moist.  Eyes: Pupils are equal, round, and reactive to light. EOM are normal.  Neck: Normal range of motion.  Cardiovascular: Normal rate, regular rhythm and  normal heart sounds.  Pulmonary/Chest: Effort normal and breath sounds normal.  Abdominal: Soft. Bowel sounds are normal.  Musculoskeletal: Normal range of motion. She exhibits no edema, tenderness or deformity.  Lymphadenopathy:    She has no cervical adenopathy.  Neurological: She is alert and oriented to person, place, and time.  Skin: Skin is warm and dry. No rash noted. No erythema.  Psychiatric: She has a normal mood and affect. Her behavior is normal. Judgment and thought content normal.  Vitals reviewed.    Lab Results  Component Value Date   WBC 4.6 02/05/2018   HGB 10.0 (L) 02/05/2018   HCT 33.2 (L) 02/05/2018   MCV 86.5 02/05/2018   PLT 233 02/05/2018     Chemistry      Component Value Date/Time   NA 138 02/05/2018 1305   NA 139 01/13/2018 1444   NA 141 03/11/2017 1409   K 4.2 02/05/2018 1305   K 4.2 03/11/2017 1409   CL 101 02/05/2018 1305   CL 100 03/11/2017 1409   CO2 29 02/05/2018 1305   CO2 26 03/11/2017 1409   BUN 13 02/05/2018 1305   BUN 11 01/13/2018 1444   BUN 14 03/11/2017 1409   CREATININE 0.87 02/05/2018 1305   CREATININE 0.7 03/11/2017 1409      Component Value Date/Time   CALCIUM 9.4 02/05/2018 1305   CALCIUM 9.4 03/11/2017 1409   ALKPHOS 59 02/05/2018 1305   ALKPHOS 86 (H) 03/11/2017 1409   AST 24 02/05/2018 1305   ALT 15 02/05/2018 1305   ALT 26 03/11/2017 1409   BILITOT 0.6 02/05/2018 1305       Impression and Plan: Jeanette Ellis is a 67 year old African-American female.  She has mild anemia.  She is asymptomatic with this.  I suspect that her lack of erythropoietin is her big problem.  She has some renal insufficiency.  She has diabetes.  Since she is not having symptoms, we will hold off on trying to give her Aranesp.  We will follow her along.  Her iron studies are borderline low.  Her ferritin is 80 with an iron saturation of 19%.  I will get her back in about 6 weeks.  We will see how everything looks.  We might be able to  give her some Aranesp and also some iron if her hemoglobin gets below 10.   Volanda Napoleon, MD 11/1/20192:58 PM

## 2018-02-10 ENCOUNTER — Ambulatory Visit (INDEPENDENT_AMBULATORY_CARE_PROVIDER_SITE_OTHER): Payer: PPO | Admitting: Nurse Practitioner

## 2018-02-10 ENCOUNTER — Encounter: Payer: Self-pay | Admitting: Nurse Practitioner

## 2018-02-10 DIAGNOSIS — D869 Sarcoidosis, unspecified: Secondary | ICD-10-CM | POA: Diagnosis not present

## 2018-02-10 MED ORDER — FLUTICASONE FUROATE-VILANTEROL 200-25 MCG/INH IN AEPB: 1.0000 | INHALATION_SPRAY | Freq: Every day | RESPIRATORY_TRACT | 0 refills | Status: DC

## 2018-02-10 NOTE — Assessment & Plan Note (Signed)
Patient Instructions  Continue Breo daily Continue Proventil as needed Stay active  Follow up with Dr. Isaiah Serge in 3 months or sooner if needed

## 2018-02-10 NOTE — Progress Notes (Signed)
$'@Patient'I$  ID: Jeanette Ellis, female    DOB: 04-01-1951, 67 y.o.   MRN: [id number]  Chief Complaint  Patient presents with  . Follow-up    Referring provider: Leonie Douglas, PA*  HPI 67 year old female with history of sarcoidosis. She is followed by Dr. Vaughan Browner.   Tests: Chest CT 08/02/17 - Calcified mediastinal and hilar lymph nodes, together with pulmonary parenchymal pattern of perihilar soft tissue confluence and perilymphatic nodularity, stable from 07/12/2015 and indicative of sarcoid. 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 Aortic atherosclerosis. Enlarged pulmonary arteries, indicative of pulmonary arterial hypertension.  PFT Results Latest Ref Rng & Units 09/06/2017  FVC-Pre L 2.37  FVC-Predicted Pre % 90  FVC-Post L 2.48  FVC-Predicted Post % 94  Pre FEV1/FVC % % 79  Post FEV1/FCV % % 83  FEV1-Pre L 1.88  FEV1-Predicted Pre % 92  DLCO UNC% % 50  DLCO COR %Predicted % 79  TLC L 4.23  TLC % Predicted % 79  RV % Predicted % 79    OV 02/10/18 - follow up Patient presents for routine follow up visit. This has been a stable interval.  Patient has been doing well on Breo. Denies any shortness of breath, chest pain, or edema. She stays active, but does have to limit herself so that she doesn't over exert. She has no new concerns or complaints today.       No Known Allergies  Immunization History  Administered Date(s) Administered  . Influenza, High Dose Seasonal PF 01/13/2018  . Influenza,inj,Quad PF,6+ Mos 05/11/2016  . Pneumococcal Conjugate-13 06/07/2014  . Pneumococcal Polysaccharide-23 09/08/2015    Past Medical History:  Diagnosis Date  . 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 Diabetes mellitus without complication ([place])   . High cholesterol   . Hypertension   . Iron deficiency anemia 03/11/2017  . Sarcoidosis     Tobacco History: Social History   Tobacco Use  Smoking Status  Never Smoker  Smokeless Tobacco Never Used   Counseling given: Not Answered   Outpatient Encounter Medications as of 02/10/2018  Medication Sig  . albuterol (PROVENTIL HFA;VENTOLIN HFA) 108 (90 Base) MCG/ACT inhaler Inhale 2 puffs into the lungs every 4 (four) hours as needed for wheezing or shortness of breath (cough, shortness of breath or wheezing.).  Marland Kitchen aspirin EC 81 MG tablet Take 81 mg by mouth.  Marland Kitchen atorvastatin (LIPITOR) 20 MG tablet Take 1 tablet (20 mg total) by mouth daily.  . blood glucose meter kit and supplies KIT Dispense based on patient and insurance preference. Use up to four times daily as directed. (FOR ICD-9 250.00, 250.01).  . Continuous Blood Gluc Receiver (FREESTYLE LIBRE 14 DAY READER) DEVI 1 Device by Does not apply route daily as needed.  . Continuous Blood Gluc Sensor (FREESTYLE LIBRE 14 DAY SENSOR) MISC 1 Units by Does not apply route every 14 (fourteen) days.  . fluticasone furoate-vilanterol (BREO ELLIPTA) 200-25 MCG/INH AEPB Inhale 1 puff into the lungs daily.  Marland Kitchen lisinopril (PRINIVIL,ZESTRIL) 10 MG tablet Take 1 tablet (10 mg total) by mouth daily.  . metFORMIN (GLUCOPHAGE-XR) 500 MG 24 hr tablet Take 2 tablets (1,000 mg total) by mouth 2 (two) times daily with a meal.  . Multiple Vitamin (MULTIVITAMIN WITH MINERALS) TABS tablet Take 1 tablet by mouth daily.  . TOUJEO SOLOSTAR 300 UNIT/ML SOPN INJECT 25 UNITS SUBCUTANEOUSLY ONCE DAILY  . fluticasone furoate-vilanterol (BREO ELLIPTA) 200-25 MCG/INH AEPB Inhale 1 puff into the lungs  daily.   No facility-administered encounter medications on file as of 02/10/2018.      Review of Systems  Review of Systems  Constitutional: Negative.  Negative for chills and fever.  HENT: Negative.   Respiratory: Negative for cough, shortness of breath and wheezing.   Cardiovascular: Negative.  Negative for chest pain, palpitations and leg swelling.  Gastrointestinal: Negative.   Allergic/Immunologic: Negative.   Neurological:  Negative.   Psychiatric/Behavioral: Negative.        Physical Exam  BP 134/72 (BP Location: Left Arm, Patient Position: Sitting, Cuff Size: Normal)   Pulse 74   Ht '5\' 6"'$  (1.676 m)   Wt 207 lb (93.9 kg)   SpO2 95%   BMI 33.41 kg/m   Wt Readings from Last 5 Encounters:  02/10/18 207 lb (93.9 kg)  02/05/18 203 lb 12.8 oz (92.4 kg)  02/03/18 202 lb (91.6 kg)  01/13/18 202 lb 6.4 oz (91.8 kg)  10/30/17 200 lb 9.6 oz (91 kg)     Physical Exam  Constitutional: She is oriented to person, place, and time. She appears well-developed and well-nourished. No distress.  Cardiovascular: Normal rate and regular rhythm.  Pulmonary/Chest: Effort normal and breath sounds normal. No respiratory distress. She has no wheezes.  Musculoskeletal: She exhibits no edema.  Neurological: She is alert and oriented to person, place, and time.  Psychiatric: She has a normal mood and affect.  Nursing note and vitals reviewed.     Assessment & Plan:   Sarcoidosis Patient Instructions  Continue Breo daily Continue Proventil as needed Stay active  Follow up with Dr. Vaughan Browner in 3 months or sooner if needed        Fenton Foy, NP 02/10/2018

## 2018-02-10 NOTE — Patient Instructions (Signed)
Continue Breo daily Continue Proventil as needed Stay active  Follow up with Dr. Isaiah Serge in 3 months or sooner if needed

## 2018-02-12 ENCOUNTER — Other Ambulatory Visit: Payer: Self-pay | Admitting: Physician Assistant

## 2018-02-12 DIAGNOSIS — Z794 Long term (current) use of insulin: Principal | ICD-10-CM

## 2018-02-12 DIAGNOSIS — E119 Type 2 diabetes mellitus without complications: Secondary | ICD-10-CM

## 2018-03-05 ENCOUNTER — Encounter: Payer: Self-pay | Admitting: Physician Assistant

## 2018-03-14 ENCOUNTER — Other Ambulatory Visit: Payer: PPO

## 2018-03-19 ENCOUNTER — Ambulatory Visit: Payer: PPO | Admitting: Endocrinology

## 2018-03-24 LAB — HM DIABETES EYE EXAM

## 2018-03-26 ENCOUNTER — Inpatient Hospital Stay: Payer: PPO | Attending: Hematology & Oncology | Admitting: Hematology & Oncology

## 2018-03-26 ENCOUNTER — Inpatient Hospital Stay: Payer: PPO

## 2018-03-31 NOTE — Progress Notes (Signed)
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  Without Macular Edema and Diabetic Type 2 Condition stable.  Left eye: DM Type 2 with ophthalmic complications Without Macular Edema Moderate non proliferative diabetic retinopathy.  Without macular edema and diabetes type 2 Condition is stable Hyperopia Astigmatism Presbyopia

## 2018-04-09 ENCOUNTER — Other Ambulatory Visit: Payer: Self-pay | Admitting: Physician Assistant

## 2018-04-09 DIAGNOSIS — Z794 Long term (current) use of insulin: Principal | ICD-10-CM

## 2018-04-09 DIAGNOSIS — E119 Type 2 diabetes mellitus without complications: Secondary | ICD-10-CM

## 2018-04-10 ENCOUNTER — Other Ambulatory Visit: Payer: Self-pay | Admitting: Physician Assistant

## 2018-04-10 DIAGNOSIS — E119 Type 2 diabetes mellitus without complications: Secondary | ICD-10-CM

## 2018-04-10 DIAGNOSIS — Z794 Long term (current) use of insulin: Principal | ICD-10-CM

## 2018-04-10 NOTE — Telephone Encounter (Signed)
Requested medication (s) are due for refill today -yes  Requested medication (s) are on the active medication list -yes  Future visit scheduled -yes  Last refill: 10/28/17  Notes to clinic: Patient is former Timmothy Euler patient- she does have an upcoming appointment. Sent for new provider refill  Requested Prescriptions  Pending Prescriptions Disp Refills   Continuous Blood Gluc Sensor (FREESTYLE LIBRE 14 DAY SENSOR) MISC [Pharmacy Med Name: FREESTYLE LIBRE SENSOR 14D KIT] 2 each 0    Sig: USE AS DIRECTED     Endocrinology: Diabetes - Testing Supplies Passed - 04/10/2018 11:38 AM      Passed - Valid encounter within last 12 months    Recent Outpatient Visits          2 months ago Type 2 diabetes mellitus without complication, with long-term current use of insulin ([place])   [facility] at [place], [place] D, PA-C   5 months ago Bronchitis with bronchospasm   [facility] at [place], [place] D, PA-C   5 months ago Bronchitis with bronchospasm   [facility] at [place], [place] D, PA-C   8 months ago Type 2 diabetes mellitus without complication, with long-term current use of insulin [facility])   Primary Care at [facility], [place] D, PA-C   9 months ago Bronchitis with bronchospasm   Primary Care at [facility], [place] D, PA-C      Future Appointments            In 1 week Forrest Moron, MD Primary Care at [place], [facility]   In 1 month Marshell Garfinkel, MD [facility] Pulmonary Care            Requested Prescriptions  Pending Prescriptions Disp Refills   Continuous Blood Gluc Sensor (FREESTYLE LIBRE 14 DAY SENSOR) [place] [Pharmacy Med Name: [place] 2 each 0    Sig: USE AS DIRECTED     Endocrinology: Diabetes - Testing Supplies Passed - 04/10/2018 11:38 AM      Passed - Valid encounter within last 12 months    Recent Outpatient Visits          2 months ago Type 2 diabetes mellitus without complication, with long-term current use of  insulin ([place])   [facility] at [place], [place] D, PA-C   5 months ago Bronchitis with bronchospasm   [facility] Care at [facility], [place] D, PA-C   5 months ago Bronchitis with bronchospasm   Primary Care at [place], [place] D, PA-C   8 months ago Type 2 diabetes mellitus without complication, with long-term current use of insulin [facility])   Primary Care at [place], [place] D, PA-C   9 months ago Bronchitis with bronchospasm   Primary Care at [facility], Reather Laurence, PA-C      Future Appointments            In 1 week Forrest Moron, MD Primary Care at [place], [facility]   In 1 month Marshell Garfinkel, MD [facility]

## 2018-04-10 NOTE — Telephone Encounter (Signed)
Requested Prescriptions  Pending Prescriptions Disp Refills  . TOUJEO SOLOSTAR 300 UNIT/ML SOPN [Pharmacy Med Name: Toujeo SoloStar 300 UNIT/ML Subcutaneous Solution Pen-injector] 6 mL 0    Sig: INJECT 25 UNITS SUBCUTANEOUSLY ONCE DAILY     Endocrinology:  Diabetes - Insulins Failed - 04/09/2018 12:18 PM      Failed - HBA1C is between 0 and 7.9 and within 180 days    Hemoglobin A1C  Date Value Ref Range Status  01/13/2018 8.5 (A) 4.0 - 5.6 % Final         Passed - Valid encounter within last 6 months    Recent Outpatient Visits          2 months ago Type 2 diabetes mellitus without complication, with long-term current use of insulin (H[facility])   [facility] at [place], [place] D, PA-C   5 months ago Bronchitis with bronchospasm   Primary [facility] at [place], [place] D, PA-C   5 months ago Bronchitis with bronchospasm   [facility] at [place], [place] D, PA-C   8 months ago Type 2 diabetes mellitus without complication, with long-term current use of insulin [facility])   Primary Care at [place], [place] D, PA-C   9 months ago Bronchitis with bronchospasm   Primary Care at [facility], Gerald Stabs, PA-C      Future Appointments            In 1 week [name], MD Primary Care at [place], [facility]   In 1 month Chilton Greathouse, MD [facility]monary Care

## 2018-04-11 ENCOUNTER — Other Ambulatory Visit: Payer: PPO

## 2018-04-11 ENCOUNTER — Other Ambulatory Visit (INDEPENDENT_AMBULATORY_CARE_PROVIDER_SITE_OTHER): Payer: PPO

## 2018-04-11 DIAGNOSIS — Z794 Long term (current) use of insulin: Secondary | ICD-10-CM

## 2018-04-11 DIAGNOSIS — E1165 Type 2 diabetes mellitus with hyperglycemia: Secondary | ICD-10-CM | POA: Diagnosis not present

## 2018-04-11 NOTE — Addendum Note (Signed)
Addended by: Mervin Kung A on: 04/11/2018 02:25 PM   Modules accepted: Orders

## 2018-04-12 ENCOUNTER — Other Ambulatory Visit: Payer: Self-pay | Admitting: Physician Assistant

## 2018-04-12 DIAGNOSIS — I10 Essential (primary) hypertension: Secondary | ICD-10-CM

## 2018-04-12 DIAGNOSIS — E785 Hyperlipidemia, unspecified: Secondary | ICD-10-CM

## 2018-04-14 LAB — BASIC METABOLIC PANEL
BUN/Creatinine Ratio: 10 — ABNORMAL LOW (ref 12–28)
BUN: 12 mg/dL (ref 8–27)
CO2: 25 mmol/L (ref 20–29)
Calcium: 9.7 mg/dL (ref 8.7–10.3)
Chloride: 101 mmol/L (ref 96–106)
Creatinine, Ser: 1.2 mg/dL — ABNORMAL HIGH (ref 0.57–1.00)
GFR calc Af Amer: 53 mL/min/{1.73_m2} — ABNORMAL LOW (ref >59)
GFR calc non Af Amer: 46 mL/min/{1.73_m2} — ABNORMAL LOW (ref >59)
Glucose: 250 mg/dL — ABNORMAL HIGH (ref 65–99)
Potassium: 5.6 mmol/L — ABNORMAL HIGH (ref 3.5–5.2)
Sodium: 140 mmol/L (ref 134–144)

## 2018-04-14 LAB — FRUCTOSAMINE: Fructosamine: 411 umol/L — ABNORMAL HIGH (ref 0–285)

## 2018-04-14 NOTE — Telephone Encounter (Signed)
Requested Prescriptions  Pending Prescriptions Disp Refills  . lisinopril (PRINIVIL,ZESTRIL) 10 MG tablet [Pharmacy Med Name: Lisinopril 10 MG Oral Tablet] 90 tablet 0    Sig: TAKE 1 TABLET BY MOUTH ONCE DAILY     Cardiovascular:  ACE Inhibitors Passed - 04/12/2018  5:53 PM      Passed - Cr in normal range and within 180 days    Creatinine  Date Value Ref Range Status  02/05/2018 0.87 0.44 - 1.00 mg/dL Final   Creat  Date Value Ref Range Status  03/11/2017 0.7 0.6 - 1.2 mg/dl Final   Creatinine, Ser  Date Value Ref Range Status  04/11/2018 1.20 (H) 0.57 - 1.00 mg/dL Final         Passed - K in normal range and within 180 days    Potassium  Date Value Ref Range Status  04/11/2018 5.6 (H) 3.5 - 5.2 mmol/L Final  03/11/2017 4.2 3.3 - 4.7 mEq/L Final         Passed - Patient is not pregnant      Passed - Last BP in normal range    BP Readings from Last 1 Encounters:  02/10/18 134/72         Passed - Valid encounter within last 6 months    Recent Outpatient Visits          3 months ago Type 2 diabetes mellitus without complication, with long-term current use of insulin (HCC)   [facility] [place], [place], PA-C   5 months ago Bronchitis with bronchospasm   [facility] at [place], [place], PA-C   5 months ago Bronchitis with bronchospasm   [facility] at [place], [place], PA-C   8 months ago Type 2 diabetes mellitus without complication, with long-term current use of insulin [facility])   [facility] at [place], [place], PA-C   9 months ago Bronchitis with bronchospasm   [facility] at [facility], Gerald Stabs, PA-C      Future Appointments            In 1 week [name], MD [facility] at [place], [place]   In 1 month Chilton Greathouse, MD [facility]         . atorvastatin (LIPITOR) 20 MG tablet [Pharmacy Med Name: Atorvastatin Calcium 20 MG Oral Tablet] 90 tablet 0    Sig: TAKE 1 TABLET BY MOUTH ONCE DAILY      Cardiovascular:  Antilipid - Statins Passed - 04/12/2018  5:53 PM      Passed - Total Cholesterol in normal range and within 360 days    Cholesterol, Total  Date Value Ref Range Status  01/13/2018 143 100 - 199 mg/dL Final         Passed - LDL in normal range and within 360 days    LDL Calculated  Date Value Ref Range Status  01/13/2018 71 0 - 99 mg/dL Final         Passed - HDL in normal range and within 360 days    HDL  Date Value Ref Range Status  01/13/2018 60 >39 mg/dL Final         Passed - Triglycerides in normal range and within 360 days    Triglycerides  Date Value Ref Range Status  01/13/2018 59 0 - 149 mg/dL Final         Passed - Patient is not pregnant      Passed - Valid encounter  within last 12 months    Recent Outpatient Visits          3 months ago Type 2 diabetes mellitus without complication, with long-term current use of insulin (HCC)   [facility] at [place], [place], PA-C   5 months ago Bronchitis with bronchospasm   [facility] [place], [place], PA-C   5 months ago Bronchitis with bronchospasm   [facility] at [place], [place], PA-C   8 months ago Type 2 diabetes mellitus without complication, with long-term current use of insulin [facility])   [facility] Care at [facility], [place], PA-C   9 months ago Bronchitis with bronchospasm   [facility] at [facility], Gerald Stabs, PA-C      Future Appointments            In 1 week [name], MD [facility] at [place], [facility]   In 1 month Chilton Greathouse, MD [facility]

## 2018-04-15 ENCOUNTER — Encounter: Payer: Self-pay | Admitting: Endocrinology

## 2018-04-15 ENCOUNTER — Ambulatory Visit (INDEPENDENT_AMBULATORY_CARE_PROVIDER_SITE_OTHER): Payer: PPO | Admitting: Endocrinology

## 2018-04-15 VITALS — BP 142/88 | HR 80 | Ht 66.0 in | Wt 199.0 lb

## 2018-04-15 DIAGNOSIS — E1165 Type 2 diabetes mellitus with hyperglycemia: Secondary | ICD-10-CM | POA: Diagnosis not present

## 2018-04-15 DIAGNOSIS — R809 Proteinuria, unspecified: Secondary | ICD-10-CM | POA: Diagnosis not present

## 2018-04-15 DIAGNOSIS — E1129 Type 2 diabetes mellitus with other diabetic kidney complication: Secondary | ICD-10-CM | POA: Diagnosis not present

## 2018-04-15 DIAGNOSIS — Z794 Long term (current) use of insulin: Secondary | ICD-10-CM | POA: Diagnosis not present

## 2018-04-15 LAB — POCT GLYCOSYLATED HEMOGLOBIN (HGB A1C): Hemoglobin A1C: 8.3 % — AB (ref 4.0–5.6)

## 2018-04-15 MED ORDER — DULAGLUTIDE 0.75 MG/0.5ML ~~LOC~~ SOAJ: 0.7500 mg | SUBCUTANEOUS | 0 refills | Status: DC

## 2018-04-15 NOTE — Patient Instructions (Signed)
Toujeo 20 units daily  Check blood sugars on waking up 7 days a week  Also check blood sugars about 2 hours after meals and do this after different meals by rotation  Recommended blood sugar levels on waking up are 90-130 and about 2 hours after meal is 130-160  Please bring your blood sugar monitor to each visit, thank you   Start TRULICITYwith the pen as shown once weekly on the same day of the week.  You may inject in the stomach, thigh or arm as indicated in the brochure given.  You will feel fullness of the stomach with starting the medication and should try to keep the portions at meals small.  You may experience nausea in the first few days which usually gets better over time    If any questions or concerns are present call the office or the  [facility] at [phone].   Also visit [email or web address] website for more useful information

## 2018-04-15 NOTE — Progress Notes (Signed)
Patient ID: Jeanette Ellis, female   DOB: 20-May-1950, 68 y.o.   MRN: [id number]           Reason for Appointment: for Type 2 Diabetes  Referring ONG:[id number] Barnett Abu   History of Present Illness:          Date of diagnosis of type 2 diabetes mellitus: 1998       Background history:   She has been on various treatments for her diabetes over the last several years and mostly based on metformin Recent records indicate that she had been given a trial of Invokana but at least last year and also Trulicity recently Not clear what her previous level of control was but her A1c has been between 8 and 9 in 2018 Because of persistently high readings she was started on Toujeo insulin in 11/2016 However her blood sugars were still high with A1c 9% in December partly related to her not being able to afford her medications and not continuing insulin prior to her visit  Recent history:   INSULIN regimen is:  TOUJEO 25 U in am      Non-insulin hypoglycemic drugs the patient is taking are: Metformin ER, 2 tablets twice daily  A1c is 8.3, previously 8.5 She has not been seen in follow-up since her initial consultation in 11/2017  Current management, blood sugar patterns and problems identified:  She has been out of her insulin for the month of December and started taking it only about 3 days ago, this is because of her donut hole  With this her blood sugars at home have been as high as 320  Her PCP has been able to provide her with the freestyle libre sensor but she is checking readings regularly and not applying this consistently even though it stays on for 14 days  Although she has seen her dietitian the second time last October she only recently started watching her diet with cutting back on carbohydrate, high-fat foods fast food  Is losing weight recently  With starting back on her insulin and improving her diet her blood sugar was down to 58 this morning.  Not checking her sugars after  meals  Her PCP told her to start Trulicity 0.75 mg weekly and she has just picked it up, this is not on her medication list and she does not think she has taken this before.  She does not do any regular exercise and only walks when the weather is good       Side effects from medications: None  Compliance with the medical regimen: Variable  Hypoglycemia:  none  Glucose monitoring:           Glucometer:  Freestyle libre      Blood Glucose readings by download shows the following:  Average 188 but has only data for 28% of the time in the last 2 weeks and has 58% of her readings above 180  Self-care: Marland Kitchen     Typical meal intake: Breakfast is oatmeal, grits or cereal             Dietician visit, most recent:9/18                Weight history:  Wt Readings from Last 3 Encounters:  04/15/18 199 lb (90.3 kg)  02/10/18 207 lb (93.9 kg)  02/05/18 203 lb 12.8 oz (92.4 kg)    Glycemic control:   Lab Results  Component Value Date   HGBA1C 8.3 (A) 04/15/2018   HGBA1C 8.5 (  A) 01/13/2018   HGBA1C 7.6 (A) 09/19/2017   Lab Results  Component Value Date   LDLCALC 71 01/13/2018   CREATININE 1.20 (H) 04/11/2018   No results found for: [facility]  Lab Results  Component Value Date   FRUCTOSAMINE 411 (H) 04/11/2018      Allergies as of 04/15/2018   No Known Allergies     Medication List       Accurate as of April 15, 2018  9:00 PM. Always use your most recent med list.        albuterol 108 (90 Base) MCG/ACT inhaler Commonly known as:  PROVENTIL HFA;VENTOLIN HFA Inhale 2 puffs into the lungs every 4 (four) hours as needed for wheezing or shortness of breath (cough, shortness of breath or wheezing.).   aspirin EC 81 MG tablet Take 81 mg by mouth.   atorvastatin 20 MG tablet Commonly known as:  LIPITOR TAKE 1 TABLET BY MOUTH ONCE DAILY   Dulaglutide 0.75 MG/0.5ML Sopn Commonly known as:  TRULICITY Inject 0.75 mg into the skin once a week.   fluticasone  furoate-vilanterol 200-25 MCG/INH Aepb Commonly known as:  BREO ELLIPTA Inhale 1 puff into the lungs daily.   FREESTYLE LIBRE 14 DAY READER Devi 1 Device by Does not apply route daily as needed.   FREESTYLE LIBRE 14 DAY SENSOR Misc USE AS DIRECTED   lisinopril 10 MG tablet Commonly known as:  PRINIVIL,ZESTRIL TAKE 1 TABLET BY MOUTH ONCE DAILY   metFORMIN 500 MG 24 hr tablet Commonly known as:  GLUCOPHAGE-XR TAKE 2 TABLETS BY MOUTH TWICE DAILY WITH MEALS   multivitamin with minerals Tabs tablet Take 1 tablet by mouth daily.   TOUJEO SOLOSTAR 300 UNIT/ML Sopn Generic drug:  Insulin Glargine (1 Unit Dial) INJECT 25 UNITS SUBCUTANEOUSLY ONCE DAILY       Allergies: No Known Allergies  Past Medical History:  Diagnosis Date  . Anemia associated with stage 3 chronic renal failure (HCC) 03/11/2017  . Diabetes mellitus without complication (HCC)   . High cholesterol   . Hypertension   . Iron deficiency anemia 03/11/2017  . Sarcoidosis     Past Surgical History:  Procedure Laterality Date  . ABDOMINAL HYSTERECTOMY      Family History  Problem Relation Age of Onset  . High blood pressure Mother   . Diabetes Father     Social History:  reports that she has never smoked. She has never used smokeless tobacco. She reports that she does not drink alcohol or use drugs.   Review of Systems     Lipid history: On Lipitor 20 mg from PCP    Lab Results  Component Value Date   CHOL 143 01/13/2018   HDL 60 01/13/2018   LDLCALC 71 01/13/2018   TRIG 59 01/13/2018   CHOLHDL 2.4 01/13/2018           Hypertension: Treated with lisinopril 10 mg by PCP with variable readings  BP Readings from Last 3 Encounters:  04/15/18 (!) 142/88  02/10/18 134/72  02/05/18 (!) 151/85    Most recent eye exam was in 12/18, has moderate nonproliferative retinopathy   Most recent foot exam: 6/19  Complications of diabetes: Microalbuminuria, background retinopathy   LABS:  Office  Visit on 04/15/2018  Component Date Value Ref Range Status  . Hemoglobin A1C 04/15/2018 8.3* 4.0 - 5.6 % Final  Lab on 04/11/2018  Component Date Value Ref Range Status  . Fructosamine 04/11/2018 411* 0 - 285 umol/L Final   Comment:  Published reference interval for apparently healthy subjects between age 70 and 8960 is 30205 - 285 umol/L and in a poorly controlled diabetic population is 228 - 563 umol/L with a mean of 396 umol/L.   Marland Kitchen. Glucose 04/11/2018 250* 65 - 99 mg/dL Final  . BUN 16/10/960401/06/2018 12  8 - 27 mg/dL Final  . Creatinine, Ser 04/11/2018 1.20* 0.57 - 1.00 mg/dL Final  . GFR calc non Af Amer 04/11/2018 46* >59 mL/min/1.73 Final  . GFR calc Af Amer 04/11/2018 53* >59 mL/min/1.73 Final  . BUN/Creatinine Ratio 04/11/2018 10* 12 - 28 Final  . Sodium 04/11/2018 140  134 - 144 mmol/L Final  . Potassium 04/11/2018 5.6* 3.5 - 5.2 mmol/L Final  . Chloride 04/11/2018 101  96 - 106 mmol/L Final  . CO2 04/11/2018 25  20 - 29 mmol/L Final  . Calcium 04/11/2018 9.7  8.7 - 10.3 mg/dL Final    Physical Examination:  BP (!) 142/88 (BP Location: Left Arm, Patient Position: Sitting, Cuff Size: Normal)   Pulse 80   Ht 5\' 6"  (1.676 m)   Wt 199 lb (90.3 kg)   SpO2 97%   BMI 32.12 kg/m        ASSESSMENT:  Diabetes type 2, uncontrolled with A1c usually over 8%  Her A1c is still over 8% and fructosamine is 411  She is being seen in follow-up for over 6 months She still is on the same regimen of basal insulin and metformin with inadequate control Also previously had been doing poorly with meal planning and despite seeing the dietitian a couple of times has only recently started improving her diet  She has difficulty affording brand-name medications  Currently on a regular regimen of basal insulin along with metformin she still has inadequate control and difficult to assess her treatment because of her running out of insulin last month causing more hyperglycemia However the last couple of  days her blood sugars are progressively lower with fasting reading only 68 this morning after her improving her diet  PLAN:    She will check blood sugars 4 times a day and discussed the need for getting more data from her freestyle libre sensor and not just check sugars randomly once or twice a day or less  She needs to keep the sensor on consistently She may benefit from Trulicity with control of postprandial readings and this will work well with metformin and basal insulin Discussed with the patient the nature of GLP-1 drugs, the action on various organ systems, improved satiety, how they benefit blood glucose control, as well as the benefit of weight loss. Explained possible side effects of TRULICITY, most commonly nausea that usually improves over time; discussed safety information in package insert.  Reviewed the injection device and she will call if she has any questions on this To start with 0.75 mg dosage weekly for the first 4 injections and then reassess dose Patient brochure on Trulicity with enclosed co-pay card given  TOUJEO will be reduced to 20 units to avoid overnight hypoglycemia  Continue metformin unchanged  Reassess blood sugar control with fructosamine in 4 weeks  Regular eye exams  More regular follow-up  Patient Instructions  Toujeo 20 units daily  Check blood sugars on waking up 7 days a week  Also check blood sugars about 2 hours after meals and do this after different meals by rotation  Recommended blood sugar levels on waking up are 90-130 and about 2 hours after meal is 130-160  Please  bring your blood sugar monitor to each visit, thank you   Start TRULICITYwith the pen as shown once weekly on the same day of the week.  You may inject in the stomach, thigh or arm as indicated in the brochure given.  You will feel fullness of the stomach with starting the medication and should try to keep the portions at meals small.  You may experience nausea in the  first few days which usually gets better over time    If any questions or concerns are present call the office or the  [facility] at [phone].   Also visit [email or web address] website for more useful information        Counseling time on subjects discussed in assessment and plan sections is over 50% of today's 25 minute visit      Reather Littler 04/15/2018, 9:00 PM   Note: This office note was prepared with Dragon voice recognition system technology. Any transcriptional errors that result from this process are unintentional.

## 2018-04-21 ENCOUNTER — Encounter: Payer: Self-pay | Admitting: Family Medicine

## 2018-04-21 ENCOUNTER — Other Ambulatory Visit: Payer: Self-pay

## 2018-04-21 ENCOUNTER — Ambulatory Visit (INDEPENDENT_AMBULATORY_CARE_PROVIDER_SITE_OTHER): Payer: HMO | Admitting: Family Medicine

## 2018-04-21 VITALS — BP 132/72 | HR 78 | Temp 97.9°F | Resp 20 | Ht 67.28 in | Wt 195.8 lb

## 2018-04-21 DIAGNOSIS — I10 Essential (primary) hypertension: Secondary | ICD-10-CM

## 2018-04-21 DIAGNOSIS — M5489 Other dorsalgia: Secondary | ICD-10-CM

## 2018-04-21 DIAGNOSIS — D869 Sarcoidosis, unspecified: Secondary | ICD-10-CM | POA: Diagnosis not present

## 2018-04-21 DIAGNOSIS — Z794 Long term (current) use of insulin: Secondary | ICD-10-CM | POA: Diagnosis not present

## 2018-04-21 DIAGNOSIS — R8271 Bacteriuria: Secondary | ICD-10-CM

## 2018-04-21 DIAGNOSIS — Z23 Encounter for immunization: Secondary | ICD-10-CM

## 2018-04-21 DIAGNOSIS — M549 Dorsalgia, unspecified: Secondary | ICD-10-CM | POA: Diagnosis not present

## 2018-04-21 DIAGNOSIS — E119 Type 2 diabetes mellitus without complications: Secondary | ICD-10-CM

## 2018-04-21 LAB — POCT URINALYSIS DIP (MANUAL ENTRY)
Bilirubin, UA: NEGATIVE
Blood, UA: NEGATIVE
Glucose, UA: NEGATIVE mg/dL
Ketones, POC UA: NEGATIVE mg/dL
Nitrite, UA: NEGATIVE
Protein Ur, POC: NEGATIVE mg/dL
Spec Grav, UA: <=1.005 — AB (ref 1.010–1.025)
Urobilinogen, UA: 0.2 E.U./dL
pH, UA: 5.5 (ref 5.0–8.0)

## 2018-04-21 NOTE — Progress Notes (Signed)
Established Patient Office Visit  Subjective:  Patient ID: Jeanette Ellis, female    DOB: [birth year]  Age: 68 y.o. MRN: [id number]  CC:  Chief Complaint  Patient presents with  . Diabetes  . Back Pain    X 2 mth- lower back pain    HPI Jeanette SodaYvella S Cornman presents for establishing care with a new provider  She has some low back discomfort She states that when she gets up she feels stiff in the morning She states that the back pain is improved when she drinks more water  She has a history of sarcoid but reports that it has been mild and stable She has a very strong family history of sarcoid and had a sister who died from complications She gets sob and wheezing when her sarcoid acts up   Diabetes Mellitus:  Her a1c range from 7.6-8.3 and she checks her sugars with a freestyle libre She thinks her sugars are high due to the holiday eating She reports hypoglycemia that once last week her sugar was 40 When she takes her meds and eats a diabetic diet she gets sugars less than 90 Metformin, toujeo 25 units, Dulaglutide once a week She states that she struggles with a diabetic diet   Hypertension: Patient here for follow-up of elevated blood pressure. She is not exercising and is adherent to low salt diet.  Blood pressure is well controlled at home. Cardiac symptoms none. Patient denies chest pain, chest pressure/discomfort, claudication, dyspnea and exertional chest pressure/discomfort.  BP Readings from Last 3 Encounters:  04/21/18 132/72  04/15/18 (!) 142/88     Past Medical History:  Diagnosis Date  . Anemia associated with stage 3 chronic renal failure (HCC) 03/11/2017  . Diabetes mellitus without complication (HCC)   . High cholesterol   . Hypertension   . Iron deficiency anemia 03/11/2017  . Sarcoidosis     Past Surgical History:  Procedure Laterality Date  . ABDOMINAL HYSTERECTOMY      Family History  Problem Relation Age of Onset  . High blood pressure  Mother   . Diabetes Father     Social History   Socioeconomic History  . Marital status: Married    Spouse name: Zynzulu  . Number of children: 4  . Years of education: Not on file  . Highest education level: Not on file  Occupational History  . Not on file  Social Needs  . Financial resource strain: Not on file  . Food insecurity:    Worry: Never true    Inability: Never true  . Transportation needs:    Medical: No    Non-medical: No  Tobacco Use  . Smoking status: Never Smoker  . Smokeless tobacco: Never Used  Substance and Sexual Activity  . Alcohol use: No  . Drug use: No  . Sexual activity: Not Currently  Lifestyle  . Physical activity:    Days per week: Not on file    Minutes per session: Not on file  . Stress: Not on file  Relationships  . Social connections:    Talks on phone: Not on file    Gets together: Not on file    Attends religious service: Not on file    Active member of club or organization: Not on file    Attends meetings of clubs or organizations: Not on file    Relationship status: Not on file  . Intimate partner violence:    Fear of current or ex partner: Not on  file    Emotionally abused: Not on file    Physically abused: Not on file    Forced sexual activity: Not on file  Other Topics Concern  . Not on file  Social History Narrative  . Not on file    Outpatient Medications Prior to Visit  Medication Sig Dispense Refill  . albuterol (PROVENTIL HFA;VENTOLIN HFA) 108 (90 Base) MCG/ACT inhaler Inhale 2 puffs into the lungs every 4 (four) hours as needed for wheezing or shortness of breath (cough, shortness of breath or wheezing.). 1 Inhaler 1  . Continuous Blood Gluc Receiver (FREESTYLE LIBRE 14 DAY READER) DEVI 1 Device by Does not apply route daily as needed. 1 Device 0  . fluticasone furoate-vilanterol (BREO ELLIPTA) 200-25 MCG/INH AEPB Inhale 1 puff into the lungs daily. 60 each 1  . Multiple Vitamin (MULTIVITAMIN WITH MINERALS) TABS  tablet Take 1 tablet by mouth daily.    Marland Kitchen atorvastatin (LIPITOR) 20 MG tablet TAKE 1 TABLET BY MOUTH ONCE DAILY 90 tablet 0  . Continuous Blood Gluc Sensor (FREESTYLE LIBRE 14 DAY SENSOR) MISC USE AS DIRECTED 2 each 0  . Dulaglutide (TRULICITY) 0.75 MG/0.5ML SOPN Inject 0.75 mg into the skin once a week. 4 pen 0  . lisinopril (PRINIVIL,ZESTRIL) 10 MG tablet TAKE 1 TABLET BY MOUTH ONCE DAILY 90 tablet 0  . metFORMIN (GLUCOPHAGE-XR) 500 MG 24 hr tablet TAKE 2 TABLETS BY MOUTH TWICE DAILY WITH MEALS 360 tablet 0  . TOUJEO SOLOSTAR 300 UNIT/ML SOPN INJECT 25 UNITS SUBCUTANEOUSLY ONCE DAILY 6 mL 0  . aspirin EC 81 MG tablet Take 81 mg by mouth.     No facility-administered medications prior to visit.     No Known Allergies  ROS Review of Systems See hpi   Objective:    Physical Exam  BP 132/72   Pulse 78   Temp 97.9 F (36.6 C) (Oral)   Resp 20   Ht 5' 7.28" (1.709 m)   Wt 195 lb 12.8 oz (88.8 kg)   SpO2 100%   BMI 30.41 kg/m  Wt Readings from Last 3 Encounters:  07/28/18 195 lb (88.5 kg)  07/07/18 195 lb (88.5 kg)  04/21/18 195 lb 12.8 oz (88.8 kg)   No tenderness over the SI joint No tenderness over the spinal processes Straight leg raise normal Normal ROM   Health Maintenance Due  Topic Date Due  . Hepatitis C Screening  06/12/1950    There are no preventive care reminders to display for this patient.  Lab Results  Component Value Date   TSH 3.500 08/07/2017   Lab Results  Component Value Date   WBC 4.6 02/05/2018   HGB 10.0 (L) 02/05/2018   HCT 33.2 (L) 02/05/2018   MCV 86.5 02/05/2018   PLT 233 02/05/2018   Lab Results  Component Value Date   NA 144 07/28/2018   K 5.2 07/28/2018   CO2 19 (L) 07/28/2018   GLUCOSE 70 07/28/2018   BUN 11 07/28/2018   CREATININE 0.83 07/28/2018   BILITOT 0.5 07/28/2018   ALKPHOS 62 07/28/2018   AST 33 07/28/2018   ALT 20 07/28/2018   PROT 7.4 07/28/2018   ALBUMIN 4.2 07/28/2018   CALCIUM 9.3 07/28/2018    ANIONGAP 8 02/05/2018   Lab Results  Component Value Date   CHOL 143 01/13/2018   Lab Results  Component Value Date   HDL 60 01/13/2018   Lab Results  Component Value Date   LDLCALC 71 01/13/2018   Lab Results  Component Value Date   TRIG 59 01/13/2018   Lab Results  Component Value Date   CHOLHDL 2.4 01/13/2018   Lab Results  Component Value Date   HGBA1C 6.8 (A) 07/28/2018   CLINICAL DATA:  Extreme shortness of breath.  Sarcoid.  EXAM: CT CHEST WITHOUT CONTRAST  TECHNIQUE: Multidetector CT imaging of the chest was performed following the standard protocol without intravenous contrast. High resolution imaging of the lungs, as well as inspiratory and expiratory imaging, was performed.  COMPARISON:  01/09/2016, 07/12/2015 and 10/06/2008.  FINDINGS: Cardiovascular: Atherosclerotic calcification of the arterial vasculature. Pulmonary arteries and heart are enlarged. No pericardial effusion.  Mediastinum/Nodes: Calcified bulky mediastinal and hilar lymph nodes. Partially calcified subcentimeter short axis prepericardiac lymph nodes. Axillary lymph nodes are not enlarged by CT size criteria. Esophagus is grossly unremarkable.  Lungs/Pleura: Biapical pleuroparenchymal scarring. Perihilar confluent soft tissue, bronchiectasis and peribronchovascular nodularity. Multiple scattered pulmonary nodules measure up to 8 mm in the apical segment right upper lobe, as before. Mild scattered bronchiolectasis. Overall severely and distribution of the findings appear stable from 07/12/2015. There are nodules along the fissures. 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 No pleural fluid. Small diverticulum is seen off the lower trachea. Adherent debris is seen within the airway. No air trapping.  Upper Abdomen: Visualized portions of the liver, adrenal glands, kidneys, spleen, pancreas, stomach and bowel are grossly unremarkable. Cholecystectomy. Upper  abdominal lymph nodes measure up to approximately 11 mm in the gastrohepatic ligament, similar.  Musculoskeletal: Degenerative changes in the spine. No worrisome lytic or sclerotic lesions.  IMPRESSION: 1. Calcified mediastinal and hilar lymph nodes, together with pulmonary parenchymal pattern of perihilar soft tissue confluence and perilymphatic nodularity, stable from 07/12/2015 and indicative of sarcoid. 2. 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 3. Aortic atherosclerosis (ICD10-170.0). 4. Enlarged pulmonary arteries, indicative of pulmonary arterial hypertension.   Electronically Signed   By: Leanna Battles M.D.   On: 08/02/2017 14:11   Assessment & Plan:   Problem List Items Addressed This Visit      Cardiovascular and Mediastinum   Essential hypertension  -  Patient's blood pressure is at goal of 139/89 or less. Condition is stable. Continue current medications and treatment plan. I recommend that you exercise for 30-45 minutes 5 days a week. I also recommend a balanced diet with fruits and vegetables every day, lean meats, and little fried foods. The DASH diet (you can find this online) is a good example of this.      Endocrine   Type 2 diabetes mellitus without complication, with long-term current use of insulin (HCC) - Primary   -  Diabetes well controlled     Other   Sarcoidosis  -  stable    Other Visit Diagnoses    Need for tetanus booster       Relevant Orders   Td : Tetanus/diphtheria >7yo Preservative  free (Completed)   Other acute back pain    - will evaluate for uti   Relevant Orders   POCT urinalysis dipstick (Completed)   Urine Culture (Completed)   GBS bacteriuria    -  Based on culture      Meds ordered this encounter  Medications  . cephALEXin (KEFLEX) 500 MG capsule    Sig: Take 1 capsule (500 mg total) by mouth 3 (three) times daily for 7 days.    Dispense:  21 capsule    Refill:  0    Follow-up: No  follow-ups on file.    Margeret Stachnik  Elsie Lincoln, MD

## 2018-04-21 NOTE — Patient Instructions (Addendum)
If you have lab work done today you will be contacted with your lab results within the next 2 weeks.  If you have not heard from [place] then please contact us. The fastest way to get your results is to register for My Chart.   IF you received an x-ray today, you will receive an invoice from [facility]. Please contact [facility] at [phone] with questions or concerns regarding your invoice.   IF you received labwork today, you will receive an invoice from [facility]. Please contact [facility] at [phone] with questions or concerns regarding your invoice.   Our billing staff will not be able to assist you with questions regarding bills from these companies.  You will be contacted with the lab results as soon as they are available. The fastest way to get your results is to activate your My Chart account. Instructions are located on the last page of this paperwork. If you have not heard from [place] regarding the results in 2 weeks, please contact this office.     Preventing Hypoglycemia Hypoglycemia occurs when the level of sugar (glucose) in the blood is too low. Hypoglycemia can happen in people who do or do not have diabetes (diabetes mellitus). It can develop quickly, and it can be a medical emergency. For most people with diabetes, a blood glucose level below 70 mg/dL (3.9 mmol/L) is considered hypoglycemia. Glucose is a type of sugar that provides the body's main source of energy. Certain hormones (insulin and glucagon) control the level of glucose in the blood. Insulin lowers blood glucose, and glucagon increases blood glucose. Hypoglycemia can result from having too much insulin in the bloodstream, or from not eating enough food that contains glucose. Your risk for hypoglycemia is higher:  If you take insulin or diabetes medicines to help lower your blood glucose or help your body make more insulin.  If you skip or delay a meal or snack.  If you are ill.  During  and after exercise. You can prevent hypoglycemia by working with your health care provider to adjust your meal plan as needed and by taking other precautions. How can hypoglycemia affect me? Mild symptoms Mild hypoglycemia may not cause any symptoms. If you do have symptoms, they may include:  Hunger.  Anxiety.  Sweating and feeling clammy.  Dizziness or feeling light-headed.  Sleepiness.  Nausea.  Increased heart rate.  Headache.  Blurry vision.  Irritability.  Tingling or numbness around the mouth, lips, or tongue.  A change in coordination.  Restless sleep. If mild hypoglycemia is not recognized and treated, it can quickly become moderate or severe hypoglycemia. Moderate symptoms Moderate hypoglycemia can cause:  Mental confusion and poor judgment.  Behavior changes.  Weakness.  Irregular heartbeat. Severe symptoms Severe hypoglycemia is a medical emergency. It can cause:  Fainting.  Seizures.  Loss of consciousness (coma).  Death. What nutrition changes can be made?  Work with your health care provider or diet and nutrition specialist (dietitian) to make a healthy meal plan that is right for you. Follow your meal plan carefully.  Eat meals at regular times.  If recommended by your health care provider, have snacks between meals.  Donot skip or delay meals or snacks. You can be at risk for hypoglycemia if you are not getting enough carbohydrates. What lifestyle changes can be made?   Work closely with your health care provider to manage your blood glucose. Make sure you know: ? Your goal blood glucose levels. ?  How and when to check your blood glucose. ? The symptoms of hypoglycemia. It is important to treat it right away to keep it from becoming severe.  Do not drink alcohol on an empty stomach.  When you are ill, check your blood glucose more often than usual. Follow your sick day plan whenever you cannot eat or drink normally. Make this  plan in advance with your health care provider.  Always check your blood glucose before, during, and after exercise. How is this treated? This condition can often be treated by immediately eating or drinking something that contains sugar, such as:  Fruit juice, 4-6 oz (120-150 mL).  Regular (not diet) soda, 4-6 oz (120-150 mL).  Low-fat milk, 4 oz (120 mL).  Several pieces of hard candy.  Sugar or honey, 1 Tbsp (15 mL). Treating hypoglycemia if you have diabetes If you are alert and able to swallow safely, follow the 15:15 rule:  Take 15 grams of a rapid-acting carbohydrate. Talk with your health care provider about how much you should take.  Rapid-acting options include: ? Glucose pills (take 15 grams). ? 6-8 pieces of hard candy. ? 4-6 oz (120-150 mL) of fruit juice. ? 4-6 oz (120-150 mL) of regular (not diet) soda.  Check your blood glucose 15 minutes after you take the carbohydrate.  If the repeat blood glucose level is still at or below 70 mg/dL (3.9 mmol/L), take 15 grams of a carbohydrate again.  If your blood glucose level does not increase above 70 mg/dL (3.9 mmol/L) after 3 tries, seek emergency medical care.  After your blood glucose level returns to normal, eat a meal or a snack within 1 hour. Treating severe hypoglycemia Severe hypoglycemia is when your blood glucose level is at or below 54 mg/dL (3 mmol/L). Severe hypoglycemia is a medical emergency. Get medical help right away. If you have severe hypoglycemia and you cannot eat or drink, you may need an injection of glucagon. A family member or close friend should learn how to check your blood glucose and how to give you a glucagon injection. Ask your health care provider if you need to have an emergency glucagon injection kit available. Severe hypoglycemia may need to be treated in a hospital. The treatment may include getting glucose through an IV. You may also need treatment for the cause of your  hypoglycemia. Where to find more information  American Diabetes Association: www.diabetes.[employer] of Diabetes and Digestive and Kidney Diseases: [employer] Contact a health care provider if:  You have problems keeping your blood glucose in your target range.  You have frequent episodes of hypoglycemia. Get help right away if:  You continue to have hypoglycemia symptoms after eating or drinking something containing glucose.  Your blood glucose level is at or below 54 mg/dL (3 mmol/L).  You faint.  You have a seizure. These symptoms may represent a serious problem that is an emergency. Do not wait to see if the symptoms will go away. Get medical help right away. Call your local emergency services (911 in the [place] Summary  Know the symptoms of hypoglycemia, and when you are at risk for it (such as during exercise or when you are sick). Check your blood glucose often when you are at risk for hypoglycemia.  Hypoglycemia can develop quickly, and it can be dangerous if it is not treated right away. If you have a history of severe hypoglycemia, make sure you know how to use your glucagon injection kit.  Make sure you know how to treat hypoglycemia. Keep a carbohydrate snack available when you may be at risk for hypoglycemia. This information is not intended to replace advice given to you by your health care provider. Make sure you discuss any questions you have with your health care provider. Document Released: 11/21/2016 Document Revised: 09/16/2017 Document Reviewed: 11/21/2016 Elsevier Interactive Patient Education  2019 [employer].

## 2018-04-22 LAB — URINE CULTURE

## 2018-05-01 ENCOUNTER — Inpatient Hospital Stay: Payer: Self-pay

## 2018-05-01 ENCOUNTER — Inpatient Hospital Stay: Payer: Self-pay | Attending: Hematology & Oncology | Admitting: Hematology & Oncology

## 2018-05-14 ENCOUNTER — Ambulatory Visit: Payer: PPO | Admitting: Endocrinology

## 2018-05-14 DIAGNOSIS — Z0289 Encounter for other administrative examinations: Secondary | ICD-10-CM

## 2018-05-15 ENCOUNTER — Other Ambulatory Visit: Payer: Self-pay | Admitting: Family Medicine

## 2018-05-15 DIAGNOSIS — Z794 Long term (current) use of insulin: Principal | ICD-10-CM

## 2018-05-15 DIAGNOSIS — E119 Type 2 diabetes mellitus without complications: Secondary | ICD-10-CM

## 2018-05-15 MED ORDER — CEPHALEXIN 500 MG PO CAPS: 500.0000 mg | ORAL_CAPSULE | Freq: Three times a day (TID) | ORAL | 0 refills | Status: AC

## 2018-05-15 NOTE — Progress Notes (Signed)
While speaking with patient regarding a refill, patient inquired about abx that was phoned in today to her pharmacy. Reviewed lab results and physician's note with patient.

## 2018-05-19 ENCOUNTER — Ambulatory Visit: Payer: PPO | Admitting: Pulmonary Disease

## 2018-05-20 ENCOUNTER — Other Ambulatory Visit: Payer: Self-pay

## 2018-05-20 NOTE — Patient Outreach (Signed)
  [facility]) Care Management Chronic Special Needs Program   05/20/2018  Name: Teree, Frankenberg: 1951/03/20  MRN: [id number]  The client was discussed in 05/16/18 interdisciplinary care team meeting.  The following issues were discussed: The client's individualized care plan was developed based on completed Health Risk Assessment  Key risk triggers/risk stratification, Care Plan and Issues/barriers to care    Participants present:   Jodean Lima, MSN, RN, CCM, CNS    Kathyrn Sheriff, MSN, RN, CCM   Heddy Vidana RN,BSN,CCM, CDE   Recommendations: Refer to pharmacy for medication cost issues Plan to send Advanced Directive packet and educational materials on HTN Plan:  Referral to pharmacy  Plan to send copy of individualized care plan to client Plan to send individualized care plan to provider. Chronic Care Management Coordinator will outreach in 1-2 months   Follow-up:  Plan to outreach client in one month Dudley Major RN, [facility], CDE Chronic Care Management Coordinator [facility] Care Management [phone]

## 2018-05-20 NOTE — Addendum Note (Signed)
Addended by: Yetta Glassman on: 05/20/2018 11:43 AM   Modules accepted: Orders

## 2018-05-21 ENCOUNTER — Telehealth: Payer: Self-pay | Admitting: *Deleted

## 2018-05-21 NOTE — Telephone Encounter (Signed)
LM to schedule AWV

## 2018-05-22 ENCOUNTER — Telehealth: Payer: Self-pay | Admitting: Pharmacist

## 2018-05-22 NOTE — Patient Outreach (Signed)
[facility]) Care Management  05/22/2018  KELBY BROADBENT [birth year] [id number]   Patient was called regarding medication assistance per referral. She is in the [facility]. Unfortunately she did not answer her phone. HIPAA compliant message was left on the number listed as her mobile number on her chart. The number listed as a home phone was disconnected.  Plan: Send patient an unsuccessful contact letter. Call patient back in 3-5 business days.   Beecher Mcardle, PharmD, BCACP [facility] Clinical Pharmacist [phone]

## 2018-05-27 ENCOUNTER — Other Ambulatory Visit: Payer: Self-pay | Admitting: Pharmacist

## 2018-05-27 NOTE — Patient Outreach (Addendum)
[facility]) Care Management  [place]   05/27/2018  Jeanette Ellis [birth year] [id number]  Reason for referral: Medication Management & potential help with med costs after she reaches the donut hole.  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  HIPAA identifiers verified.   Subjective:  Patient is a 68 year old female with the above mentioned conditions. She is part the the CSNP program with [employer]   Objective: Lab Results  Component Value Date   CREATININE 1.20 (H) 04/11/2018   CREATININE 0.87 02/05/2018   CREATININE 0.95 01/13/2018    Lab Results  Component Value Date   HGBA1C 8.3 (A) 04/15/2018    Lipid Panel     Component Value Date/Time   CHOL 143 01/13/2018 1444   TRIG 59 01/13/2018 1444   HDL 60 01/13/2018 1444   CHOLHDL 2.4 01/13/2018 1444   LDLCALC 71 01/13/2018 1444    BP Readings from Last 3 Encounters:  04/21/18 132/72  04/15/18 (!) 142/88  02/10/18 134/72    No Known Allergies  Medications Reviewed Today    Reviewed by Elayne Guerin, [place] (Pharmacist) on 05/27/18 at [phone]  Med List Status: <None>  Medication Order Taking? Sig Documenting Provider Last Dose Status Informant  albuterol (PROVENTIL HFA;VENTOLIN HFA) 108 (90 Base) MCG/ACT inhaler [id number] Yes Inhale 2 puffs into the lungs every 4 (four) hours as needed for wheezing or shortness of breath (cough, shortness of breath or wheezing.). Tenna Delaine D, PA-C Taking Active   aspirin EC 81 MG tablet [id number] No Take 81 mg by mouth. [provider] Not Taking Active   atorvastatin (LIPITOR) 20 MG tablet [id number] Yes TAKE 1 TABLET BY MOUTH ONCE DAILY Rutherford Guys, MD Taking Active   Continuous Blood Gluc Receiver  (FREESTYLE LIBRE 14 DAY READER) DEVI [id number] Yes 1 Device by Does not apply route daily as needed. Tenna Delaine D, PA-C Taking Active   Continuous Blood Gluc Sensor (FREESTYLE LIBRE 14 DAY SENSOR) [place] [id number] Yes USE AS DIRECTED Forrest Moron, MD Taking Active   Dulaglutide (TRULICITY) 6.33 HL/4.5GY SOPN [id number] Yes Inject 0.75 mg into the skin once a week. Elayne Snare, MD Taking Active   fluticasone furoate-vilanterol (BREO ELLIPTA) 200-25 MCG/INH AEPB [id number] Yes Inhale 1 puff into the lungs daily. Tenna Delaine D, PA-C Taking Active   lisinopril (PRINIVIL,ZESTRIL) 10 MG tablet [id number] Yes TAKE 1 TABLET BY MOUTH ONCE DAILY Rutherford Guys, MD Taking Active   metFORMIN (GLUCOPHAGE-XR) 500 MG 24 hr tablet [id number] Yes TAKE 2 TABLETS BY MOUTH TWICE DAILY WITH MEALS Timmothy Euler [place] D, PA-C Taking Active   Multiple Vitamin (MULTIVITAMIN WITH MINERALS) TABS tablet [id number] Yes Take 1 tablet by mouth daily. [provider] Taking Active   TOUJEO SOLOSTAR 300 UNIT/ML [facility] [id number] Yes INJECT [place] Rutherford Guys, MD Taking Active            Med Note Elayne Guerin   Tue May 27, 2018  9:36 AM) Patient reported using between 20-25 units daily          Assessment:  Drugs sorted by system:  Neurologic/Psychologic:  Cardiovascular: Atorvastatin, Aspirin (not taking), lisinopril  Pulmonary/Allergy: Albuterol HFA, Breo   Endocrine: Trulicity, Toujeo, Metformin  Vitamins/Minerals/Supplements: Ferous sulfate, Multiple Vitamin   Medication  Review Findings:   Adherence -Breo-patient has not had this filled since 02/04/18 for a 30 day supply (due to cost). Under her current benefit, [facility] would have a $45 copay for 1 month supply or $90 for a 90 day supply.  To help with copay costs and to decrease in the expenditure towards the "donut hole" patient could be switched to [facility] instead of Breo (if deemed therapeutically  appropriated).  Both Breo and Dulera have patient assistance programs that provide them at no cost but the [employer]) requires patient's to spend at least $600 in out-of-pocket medication expenses to qualify.  Patient has not met this requirement.  Ruthe Mannan can be obtained from [employer] and their program does not require an out-of-pocket expenditure.  -Aspirn-patient reported she hast not been taking her aspirin in the last few weeks.     Medication Assistance Findings:  Medication assistance needs identified.   Extra Help:   _0  Already receiving Full Extra Help  _1  Already receiving Partial Extra Help  _2  Eligible based on reported income and assets  _3  Not Eligible based on reported income and assets  Patient Assistance Programs: 1) Museum/gallery curator (subbed for [employer] approved) made by [employer] o Income requirement met: _4  Yes _5  No _6  Unknown o Out-of-pocket prescription expenditure met:    _7  Yes _8  No  _9  Unknown  <GVSYVGCYOYOOJZBF>_0<\/ZUAUEBVPLWUZRVUF>_41  Not applicable - Patient has met application requirements to apply for this patient assistance program.     Nelva Nay is provided by [employer] program but they require patients to spend 2% of their income in medication expense to qualify.         2)  Dulera (subbed for Breo if approved) made by [employer] o Income requirement met: _11  Yes _12  No  _13  Unknown o Out-of-pocket prescription expenditure met:   _14  Yes _15  No   _16  Unknown <GQHQIXMDEKIYJGZQ>_9<\/SIDXFPKGYBNLWHKN>_18  Not applicable - Patient has met application requirements to apply for this patient assistance program.    Additional medication assistance options reviewed with patient as warranted:  No other options identified  Plan: I will route patient assistance letter to [place] technician who will coordinate patient assistance program application process for medications listed above.  [facility] pharmacy technician will assist with obtaining all required documents from both patient and provider(s) and submit application(s) once completed.  , Will contact Dr. Nolon Rod and Dr. Dwyane Dee regarding therapeutic substitutions. , Will follow-up in 2 weeks

## 2018-05-28 ENCOUNTER — Other Ambulatory Visit: Payer: Self-pay | Admitting: Pharmacy Technician

## 2018-05-28 NOTE — Patient Outreach (Signed)
[facility]) Care Management  05/28/2018  SAESHA EGGE Nov 20, 1950 [id number]                                                   Medication Assistance Referral  Referral From: [facility] RPh Katina B.  Medication/Company: Trudee Kuster / Lilly Patient application portion:  Mailed Provider application portion: Faxed  to Dr Reather Littler  Medication/Company: [name] / Julious Oka Patient application portion:  Mailed Provider application portion: Faxed  to Dr Reather Littler  Medication/Company: Elwin Sleight / [employer] Patient application portion:  Mailed Provider application portion: Interoffice Mailed to Dr. Collie Siad   Follow up:  Will follow up with patient in 5-7 business days to confirm application(s) have been received.  Tayshawn Purnell P. [place], CPhT Musician Care Management [phone]

## 2018-05-30 ENCOUNTER — Other Ambulatory Visit: Payer: Self-pay | Admitting: Family Medicine

## 2018-06-02 ENCOUNTER — Telehealth: Payer: Self-pay | Admitting: Pharmacist

## 2018-06-02 ENCOUNTER — Telehealth: Payer: Self-pay | Admitting: Endocrinology

## 2018-06-02 NOTE — Telephone Encounter (Signed)
-----  Message from Elayne Snare, MD sent at 06/01/2018  9:10 PM EST ----- Patient missed her last appointment and has not made any follow-up, will need to have her be seen regularly to sign prescriptions ----- Message ----- From: Elayne Guerin, [facility] Sent: 05/27/2018  10:38 AM EST To: Elayne Snare, MD  Dr. Dwyane Dee,  Please see the attached [facility] Pharmacist's note. Patient was referred for medication review and because she has concerns about medication costs specifically Toujeo and Trulicity.  Toujeo is available from [employer] Patient Assistance program but requires patients to spend at least 2% of their annual income in medication expenses. (Ms. Singleterry has not met this requirement).  Orson Ape has eased their requirements this year. Both Trulicity and Engineer, agricultural are available from the [employer]. If deemed therapeutically appropriate, Basaglar could be subbed for Toujeo to help the patient with medication costs. If this is agreeable, please sign, complete and fax back to [facility] the application form that will be sent to your office. [facility] will handle the correspondence with the program.  Thank you so much for your time and consideration.  Blessings,  Elayne Guerin, PharmD, [place] Clinical Pharmacist [phone]

## 2018-06-02 NOTE — Patient Outreach (Addendum)
[facility]) Care Management  06/02/2018  Jeanette Ellis 08/18/1950 [id number]   Patient was called regarding missing her appointment with Dr. Lucianne Muss.  Dr. Lucianne Muss said he would need to see the patient before he would complete forms for her.   HIPAA identifiers were obtained. Patient said she would prefer [place] to send her forms to Dr. Creta Levin.  Plan: Send note to Dr. Creta Levin to see if she is ok with the forms being faxed to her.  Follow up in 5-7 business days.  Beecher Mcardle, PharmD, BCACP [facility] Clinical Pharmacist [phone]

## 2018-06-02 NOTE — Telephone Encounter (Signed)
Attempted to call patient to reschedule, no answer on phone, and then recording regarding not accepting phone messages

## 2018-06-03 ENCOUNTER — Other Ambulatory Visit: Payer: Self-pay | Admitting: Family Medicine

## 2018-06-03 ENCOUNTER — Other Ambulatory Visit: Payer: Self-pay | Admitting: Physician Assistant

## 2018-06-03 DIAGNOSIS — E119 Type 2 diabetes mellitus without complications: Secondary | ICD-10-CM

## 2018-06-03 DIAGNOSIS — Z794 Long term (current) use of insulin: Principal | ICD-10-CM

## 2018-06-04 NOTE — Telephone Encounter (Signed)
Requested Prescriptions  Pending Prescriptions Disp Refills  . TOUJEO SOLOSTAR 300 UNIT/ML SOPN [Pharmacy Med Name: Toujeo SoloStar 300 UNIT/ML Subcutaneous Solution Pen-injector] 6 mL 0    Sig: INJECT 25 UNITS SUBCUTANEOUSLY ONCE DAILY     Endocrinology:  Diabetes - Insulins Failed - 06/03/2018  9:47 PM      Failed - HBA1C is between 0 and 7.9 and within 180 days    Hemoglobin A1C  Date Value Ref Range Status  04/15/2018 8.3 (A) 4.0 - 5.6 % Final         Passed - Valid encounter within last 6 months    Recent Outpatient Visits          1 month ago Type 2 diabetes mellitus without complication, with long-term current use of insulin (HCC)   Primary Care at [facility], [name], MD   4 months ago Type 2 diabetes mellitus without complication, with long-term current use of insulin (HCC)   Primary Care at [place], [place] D, PA-C   7 months ago Bronchitis with bronchospasm   Primary Care at [place], [place] D, PA-C   7 months ago Bronchitis with bronchospasm   Primary Care at [place], [place] D, PA-C   10 months ago Type 2 diabetes mellitus without complication, with long-term current use of insulin [facility])   Primary Care at [facility], Gerald Stabs, PA-C      Future Appointments            In 1 month Chilton Greathouse, MD [facility]   In 1 month [name], MD Primary Care at [place], [facility]

## 2018-06-04 NOTE — Telephone Encounter (Signed)
Requested Prescriptions  Pending Prescriptions Disp Refills  . metFORMIN (GLUCOPHAGE-XR) 500 MG 24 hr tablet [Pharmacy Med Name: metFORMIN HCl ER 500 MG Oral Tablet Extended Release 24 Hour] 360 tablet 0    Sig: TAKE 2 TABLETS BY MOUTH TWICE DAILY WITH MEALS     Endocrinology:  Diabetes - Biguanides Failed - 06/03/2018  9:47 PM      Failed - Cr in normal range and within 360 days    Creatinine  Date Value Ref Range Status  02/05/2018 0.87 0.44 - 1.00 mg/dL Final   Creat  Date Value Ref Range Status  03/11/2017 0.7 0.6 - 1.2 mg/dl Final   Creatinine, Ser  Date Value Ref Range Status  04/11/2018 1.20 (H) 0.57 - 1.00 mg/dL Final         Failed - HBA1C is between 0 and 7.9 and within 180 days    Hemoglobin A1C  Date Value Ref Range Status  04/15/2018 8.3 (A) 4.0 - 5.6 % Final         Failed - eGFR in normal range and within 360 days    GFR, Est AFR Am  Date Value Ref Range Status  02/05/2018 >60 >60 mL/min Final    Comment:    (NOTE) The eGFR has been calculated using the CKD EPI equation. This calculation has not been validated in all clinical situations. eGFR's persistently <60 mL/min signify possible Chronic Kidney Disease.    GFR calc Af Amer  Date Value Ref Range Status  04/11/2018 53 (L) >59 mL/min/1.73 Final   GFR, Est Non Af Am  Date Value Ref Range Status  02/05/2018 >60 >60 mL/min Final   GFR calc non Af Amer  Date Value Ref Range Status  04/11/2018 46 (L) >59 mL/min/1.73 Final         Passed - Valid encounter within last 6 months    Recent Outpatient Visits          1 month ago Type 2 diabetes mellitus without complication, with long-term current use of insulin ([place])   Primary Care at [place], MD   4 months ago Type 2 diabetes mellitus without complication, with long-term current use of insulin [facility])   Primary Care at [place], [place] D, PA-C   7 months ago Bronchitis with bronchospasm   Primary Care at [place], [place]  D, PA-C   7 months ago Bronchitis with bronchospasm   Primary Care at [place], [place] D, PA-C   10 months ago Type 2 diabetes mellitus without complication, with long-term current use of insulin [facility])   Primary Care at [facility], Reather Laurence, PA-C      Future Appointments            In 1 month Marshell Garfinkel, MD [place] [facility]   In 1 month Forrest Moron, MD Primary Care at [place], [facility]

## 2018-06-10 ENCOUNTER — Other Ambulatory Visit: Payer: Self-pay | Admitting: Pharmacist

## 2018-06-10 ENCOUNTER — Ambulatory Visit: Payer: Self-pay | Admitting: Pharmacist

## 2018-06-10 NOTE — Patient Outreach (Signed)
[facility]) Care Management  06/10/2018  TANVEER BERGSTRAND 1950/04/21 [id number]   Called patient regarding medication assistance. We have not received the patient assistance forms back that were sent to her.   Plan: Call patient back in 7-14 business days. Unsuccessful contact letter was sent on 05/22/18  Beecher Mcardle, PharmD, [facility] Clinical Pharmacist [phone]

## 2018-06-16 ENCOUNTER — Other Ambulatory Visit: Payer: Self-pay | Admitting: Pharmacy Technician

## 2018-06-16 NOTE — Patient Outreach (Signed)
[facility]) Care Management  06/16/2018  Jeanette Ellis 02/20/1951 [id number]    Successful outreach call placed to patient in regards to her [employer] application for Trulicity and Hospital doctor and her Energy manager for [employer].  Spoke to patient, HIPAA identifiers verified. [name] informed that she had received the applications but had not mailed them back yet. She informed that she would mail them back this week.  Will followup with patient in 7-10 business days to inquire if she indeed mailed back the applications as she stated above.  Cadee Agro P. Jacquita Mulhearn, CPhT Musician Care Management [phone]

## 2018-06-18 ENCOUNTER — Ambulatory Visit: Payer: Self-pay

## 2018-06-27 ENCOUNTER — Ambulatory Visit: Payer: Self-pay | Admitting: Pharmacist

## 2018-06-30 ENCOUNTER — Other Ambulatory Visit: Payer: Self-pay | Admitting: Pharmacy Technician

## 2018-06-30 NOTE — Patient Outreach (Signed)
[facility]) Care Management  06/30/2018  Jeanette Ellis [birth year] [id number]  Successful outreach call placed to patient in regards to [employer] application for Trulicity and Insurance risk surveyor for [employer]. This makes the 2nd outreach attempt in confirming patient's plan to mail back completed applications.  Spoke to patient, HIPAA identifiers verified. Inquired if patient had mailed back her application as stated in 06/16/2018 conversation. Patient informed that she had not mailed them back but would make it a top priority and place them in the mail.  Will followup with patient in 10-14 business days to inquired if applications were mailed back.  Ruchel Brandenburger P. Keyontae Huckeby, CPhT Musician Care Management [phone]

## 2018-07-07 ENCOUNTER — Ambulatory Visit (INDEPENDENT_AMBULATORY_CARE_PROVIDER_SITE_OTHER): Payer: HMO | Admitting: Family Medicine

## 2018-07-07 ENCOUNTER — Other Ambulatory Visit: Payer: Self-pay

## 2018-07-07 ENCOUNTER — Telehealth: Payer: Self-pay | Admitting: *Deleted

## 2018-07-07 VITALS — Ht 67.0 in | Wt 195.0 lb

## 2018-07-07 DIAGNOSIS — Z0000 Encounter for general adult medical examination without abnormal findings: Secondary | ICD-10-CM

## 2018-07-07 NOTE — Progress Notes (Addendum)
Presents today for [employer] Visit   Date of last exam:   Interpreter used for this visit? yes  Patient Care Team: [name], MD as PCP - General (Internal Medicine) Patient, No Pcp Per (General Practice) Yetta Glassman, RN as [employer] [employer] Care Management Leavy Cella, Romilda Joy, [facility] as [facility] Therapist, music (Pharmacist) Simcox, Stacie Acres, CPhT as [facility] Doctor, hospital)     Immunization status:  Immunization History  Administered Date(s) Administered  . Influenza, High Dose Seasonal PF 01/13/2018  . Influenza,inj,Quad PF,6+ Mos 05/11/2016  . Pneumococcal Conjugate-13 06/07/2014  . Pneumococcal Polysaccharide-23 09/08/2015  . Td 04/21/2018     Health Maintenance Due  Topic Date Due  . Hepatitis C Screening  [birth year]     Functional Status Survey: Is the patient deaf or have difficulty hearing?: No Does the patient have difficulty seeing, even when wearing glasses/contacts?: No Does the patient have difficulty concentrating, remembering, or making decisions?: No Does the patient have difficulty walking or climbing stairs?: No Does the patient have difficulty dressing or bathing?: No Does the patient have difficulty doing errands alone such as visiting a doctor's office or shopping?: No   6CIT Screen 07/07/2018  What Year? 0 points  What month? 0 points  What time? 0 points  Count back from 20 0 points  Months in reverse 0 points  Repeat phrase 0 points  Total Score 0        Office Visit from 07/07/2018 in [facility] at [facility]  AUDIT-C Score  0         Patient Active Problem List   Diagnosis Date Noted  . Microalbuminuria due to type 2 diabetes mellitus (HCC) 09/19/2017  . Sarcoidosis 09/06/2017  . Diabetic retinopathy (HCC) 08/21/2017  . Iron deficiency anemia 03/11/2017  . Anemia associated with stage 3 chronic renal failure (HCC) 03/11/2017  . History of  sarcoidosis 06/09/2015  . Type 2 diabetes mellitus without complication, with long-term current use of insulin (HCC) 01/12/2014  . Essential hypertension 09/21/2013  . Hyperlipemia 09/21/2013     Past Medical History:  Diagnosis Date  . Anemia associated with stage 3 chronic renal failure (HCC) 03/11/2017  . Diabetes mellitus without complication (HCC)   . High cholesterol   . Hypertension   . Iron deficiency anemia 03/11/2017  . Sarcoidosis      Past Surgical History:  Procedure Laterality Date  . ABDOMINAL HYSTERECTOMY       Family History  Problem Relation Age of Onset  . High blood pressure Mother   . Diabetes Father      Social History   Socioeconomic History  . Marital status: Married    Spouse name: Zynzulu  . Number of children: 4  . Years of education: Not on file  . Highest education level: Not on file  Occupational History  . Not on file  Social Needs  . Financial resource strain: Not on file  . Food insecurity:    Worry: Never true    Inability: Never true  . Transportation needs:    Medical: No    Non-medical: No  Tobacco Use  . Smoking status: Never Smoker  . Smokeless tobacco: Never Used  Substance and Sexual Activity  . Alcohol use: No  . Drug use: No  . Sexual activity: Not Currently  Lifestyle  . Physical activity:    Days per week: Not on file    Minutes per session:  Not on file  . Stress: Not on file  Relationships  . Social connections:    Talks on phone: Not on file    Gets together: Not on file    Attends religious service: Not on file    Active member of club or organization: Not on file    Attends meetings of clubs or organizations: Not on file    Relationship status: Not on file  . Intimate partner violence:    Fear of current or ex partner: Not on file    Emotionally abused: Not on file    Physically abused: Not on file    Forced sexual activity: Not on file  Other Topics Concern  . Not on file  Social History  Narrative  . Not on file     No Known Allergies   Prior to Admission medications   Medication Sig Start Date End Date Taking? Authorizing Provider  albuterol (PROVENTIL HFA;VENTOLIN HFA) 108 (90 Base) MCG/ACT inhaler Inhale 2 puffs into the lungs every 4 (four) hours as needed for wheezing or shortness of breath (cough, shortness of breath or wheezing.). 10/28/17  Yes Barnett Abu, [place] D, PA-C  atorvastatin (LIPITOR) 20 MG tablet TAKE 1 TABLET BY MOUTH ONCE DAILY 04/14/18  Yes Myles Lipps, MD  Continuous Blood Gluc Receiver (FREESTYLE LIBRE 14 DAY READER) DEVI 1 Device by Does not apply route daily as needed. 10/28/17  Yes [name], PA-C  ferrous sulfate 325 (65 FE) MG EC tablet Take 325 mg by mouth daily.   Yes [provider]  fluticasone furoate-vilanterol (BREO ELLIPTA) 200-25 MCG/INH AEPB Inhale 1 puff into the lungs daily. 01/13/18  Yes Barnett Abu, [place] D, PA-C  metFORMIN (GLUCOPHAGE-XR) 500 MG 24 hr tablet TAKE 2 TABLETS BY MOUTH TWICE DAILY WITH MEALS 06/04/18  Yes [name], MD  Multiple Vitamin (MULTIVITAMIN WITH MINERALS) TABS tablet Take 1 tablet by mouth daily.   Yes [provider]  aspirin EC 81 MG tablet Take 81 mg by mouth.    [provider]  Continuous Blood Gluc Sensor (FREESTYLE LIBRE 14 DAY SENSOR) MISC USE AS DIRECTED 08/01/18   Collie Siad A, MD  fluticasone furoate-vilanterol (BREO ELLIPTA) 200-25 MCG/INH AEPB Inhale 1 puff into the lungs daily. 08/06/18   Glenford Bayley, NP  Insulin Glargine, 1 Unit Dial, (TOUJEO SOLOSTAR) 300 UNIT/ML SOPN Inject 20 Units into the skin daily. 07/28/18   [name], MD  lisinopril (ZESTRIL) 20 MG tablet Take 1 tablet (20 mg total) by mouth daily. 07/29/18   [name], MD     Depression screen [facility] 2/9 07/28/2018 07/23/2018 07/07/2018 04/21/2018 02/03/2018  Decreased Interest 0 0 0 0 0  Down, Depressed, Hopeless 0 0 0 0 0  PHQ - 2 Score 0 0 0 0 0     Fall Risk  07/28/2018  07/07/2018 04/21/2018 02/03/2018 01/13/2018  Falls in the past year? 0 0 0 No No  Number falls in past yr: 0 0 - - -  Injury with Fall? 0 - - - -  Follow up Falls evaluation completed - - - -      PHYSICAL EXAM: Ht  (1.702 m)   Wt 195 lb (88.5 kg)   BMI 30.54 kg/m    Wt Readings from Last 3 Encounters:  07/28/18 195 lb (88.5 kg)  07/07/18 195 lb (88.5 kg)  04/21/18 195 lb 12.8 oz (88.8 kg)     No exam data present    Physical  Exam   Education/Counseling provided regarding diet and exercise, prevention of chronic diseases, smoking/tobacco cessation, if applicable, and reviewed "Covered Medicare Preventive Services."   ASSESSMENT/PLAN: 1. Medicare annual wellness visit, subsequent    Dr. London SheerPeter Dunn -Opthamologist.  Up to date on all immunizations  Has free style Josephine Igolibre says she is feeling much better Due for diabetic check in 10-2018  Lives in two story house with spouse and nephew  No trouble climbing stairs  No trouble getting out of chair   No scattered rugs No grab bars House is well lit.  Patient walks 4 days a week Member at [employer] does not use as right now.  No advanced directive will mail to patient.   Breakfast : grits/saugsage/bacon/cereal Lunch: sandwich Dinner variety of things  Message sent to schedule a follow up for patient back pain not sleeping well was seen 04/2018 for this issue.     Presents today for [employer] Annual Wellness Visit      Patient Care Team: Doristine BosworthStallings, [name], MD as PCP - General (Internal Medicine) Patient, No Pcp Per (General Practice) Yetta GlassmanSandlin, Melissa J, RN as [facility] Care Management Beecher McardleBoyd, Katina J, [facility] StationRPH as [facility] Care Management (Pharmacist) Simcox, Stacie AcresJill P, CPhT as [facility], [facility] Care Management (Pharmacy Technician)    Other Screening: Last screening for diabetes: 04/21/2018   ADVANCE DIRECTIVES: Discussed: yes  Materials Provided:  Mailed to  patient  Immunization status:  Immunization History  Administered Date(s) Administered  . Influenza, High Dose Seasonal PF 01/13/2018  . Influenza,inj,Quad PF,6+ Mos 05/11/2016  . Pneumococcal Conjugate-13 06/07/2014  . Pneumococcal Polysaccharide-23 09/08/2015  . Td 04/21/2018     Health Maintenance Due  Topic Date Due  . Hepatitis C Screening  17-Dec-1950     Functional Status Survey: Is the patient deaf or have difficulty hearing?: No Does the patient have difficulty seeing, even when wearing glasses/contacts?: No Does the patient have difficulty concentrating, remembering, or making decisions?: No Does the patient have difficulty walking or climbing stairs?: No Does the patient have difficulty dressing or bathing?: No Does the patient have difficulty doing errands alone such as visiting a doctor's office or shopping?: No   6CIT Screen 07/07/2018  What Year? 0 points  What month? 0 points  What time? 0 points  Count back from 20 0 points  Months in reverse 0 points  Repeat phrase 0 points  Total Score 0        Patient Active Problem List   Diagnosis Date Noted  . Microalbuminuria due to type 2 diabetes mellitus (HCC) 09/19/2017  . Sarcoidosis 09/06/2017  . Diabetic retinopathy (HCC) 08/21/2017  . Iron deficiency anemia 03/11/2017  . Anemia associated with stage 3 chronic renal failure (HCC) 03/11/2017  . History of sarcoidosis 06/09/2015  . Type 2 diabetes mellitus without complication, with long-term current use of insulin (HCC) 01/12/2014  . Essential hypertension 09/21/2013  . Hyperlipemia 09/21/2013     Past Medical History:  Diagnosis Date  . Anemia associated with stage 3 chronic renal failure (HCC) 03/11/2017  . Diabetes mellitus without complication (HCC)   . High cholesterol   . Hypertension   . Iron deficiency anemia 03/11/2017  . Sarcoidosis      Past Surgical History:  Procedure Laterality Date  . ABDOMINAL HYSTERECTOMY       Family  History  Problem Relation Age of Onset  . High blood pressure Mother   . Diabetes Father      Social  History   Socioeconomic History  . Marital status: Married    Spouse name: Zynzulu  . Number of children: 4  . Years of education: Not on file  . Highest education level: Not on file  Occupational History  . Not on file  Social Needs  . Financial resource strain: Not on file  . Food insecurity:    Worry: Never true    Inability: Never true  . Transportation needs:    Medical: No    Non-medical: No  Tobacco Use  . Smoking status: Never Smoker  . Smokeless tobacco: Never Used  Substance and Sexual Activity  . Alcohol use: No  . Drug use: No  . Sexual activity: Not Currently  Lifestyle  . Physical activity:    Days per week: Not on file    Minutes per session: Not on file  . Stress: Not on file  Relationships  . Social connections:    Talks on phone: Not on file    Gets together: Not on file    Attends religious service: Not on file    Active member of club or organization: Not on file    Attends meetings of clubs or organizations: Not on file    Relationship status: Not on file  . Intimate partner violence:    Fear of current or ex partner: Not on file    Emotionally abused: Not on file    Physically abused: Not on file    Forced sexual activity: Not on file  Other Topics Concern  . Not on file  Social History Narrative  . Not on file     No Known Allergies   Prior to Admission medications   Medication Sig Start Date End Date Taking? Authorizing Provider  albuterol (PROVENTIL HFA;VENTOLIN HFA) 108 (90 Base) MCG/ACT inhaler Inhale 2 puffs into the lungs every 4 (four) hours as needed for wheezing or shortness of breath (cough, shortness of breath or wheezing.). 10/28/17  Yes Barnett Abu, [place] D, PA-C  atorvastatin (LIPITOR) 20 MG tablet TAKE 1 TABLET BY MOUTH ONCE DAILY 04/14/18  Yes Myles Lipps, MD  Continuous Blood Gluc Receiver (FREESTYLE LIBRE 14 DAY  READER) DEVI 1 Device by Does not apply route daily as needed. 10/28/17  Yes Barnett Abu, [place] D, PA-C  Continuous Blood Gluc Sensor (FREESTYLE LIBRE 14 DAY SENSOR) MISC USE AS DIRECTED 05/15/18  Yes [name], [name], MD  Dulaglutide (TRULICITY) 0.75 MG/0.5ML SOPN Inject 0.75 mg into the skin once a week. 04/15/18  Yes Reather Littler, MD  ferrous sulfate 325 (65 FE) MG EC tablet Take 325 mg by mouth daily.   Yes [provider]  fluticasone furoate-vilanterol (BREO ELLIPTA) 200-25 MCG/INH AEPB Inhale 1 puff into the lungs daily. 01/13/18  Yes Barnett Abu, [place] D, PA-C  lisinopril (PRINIVIL,ZESTRIL) 10 MG tablet TAKE 1 TABLET BY MOUTH ONCE DAILY 04/14/18  Yes Myles Lipps, MD  metFORMIN (GLUCOPHAGE-XR) 500 MG 24 hr tablet TAKE 2 TABLETS BY MOUTH TWICE DAILY WITH MEALS 06/04/18  Yes [name], MD  Multiple Vitamin (MULTIVITAMIN WITH MINERALS) TABS tablet Take 1 tablet by mouth daily.   Yes [provider]  TOUJEO SOLOSTAR 300 UNIT/ML SOPN INJECT 25 UNITS SUBCUTANEOUSLY ONCE DAILY 06/04/18  Yes Collie Siad A, MD  aspirin EC 81 MG tablet Take 81 mg by mouth.    [provider]     Depression screen [facility] 2/9 07/07/2018 04/21/2018 02/03/2018 01/13/2018 10/30/2017  Decreased Interest 0 0 0 0 0  Down, Depressed,  Hopeless 0 0 0 0 0  PHQ - 2 Score 0 0 0 0 0     Fall Risk  07/07/2018 04/21/2018 02/03/2018 01/13/2018 10/30/2017  Falls in the past year? 0 0 No No No  Number falls in past yr: 0 - - - -      PHYSICAL EXAM: Ht  (1.702 m)   Wt 195 lb (88.5 kg)   BMI 30.54 kg/m    Wt Readings from Last 3 Encounters:  07/07/18 195 lb (88.5 kg)  04/21/18 195 lb 12.8 oz (88.8 kg)  04/15/18 199 lb (90.3 kg)

## 2018-07-07 NOTE — Patient Instructions (Addendum)
Thank you for taking time to come for your Medicare Wellness Visit. I appreciate your ongoing commitment to your health goals. Please review the following plan we discussed and let me know if I can assist you in the future.  Grabiela Wohlford LPN   Diabetes Mellitus and Nutrition, Adult When you have diabetes (diabetes mellitus), it is very important to have healthy eating habits because your blood sugar (glucose) levels are greatly affected by what you eat and drink. Eating healthy foods in the appropriate amounts, at about the same times every day, can help you:  Control your blood glucose.  Lower your risk of heart disease.  Improve your blood pressure.  Reach or maintain a healthy weight. Every person with diabetes is different, and each person has different needs for a meal plan. Your health care provider may recommend that you work with a diet and nutrition specialist (dietitian) to make a meal plan that is best for you. Your meal plan may vary depending on factors such as:  The calories you need.  The medicines you take.  Your weight.  Your blood glucose, blood pressure, and cholesterol levels.  Your activity level.  Other health conditions you have, such as heart or kidney disease. How do carbohydrates affect me? Carbohydrates, also called carbs, affect your blood glucose level more than any other type of food. Eating carbs naturally raises the amount of glucose in your blood. Carb counting is a method for keeping track of how many carbs you eat. Counting carbs is important to keep your blood glucose at a healthy level, especially if you use insulin or take certain oral diabetes medicines. It is important to know how many carbs you can safely have in each meal. This is different for every person. Your dietitian can help you calculate how many carbs you should have at each meal and for each snack. Foods that contain carbs include:  Bread, cereal, rice, pasta, and crackers.   Potatoes and corn.  Peas, beans, and lentils.  Milk and yogurt.  Fruit and juice.  Desserts, such as cakes, cookies, ice cream, and candy. How does alcohol affect me? Alcohol can cause a sudden decrease in blood glucose (hypoglycemia), especially if you use insulin or take certain oral diabetes medicines. Hypoglycemia can be a life-threatening condition. Symptoms of hypoglycemia (sleepiness, dizziness, and confusion) are similar to symptoms of having too much alcohol. If your health care provider says that alcohol is safe for you, follow these guidelines:  Limit alcohol intake to no more than 1 drink per day for nonpregnant women and 2 drinks per day for men. One drink equals 12 oz of beer, 5 oz of wine, or 1 oz of hard liquor.  Do not drink on an empty stomach.  Keep yourself hydrated with water, diet soda, or unsweetened iced tea.  Keep in mind that regular soda, juice, and other mixers may contain a lot of sugar and must be counted as carbs. What are tips for following this plan?  Reading food labels  Start by checking the serving size on the "Nutrition Facts" label of packaged foods and drinks. The amount of calories, carbs, fats, and other nutrients listed on the label is based on one serving of the item. Many items contain more than one serving per package.  Check the total grams (g) of carbs in one serving. You can calculate the number of servings of carbs in one serving by dividing the total carbs by 15. For example, if a food   has 30 g of total carbs, it would be equal to 2 servings of carbs.  Check the number of grams (g) of saturated and trans fats in one serving. Choose foods that have low or no amount of these fats.  Check the number of milligrams (mg) of salt (sodium) in one serving. Most people should limit total sodium intake to less than 2,300 mg per day.  Always check the nutrition information of foods labeled as "low-fat" or "nonfat". These foods may be higher in  added sugar or refined carbs and should be avoided.  Talk to your dietitian to identify your daily goals for nutrients listed on the label. Shopping  Avoid buying canned, premade, or processed foods. These foods tend to be high in fat, sodium, and added sugar.  Shop around the outside edge of the grocery store. This includes fresh fruits and vegetables, bulk grains, fresh meats, and fresh dairy. Cooking  Use low-heat cooking methods, such as baking, instead of high-heat cooking methods like deep frying.  Cook using healthy oils, such as olive, canola, or sunflower oil.  Avoid cooking with butter, cream, or high-fat meats. Meal planning  Eat meals and snacks regularly, preferably at the same times every day. Avoid going long periods of time without eating.  Eat foods high in fiber, such as fresh fruits, vegetables, beans, and whole grains. Talk to your dietitian about how many servings of carbs you can eat at each meal.  Eat 4-6 ounces (oz) of lean protein each day, such as lean meat, chicken, fish, eggs, or tofu. One oz of lean protein is equal to: ? 1 oz of meat, chicken, or fish. ? 1 egg. ?  cup of tofu.  Eat some foods each day that contain healthy fats, such as avocado, nuts, seeds, and fish. Lifestyle  Check your blood glucose regularly.  Exercise regularly as told by your health care provider. This may include: ? 150 minutes of moderate-intensity or vigorous-intensity exercise each week. This could be brisk walking, biking, or water aerobics. ? Stretching and doing strength exercises, such as yoga or weightlifting, at least 2 times a week.  Take medicines as told by your health care provider.  Do not use any products that contain nicotine or tobacco, such as cigarettes and e-cigarettes. If you need help quitting, ask your health care provider.  Work with a counselor or diabetes educator to identify strategies to manage stress and any emotional and social challenges.  Questions to ask a health care provider  Do I need to meet with a diabetes educator?  Do I need to meet with a dietitian?  What number can I call if I have questions?  When are the best times to check my blood glucose? Where to find more information:  American Diabetes Association: diabetes.org  Academy of Nutrition and Dietetics: www.eatright.org  National Institute of Diabetes and Digestive and Kidney Diseases (NIH): www.niddk.nih.gov Summary  A healthy meal plan will help you control your blood glucose and maintain a healthy lifestyle.  Working with a diet and nutrition specialist (dietitian) can help you make a meal plan that is best for you.  Keep in mind that carbohydrates (carbs) and alcohol have immediate effects on your blood glucose levels. It is important to count carbs and to use alcohol carefully. This information is not intended to replace advice given to you by your health care provider. Make sure you discuss any questions you have with your health care provider. Document Released: 12/21/2004 Document Revised: 10/24/2016   Document Reviewed: 04/30/2016 Elsevier Interactive Patient Education  2019 Elsevier Inc. Advance Directive  Advance directives are legal documents that let you make choices ahead of time about your health care and medical treatment in case you become unable to communicate for yourself. Advance directives are a way for you to communicate your wishes to family, friends, and health care providers. This can help convey your decisions about end-of-life care if you become unable to communicate. Discussing and writing advance directives should happen over time rather than all at once. Advance directives can be changed depending on your situation and what you want, even after you have signed the advance directives. If you do not have an advance directive, some states assign family decision makers to act on your behalf based on how closely you are related to  them. Each state has its own laws regarding advance directives. You may want to check with your health care provider, attorney, or state representative about the laws in your state. There are different types of advance directives, such as:  Medical power of attorney.  Living will.  Do not resuscitate (DNR) or do not attempt resuscitation (DNAR) order. Health care proxy and medical power of attorney A health care proxy, also called a health care agent, is a person who is appointed to make medical decisions for you in cases in which you are unable to make the decisions yourself. Generally, people choose someone they know well and trust to represent their preferences. Make sure to ask this person for an agreement to act as your proxy. A proxy may have to exercise judgment in the event of a medical decision for which your wishes are not known. A medical power of attorney is a legal document that names your health care proxy. Depending on the laws in your state, after the document is written, it may also need to be:  Signed.  Notarized.  Dated.  Copied.  Witnessed.  Incorporated into your medical record. You may also want to appoint someone to manage your financial affairs in a situation in which you are unable to do so. This is called a durable power of attorney for finances. It is a separate legal document from the durable power of attorney for health care. You may choose the same person or someone different from your health care proxy to act as your agent in financial matters. If you do not appoint a proxy, or if there is a concern that the proxy is not acting in your best interests, a court-appointed guardian may be designated to act on your behalf. Living will A living will is a set of instructions documenting your wishes about medical care when you cannot express them yourself. Health care providers should keep a copy of your living will in your medical record. You may want to give a copy  to family members or friends. To alert caregivers in case of an emergency, you can place a card in your wallet to let them know that you have a living will and where they can find it. A living will is used if you become:  Terminally ill.  Incapacitated.  Unable to communicate or make decisions. Items to consider in your living will include:  The use or non-use of life-sustaining equipment, such as dialysis machines and breathing machines (ventilators).  A DNR or DNAR order, which is the instruction not to use cardiopulmonary resuscitation (CPR) if breathing or heartbeat stops.  The use or non-use of tube feeding.  Withholding of food   and fluids.  Comfort (palliative) care when the goal becomes comfort rather than a cure.  Organ and tissue donation. A living will does not give instructions for distributing your money and property if you should pass away. It is recommended that you seek the advice of a lawyer when writing a will. Decisions about taxes, beneficiaries, and asset distribution will be legally binding. This process can relieve your family and friends of any concerns surrounding disputes or questions that may come up about the distribution of your assets. DNR or DNAR A DNR or DNAR order is a request not to have CPR in the event that your heart stops beating or you stop breathing. If a DNR or DNAR order has not been made and shared, a health care provider will try to help any patient whose heart has stopped or who has stopped breathing. If you plan to have surgery, talk with your health care provider about how your DNR or DNAR order will be followed if problems occur. Summary  Advance directives are the legal documents that allow you to make choices ahead of time about your health care and medical treatment in case you become unable to communicate for yourself.  The process of discussing and writing advance directives should happen over time. You can change the advance directives,  even after you have signed them.  Advance directives include DNR or DNAR orders, living wills, and designating an agent as your medical power of attorney. This information is not intended to replace advice given to you by your health care provider. Make sure you discuss any questions you have with your health care provider. Document Released: 07/03/2007 Document Revised: 02/13/2016 Document Reviewed: 02/13/2016 Elsevier Interactive Patient Education  2019 Elsevier Inc.  

## 2018-07-07 NOTE — Telephone Encounter (Signed)
Patient would like to schedule a telephone visit with Dr. Creta Levin regarding her back pain.

## 2018-07-08 ENCOUNTER — Telehealth: Payer: Self-pay | Admitting: Family Medicine

## 2018-07-08 ENCOUNTER — Telehealth: Payer: Self-pay | Admitting: Endocrinology

## 2018-07-08 ENCOUNTER — Other Ambulatory Visit: Payer: Self-pay | Admitting: Pharmacy Technician

## 2018-07-08 NOTE — Patient Outreach (Signed)
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  Spoke to [place] who took a message to send back to the provider. She informed patient was a no show in February and that might be the cause of the delay. Will await a return call.  Alexandru Moorer P. Renny Remer, CPhT Musician Care Management [phone]

## 2018-07-08 NOTE — Patient Outreach (Signed)
[facility] Care Management  07/08/2018  Jeanette Ellis 1950-04-18 [id number]    Care coordination call placed to Dr. Creta Levin office in regards to patient's medication assistance application for [facility] through [employer] patient assistance.  Left message with Elnita Maxwell to have the provider or the provider's nurse call me back as we inter officed/sent via courier the provider portion of the [employer] application on 05/28/2018 but we have yet to have it come back to [place].  [name] P. Oyindamola Key, CPhT Musician Care Management [phone]

## 2018-07-08 NOTE — Telephone Encounter (Signed)
Formed faxed to company and mailed to [place] at [facility] Management.

## 2018-07-08 NOTE — Telephone Encounter (Signed)
Copied from CRM [id number]. Topic: General - Other >> Jul 08, 2018  9:56 AM Percival Spanish wrote: Noreene Larsson with [facility] Management req a call back . Sent pt assissted application over to the office and she is calling to verify what happen to the paperwork if it was received

## 2018-07-08 NOTE — Telephone Encounter (Signed)
[facility] reached out in regards to assistance paperwork that was sent in February. Informed that there was nothing documented that patient No Showed on 05/14/2018. Please Advise with any other information.  Thanks

## 2018-07-08 NOTE — Patient Outreach (Signed)
[facility]) Care Management  07/08/2018  ADDALYNE AYOTTE Oct 25, 1950 [id number]    Incoming call received from [place] at Dr. Noberto Retort office.  Raoul Pitch called to confirm that she had received the fax and had faxed it into the company.  Informed Raoul Pitch that the form can not be faxed into [employer] as they do not accept faxed copies. Provided [place] with our address so that she could mail the application to [place].  Will submit application once it is received from the provider's office.  Deray Dawes P. Kourtlyn Charlet, CPhT Musician Care Management [phone]

## 2018-07-10 ENCOUNTER — Other Ambulatory Visit: Payer: Self-pay | Admitting: Pharmacist

## 2018-07-10 NOTE — Patient Outreach (Signed)
[facility]) Care Management  07/10/2018  CAMBRA ADWELL August 01, 1950 [id number]   Patient was called to inquire about patient assistance forms. HIPAA identifiers were obtained. Patient said she mailed the forms back yesterday.  She said she probably did miss her appointment with Dr. Lucianne Muss.   She was informed their office said they would not sign her forms until she was seen.  Patient communicated understanding.  Plan: Route note to [facility] for follow up. If we do not hear back from patient/provider in the next 45 days, close patient's case.   Beecher Mcardle, PharmD, BCACP [facility] Clinical Pharmacist [phone]

## 2018-07-11 ENCOUNTER — Ambulatory Visit: Payer: Self-pay | Admitting: Pharmacist

## 2018-07-21 ENCOUNTER — Ambulatory Visit: Payer: Self-pay | Admitting: Pulmonary Disease

## 2018-07-23 ENCOUNTER — Other Ambulatory Visit: Payer: Self-pay

## 2018-07-23 NOTE — Patient Outreach (Signed)
[facility]) Care Management Chronic Special Needs Program  07/23/2018  Name: Jeanette Ellis DOB: [birth year]  MRN: [id number]  Jeanette Ellis is enrolled in a chronic special needs plan for Diabetes. Chronic Care Management Coordinator telephoned client to review health risk assessment and to develop individualized care plan.  Introduced the chronic care management program, importance of client participation, and taking their care plan to all provider appointments and inpatient facilities.  Reviewed the transition of care process and possible referral to community care management.  Subjective: Client states that she needs to make an appt with her endocrinologist.  States she tries to scan her Jeanette Ellis sensor 2-3 times a day but she sometimes forgets.  States she has had a few low readings in the morning but ranges in the 90's most mornings and around 170 later in the day.  States she does not want Child psychotherapist at this time for Advanced Directives   Goals Addressed            This Visit's Progress   .  Acknowledge receipt of Advanced Directive package   On track   . Advanced Care Planning complete by 9 months   On track   . Client understands the importance of follow-up with providers by attending scheduled visits   On track   . Client will report abillity to obtain Medications within 3 months   On track    Continue to work with[employer] developer    . Client will use Assistive Devices as needed and verbalize understanding of device use   On track   . Client will verbalize knowledge of self management of Hypertension as evidences by BP reading of 140/90 or less; or as defined by provider   On track   . DIET - EAT MORE FRUITS AND VEGETABLES   On track    Increase walking     . HEMOGLOBIN A1C < 7.0       Continue Diabetes self management actions:  Glucose monitoring per provider recommendations  Perform Quality checks on blood meter   Eat Healthy  Check feet daily  Visit provider every 3-6 months as directed  Hbg A1C level every 3-6 months.  Eye Exam yearly    . Maintain timely refills of diabetic medication as prescribed within the year .   On track   . Obtain annual  Lipid Profile, LDL-C   On track   . Obtain Annual Eye (retinal)  Exam    On track   . Obtain Annual Foot Exam   On track   . Obtain annual screen for micro albuminuria (urine) , nephropathy (kidney problems)   On track   . Obtain Hemoglobin A1C at least 2 times per year   On track   . Visit Primary Care Provider or Endocrinologist at least 2 times per year    On track    Client is not meeting diabetes self management goal of hemoglobin A1C of <7% with last reading of 8.3%.  Currently working with pharmacy assistance for programs for her medications.  Declines referral to social worker for Advanced Directives but she does want the forms sent to her. Discussed COVID19 cause, symptoms, precautions (social distancing, stay at home order, hand washing), confirmed client knows how to contact provider. Plan:  Send successful outreach letter with a copy of their individualized care plan, Send individual care plan to provider and Send educational material Advanced Directives   Chronic care management coordination will outreach  in:  6 Months  Currently working with pharmacy assistance    Dudley MajorMelissa Sandlin RN, [facility] SouthBSN,CCM, CDE Chronic Care Management Coordinator [facility] Care Management [phone]

## 2018-07-28 ENCOUNTER — Ambulatory Visit (INDEPENDENT_AMBULATORY_CARE_PROVIDER_SITE_OTHER): Payer: HMO | Admitting: Family Medicine

## 2018-07-28 ENCOUNTER — Other Ambulatory Visit: Payer: Self-pay

## 2018-07-28 VITALS — BP 165/73 | HR 74 | Temp 98.0°F | Ht 67.0 in | Wt 195.0 lb

## 2018-07-28 DIAGNOSIS — R8290 Unspecified abnormal findings in urine: Secondary | ICD-10-CM | POA: Diagnosis not present

## 2018-07-28 DIAGNOSIS — M545 Low back pain, unspecified: Secondary | ICD-10-CM

## 2018-07-28 DIAGNOSIS — I10 Essential (primary) hypertension: Secondary | ICD-10-CM | POA: Diagnosis not present

## 2018-07-28 DIAGNOSIS — Z794 Long term (current) use of insulin: Secondary | ICD-10-CM | POA: Diagnosis not present

## 2018-07-28 DIAGNOSIS — E11649 Type 2 diabetes mellitus with hypoglycemia without coma: Secondary | ICD-10-CM

## 2018-07-28 DIAGNOSIS — E119 Type 2 diabetes mellitus without complications: Secondary | ICD-10-CM

## 2018-07-28 LAB — POCT URINALYSIS DIP (MANUAL ENTRY)
Bilirubin, UA: NEGATIVE
Glucose, UA: NEGATIVE mg/dL
Ketones, POC UA: NEGATIVE mg/dL
Nitrite, UA: NEGATIVE
Protein Ur, POC: NEGATIVE mg/dL
Spec Grav, UA: 1.015 (ref 1.010–1.025)
Urobilinogen, UA: 0.2 E.U./dL
pH, UA: 6 (ref 5.0–8.0)

## 2018-07-28 LAB — POCT GLYCOSYLATED HEMOGLOBIN (HGB A1C): Hemoglobin A1C: 6.8 % — AB (ref 4.0–5.6)

## 2018-07-28 MED ORDER — NITROFURANTOIN MONOHYD MACRO 100 MG PO CAPS: 100.0000 mg | ORAL_CAPSULE | Freq: Two times a day (BID) | ORAL | 0 refills | Status: AC

## 2018-07-28 MED ORDER — INSULIN GLARGINE (1 UNIT DIAL) 300 UNIT/ML ~~LOC~~ SOPN: 20.0000 [IU] | PEN_INJECTOR | Freq: Every day | SUBCUTANEOUS | 0 refills | Status: DC

## 2018-07-28 NOTE — Progress Notes (Signed)
Telemedicine Encounter- SOAP NOTE Established Patient  This telephone encounter was conducted with the patient's (or proxy's) verbal consent via audio telecommunications: yes/no: Yes Patient was instructed to have this encounter in a suitably private space; and to only have persons present to whom they give permission to participate. In addition, patient identity was confirmed by use of name plus two identifiers (DOB and address).  I discussed the limitations, risks, security and privacy concerns of performing an evaluation and management service by telephone and the availability of in person appointments. I also discussed with the patient that there may be a patient responsible charge related to this service. The patient expressed understanding and agreed to proceed.  I spent a total of TIME; 0 MIN TO 60 MIN: 25 minutes talking with the patient or their proxy.  Chief Complaint  Patient presents with  . Diabetes    f/u    Subjective   Jeanette Ellis is a 68 y.o. established patient. Telephone visit today for  HPI  Diabetes Mellitus:  Lab Results  Component Value Date   HGBA1C 6.8 (A) 07/28/2018   She report that in the 4am to 6am time frame she has hypoglycemia that was 40s and she has to eat something After getting up and moving around the numbers  It does not wake her from her sleep with diaphoresis, nausea or vomiting She takes her medications before bed sometimes  She eats dinner at 6pm typically and goes to sleep around 9 or 10pm She is taking toujeo and metformin She states that she has cut back on her metformin due to her hypoglycemia  Hypertension BP Readings from Last 3 Encounters:  07/28/18 (!) 165/73  04/21/18 132/72  04/15/18 (!) 142/88   Patient reports that she has been taking her bp med lisinopril '10mg'$   She denies dry cough or dizziness No palpitations or shortness of breath She avoids salty foods  Low back pain Pt reports nonradiating midline low  back pain  States that she has not uti symptoms without burning with urination, fevers or chills She states that it feels like a deep discomfort She denies injury She thinks it might be arthritis  Patient Active Problem List   Diagnosis Date Noted  . Microalbuminuria due to type 2 diabetes mellitus ([place]) 09/19/2017  . Sarcoidosis 09/06/2017  . Diabetic retinopathy ([place]) 08/21/2017  . Iron deficiency anemia 03/11/2017  . Anemia associated with stage 3 chronic renal failure ([place]) 03/11/2017  . History of sarcoidosis 06/09/2015  . Type 2 diabetes mellitus without complication, with long-term current use of insulin ([place]) 01/12/2014  . Essential hypertension 09/21/2013  . Hyperlipemia 09/21/2013    Past Medical History:  Diagnosis Date  . Anemia associated with stage 3 chronic renal failure ([place]) 03/11/2017  . Diabetes mellitus without complication ([place])   . High cholesterol   . Hypertension   . Iron deficiency anemia 03/11/2017  . Sarcoidosis     Current Outpatient Medications  Medication Sig Dispense Refill  . albuterol (PROVENTIL HFA;VENTOLIN HFA) 108 (90 Base) MCG/ACT inhaler Inhale 2 puffs into the lungs every 4 (four) hours as needed for wheezing or shortness of breath (cough, shortness of breath or wheezing.). 1 Inhaler 1  . aspirin EC 81 MG tablet Take 81 mg by mouth.    Marland Kitchen atorvastatin (LIPITOR) 20 MG tablet TAKE 1 TABLET BY MOUTH ONCE DAILY 90 tablet 0  . Continuous Blood Gluc Receiver (FREESTYLE LIBRE 14 DAY READER) DEVI 1 Device by Does not apply  route daily as needed. 1 Device 0  . Continuous Blood Gluc Sensor (FREESTYLE LIBRE 14 DAY SENSOR) MISC USE AS DIRECTED 2 each 1  . ferrous sulfate 325 (65 FE) MG EC tablet Take 325 mg by mouth daily.    . fluticasone furoate-vilanterol (BREO ELLIPTA) 200-25 MCG/INH AEPB Inhale 1 puff into the lungs daily. 60 each 1  . Insulin Glargine, 1 Unit Dial, (TOUJEO SOLOSTAR) 300 UNIT/ML SOPN Inject 20 Units into the skin daily. 6 mL 0  .  lisinopril (PRINIVIL,ZESTRIL) 10 MG tablet TAKE 1 TABLET BY MOUTH ONCE DAILY 90 tablet 0  . metFORMIN (GLUCOPHAGE-XR) 500 MG 24 hr tablet TAKE 2 TABLETS BY MOUTH TWICE DAILY WITH MEALS 360 tablet 0  . Multiple Vitamin (MULTIVITAMIN WITH MINERALS) TABS tablet Take 1 tablet by mouth daily.    . nitrofurantoin, macrocrystal-monohydrate, (MACROBID) 100 MG capsule Take 1 capsule (100 mg total) by mouth 2 (two) times daily for 7 days. 14 capsule 0   No current facility-administered medications for this visit.     No Known Allergies  Social History   Socioeconomic History  . Marital status: Married    Spouse name: Zynzulu  . Number of children: 4  . Years of education: Not on file  . Highest education level: Not on file  Occupational History  . Not on file  Social Needs  . Financial resource strain: Not on file  . Food insecurity:    Worry: Never true    Inability: Never true  . Transportation needs:    Medical: No    Non-medical: No  Tobacco Use  . Smoking status: Never Smoker  . Smokeless tobacco: Never Used  Substance and Sexual Activity  . Alcohol use: No  . Drug use: No  . Sexual activity: Not Currently  Lifestyle  . Physical activity:    Days per week: Not on file    Minutes per session: Not on file  . Stress: Not on file  Relationships  . Social connections:    Talks on phone: Not on file    Gets together: Not on file    Attends religious service: Not on file    Active member of club or organization: Not on file    Attends meetings of clubs or organizations: Not on file    Relationship status: Not on file  . Intimate partner violence:    Fear of current or ex partner: Not on file    Emotionally abused: Not on file    Physically abused: Not on file    Forced sexual activity: Not on file  Other Topics Concern  . Not on file  Social History Narrative  . Not on file    ROS Review of Systems  Constitutional: Negative for activity change, appetite change, chills  and fever.  HENT: Negative for congestion, nosebleeds, trouble swallowing and voice change.   Respiratory: Negative for cough, shortness of breath and wheezing.   Gastrointestinal: Negative for diarrhea, nausea and vomiting.  Genitourinary: Negative for difficulty urinating, dysuria and hematuria.  Musculoskeletal: Negative for back pain, joint swelling and neck pain.  Neurological: Negative for dizziness, speech difficulty, light-headedness and numbness.  See HPI. All other review of systems negative.   Objective   Vitals as reported by the patient: Today's Vitals   07/28/18 1227  BP: (!) 165/73  Pulse: 74  Temp: 98 F (36.7 C)  TempSrc: Oral  Weight: 195 lb (88.5 kg)  Height: '5\' 7"'$  (1.702 m)    Raenette was  seen today for diabetes.  Diagnoses and all orders for this visit:  Essential hypertension- bp uncontrolled, will increase lisinopril to '20mg'$  after reviewing renal function  -     POCT glycosylated hemoglobin (Hb A1C) -     CMP14+EGFR -     POCT urinalysis dipstick  Type 2 diabetes mellitus with hypoglycemia without coma, with long-term current use of insulin ([place])- will decrease toujeo to 20 units and titrate to a goal of a1c 7% or less She tries to eat regular meals -     POCT glycosylated hemoglobin (Hb A1C) -     CMP14+EGFR -     POCT urinalysis dipstick  Acute bilateral low back pain without sciatica- will rule out UTI -     Urine Culture  Abnormal urinalysis- will treat empirically with macrobid -     Urine Culture  Type 2 diabetes mellitus without complication, with long-term current use of insulin ([facility]) -     Insulin Glargine, 1 Unit Dial, (TOUJEO SOLOSTAR) 300 UNIT/ML SOPN; Inject 20 Units into the skin daily.  Other orders -     nitrofurantoin, macrocrystal-monohydrate, (MACROBID) 100 MG capsule; Take 1 capsule (100 mg total) by mouth 2 (two) times daily for 7 days.   Performed education about hypoglycemia Brought patient in to check vitals and to get  a fresh urine sample Pt had a nurse visit for vitals after the telephone visit  I discussed the assessment and treatment plan with the patient. The patient was provided an opportunity to ask questions and all were answered. The patient agreed with the plan and demonstrated an understanding of the instructions.   The patient was advised to call back or seek an in-person evaluation if the symptoms worsen or if the condition fails to improve as anticipated.  I provided 25 minutes of non-face-to-face time during this encounter.  Forrest Moron, MD  Primary Care at [facility]

## 2018-07-28 NOTE — Progress Notes (Signed)
CC: F/u diabetes.  C/o low back pain x 1 month.  Taking tylenol for pain w/o relief.  Pain is subsiding some.  Pain level 9/10 on Saturday and 5/10 most days.  Pt needs trulicity refill.  No travel outside the [place] or [place] in the past 3 weeks.

## 2018-07-29 ENCOUNTER — Other Ambulatory Visit: Payer: Self-pay | Admitting: Family Medicine

## 2018-07-29 ENCOUNTER — Other Ambulatory Visit: Payer: Self-pay | Admitting: Pharmacy Technician

## 2018-07-29 DIAGNOSIS — I10 Essential (primary) hypertension: Secondary | ICD-10-CM

## 2018-07-29 LAB — CMP14+EGFR
ALT: 20 IU/L (ref 0–32)
AST: 33 IU/L (ref 0–40)
Albumin/Globulin Ratio: 1.3 (ref 1.2–2.2)
Albumin: 4.2 g/dL (ref 3.8–4.8)
Alkaline Phosphatase: 62 IU/L (ref 39–117)
BUN/Creatinine Ratio: 13 (ref 12–28)
BUN: 11 mg/dL (ref 8–27)
Bilirubin Total: 0.5 mg/dL (ref 0.0–1.2)
CO2: 19 mmol/L — ABNORMAL LOW (ref 20–29)
Calcium: 9.3 mg/dL (ref 8.7–10.3)
Chloride: 103 mmol/L (ref 96–106)
Creatinine, Ser: 0.83 mg/dL (ref 0.57–1.00)
GFR calc Af Amer: 83 mL/min/{1.73_m2} (ref >59)
GFR calc non Af Amer: 72 mL/min/{1.73_m2} (ref >59)
Globulin, Total: 3.2 g/dL (ref 1.5–4.5)
Glucose: 70 mg/dL (ref 65–99)
Potassium: 5.2 mmol/L (ref 3.5–5.2)
Sodium: 144 mmol/L (ref 134–144)
Total Protein: 7.4 g/dL (ref 6.0–8.5)

## 2018-07-29 MED ORDER — LISINOPRIL 20 MG PO TABS: 20.0000 mg | ORAL_TABLET | Freq: Every day | ORAL | 1 refills | Status: DC

## 2018-07-29 NOTE — Telephone Encounter (Signed)
[employer] calling again about the assistance for this patient?

## 2018-07-29 NOTE — Patient Outreach (Signed)
[facility]) Care Management  07/29/2018  Jeanette Ellis 1951/01/17 [id number]   Care coordination call placed to Dr. Remus Blake office in regards to patient's [employer] application for [employer].  Spoke to [name] who informed she would send another note back as she was unsure of where they were in the process.  Once the provider faxes in his part of the application then the entire completed application can be submitted to [employer] patient assistance program.  Stacie Acres. Darolyn Double, CPhT Musician Care Management [phone]

## 2018-08-01 ENCOUNTER — Telehealth: Payer: Self-pay | Admitting: Family Medicine

## 2018-08-01 ENCOUNTER — Other Ambulatory Visit: Payer: Self-pay | Admitting: Family Medicine

## 2018-08-01 DIAGNOSIS — Z794 Long term (current) use of insulin: Principal | ICD-10-CM

## 2018-08-01 DIAGNOSIS — E119 Type 2 diabetes mellitus without complications: Secondary | ICD-10-CM

## 2018-08-04 ENCOUNTER — Other Ambulatory Visit: Payer: Self-pay | Admitting: Pharmacy Technician

## 2018-08-04 NOTE — Patient Outreach (Signed)
[facility]) Care Management  08/04/2018  Jeanette Ellis 10-30-1950 [id number]  Successful outreach call placed to patient in regards to [employer] application for [employer].  Spoke to patient, HIPAA identifiers verified.  Inquired if patient had made an appointment with Dr Lucianne Muss in regards to her diabetes and insulin treatment. Patient informed that Dr. Creta Levin has been following her insulin doses. She asked me to send the applications over to Dr. Creta Levin for her signature.  Will fax over the applications over to Dr Creta Levin office on Tuesday 08/05/2018 when I am back in the office. Will followup with providers office in 5-7 business days if application has not been faxed back.  [name] P. Jama Mcmiller, CPhT Musician Care Management [phone]

## 2018-08-05 ENCOUNTER — Other Ambulatory Visit: Payer: Self-pay | Admitting: Pharmacy Technician

## 2018-08-05 NOTE — Telephone Encounter (Signed)
Jeanette Ellis with [facility] called to follow up about paperwork that was supposed to be mailed to her. Stated she has not received it and the company needs all documents to be originals. Asking for callback from [place]. [phone]

## 2018-08-05 NOTE — Progress Notes (Signed)
Virtual Visit via Telephone Note  I connected with Jeanette Ellis on 08/05/18 at 11:00 AM EDT by telephone and verified that I am speaking with the correct person using two identifiers.   I discussed the limitations, risks, security and privacy concerns of performing an evaluation and management service by telephone and the availability of in person appointments. I also discussed with the patient that there may be a patient responsible charge related to this service. The patient expressed understanding and agreed to proceed.  Synopsis: Diagnosed with sarcoidosis more than 30 years ago with a mediastinoscopy and had been treated with intermittent prednisone.  She had not required any therapy for sarcoidosis for many years. No evidence on PAH on echocardiogram.   History of Present Illness: 68 year old female, never smoked. PMH significant for sarcoidosis, hypertension, diabetes type 2. Maintained on Breo 200. Referred to rheumatology.  08/06/2018 Patient called today for 6 month follow-up. Breathing is better, slight cough. No recent exacerbations of her sarcoidosis requiring prednisone. Reports last flare in spring 2019. States that Virgel Bouquet is too expense (costing her $81). Plans on contacting her insurance company to find out alternative. Referred to rheumatology but unfortunately no showed for that appointment. Explained to patient reason for referral d/t pos ANA, patient states understanding.    Observations/Objective:  Observations: No shortness of breath, wheezing or cough noted during phone conversation   Testing: Follow-up CT reviewed with stable findings In addition there are emphysematous changes with possible honeycombing at the base.  This is not suggestive of UIP pattern.  Serologies noted for elevated ANA.  Refer her to rheumatology for further evaluation.  Assessment and Plan:  Sarcoidosis  - Stable; no recent exacerbations requiring oral prednisone - Continue Breo 1 puff  daily (sample given) - CT 08/02/17 showed emphysematous changes with possible honeycombing at the base  - ANA positive - Re-refer to rheumatology    Follow Up Instructions:   - Follow-up 2-4 months with Dr. Isaiah Serge or sooner if needed   I discussed the assessment and treatment plan with the patient. The patient was provided an opportunity to ask questions and all were answered. The patient agreed with the plan and demonstrated an understanding of the instructions.   The patient was advised to call back or seek an in-person evaluation if the symptoms worsen or if the condition fails to improve as anticipated.  I provided 20 minutes of non-face-to-face time during this encounter.   Glenford Bayley, NP

## 2018-08-05 NOTE — Patient Outreach (Signed)
[facility]) Care Management  08/05/2018  Jeanette Ellis 17-Oct-1950 [id number]    Care coordination call placed to Dr Creta Levin office in regards to [facility] application for [facility].  Spoke to front office staff/receptionist who took a message for me to Dr. Creta Levin Clinical staff. Informed her that I was following up with the mailing of a [employer] application. Informed her that Jeanette Ellis had indicated a few weeks ago that she had placed the signed application in the mail but I have yet to receive it and was following up. She informed she would have someone call me.  Jeanette Ellis, CPhT Musician Care Management [phone]

## 2018-08-05 NOTE — Patient Outreach (Signed)
[facility]) Care Management  08/05/2018  VALLORY PILGRIM [birth year] [id number]    ADDENDUM  Incoming call received from [place] at Dr. Creta Levin office.   [place] informed that she did mail the application back on 07/08/2018. However, since I have yet to receive it, I inquired if we could inter office another application to the provider. [place] agreed.   Also informed [place] that we had faxed over an insulin patient assistance provider application for Dr. Creta Levin to sign and fax back. [place] verbalized understanding.  Will interoffice application to Dr. Creta Levin office and will followup in 2-3 business days to confirm receipt of both applications.  Mats Jeanlouis P. Ileana Chalupa, CPhT Musician Care Management [phone]

## 2018-08-06 ENCOUNTER — Encounter: Payer: Self-pay | Admitting: Primary Care

## 2018-08-06 ENCOUNTER — Ambulatory Visit (INDEPENDENT_AMBULATORY_CARE_PROVIDER_SITE_OTHER): Payer: HMO | Admitting: Primary Care

## 2018-08-06 ENCOUNTER — Other Ambulatory Visit: Payer: Self-pay

## 2018-08-06 DIAGNOSIS — D869 Sarcoidosis, unspecified: Secondary | ICD-10-CM | POA: Diagnosis not present

## 2018-08-06 DIAGNOSIS — R768 Other specified abnormal immunological findings in serum: Secondary | ICD-10-CM

## 2018-08-06 MED ORDER — FLUTICASONE FUROATE-VILANTEROL 200-25 MCG/INH IN AEPB: 1.0000 | INHALATION_SPRAY | Freq: Every day | RESPIRATORY_TRACT | 0 refills | Status: DC

## 2018-08-06 NOTE — Patient Instructions (Addendum)
Continue Breo 1 puff daily (sample given)  Call insurance company and ask for cheaper alternative to [facility] ("what is the [employer]CS/L[employer] formulary drug on plan")  Call us if you find an alternative to [facility] and we will send in a new prescription. If no cheaper alternative, we may be able to apply for patient assistance if you qualify through the specific drug company.   Referral to rheumatology for positive ANA (we want to rule out any autoimmune disorders)  Follow up with Dr. Isaiah Serge in 2-4 months or sooner if symptoms worsen (increased shortness of breath, chest tightness, chest pain, new or worse cough or wheezing)   Sarcoidosis  Sarcoidosis is a disease that can cause inflammation in many areas of the body. It most often affects the lungs (pulmonary sarcoidosis). Sarcoidosis can also affect the lymph nodes, liver, eyes, skin, heart, or any other body tissue. Normally, cells that are part of your body's disease-fighting system (immune system) attack harmful substances (such as germs) in your body. This immune system response causes inflammation. After the harmful substance is destroyed, the inflammation and the immune cells go away. When you have sarcoidosis, your immune system causes inflammation even when there are no harmful substances, and the inflammation does not go away. Sarcoidosis also causes cells from your immune system to form small clumps of tissue (granulomas) in the affected area of your body. What are the causes? The exact cause of sarcoidosis is not known.  It is possible that if you have a family history of this disease (genetic predisposition), the immune system response that leads to inflammation may be triggered by something in your environment, such as:  Bacteria or viruses.  Metals.  Chemicals.  Dust.  Mold or mildew. What increases the risk? You may be at a greater risk for sarcoidosis if you:  Have a family history of the disease.  Are African-American.  Are of  Northern European descent.  Are 74-38 years old.  Work as a IT sales professional.  Work in an environment where you are exposed to metals, chemicals, mold or mildew, or insecticides. What are the signs or symptoms? Some people with sarcoidosis have no symptoms. Others have very mild symptoms. The symptoms usually depend on the organ that is affected. Sarcoidosis most often affects the lungs, which may include symptoms such as:  Chest pain.  Coughing.  Wheezing.  Shortness of breath. Other common symptoms include:  Night sweats.  Fever.  Weight loss.  Fatigue.  Swollen lymph nodes.  Joint pain. How is this diagnosed? Sarcoidosis may be diagnosed based on:  Your symptoms and medical history.  A physical exam.  Imaging tests to check for granulomas such as: ? Chest X-ray. ? CT scan. ? MRI. ? PET scan.  Lung function tests. These tests evaluate your breathing and check for problems that may be related to sarcoidosis.  A procedure to remove a tissue sample for testing (biopsy). You may have a biopsy of lung tissue if that is where you are having symptoms. You may have tests to check for any complications of the condition. These tests may include:  Eye exams.  MRI of the heart or brain.  Echocardiogram.  Electrocardiogram (EKG or ECG). How is this treated? In some cases, sarcoidosis does not require a specific treatment because it causes no symptoms or mild symptoms. If your symptoms bother you or are severe, you may be prescribed medicines to reduce inflammation or relieve symptoms. These medicines may include:  Prednisone. This is a steroid that  reduces inflammation related to sarcoidosis.  Hydroxychloroquine. This may be used to treat sarcoidosis that affects the skin, eyes, or brain.  Methotrexate, leflunomide, or azathioprine. These medicines affect the immune system and can help with sarcoidosis in the joints, eyes, skin, or lungs.  Medicines that you breathe in  (inhalers). Inhalers can help you breathe if sarcoidosis affects your lungs. Follow these instructions at home:   Do not use any products that contain nicotine or tobacco, such as cigarettes and e-cigarettes. If you need help quitting, ask your health care provider.  Avoid secondhand smoke and irritating dust or chemicals. Stay indoors on days when air quality is poor in your area.  Return to your normal activities as told by your health care provider. Ask your health care provider what activities are safe for you.  Take or use over-the-counter and prescription medicines only as told by your health care provider.  Keep all follow-up visits as told by your health care provider. This is important. Contact a health care provider if:  You have vision problems.  You have a dry cough that does not go away.  You have an irregular heartbeat.  You have pain or aches in your joints, hands, or feet.  You have an unexplained rash. Get help right away if:  You have chest pain.  You have difficulty breathing. Summary  Sarcoidosis is a disease that can cause inflammation in many body areas of the body. It most often affects the lungs (pulmonary sarcoidosis). It can also affect the lymph nodes, liver, eyes, skin, heart, or any other body tissue.  When you have sarcoidosis, cells from your immune system form small clumps of tissue (granulomas) in the affected area of your body.  Sarcoidosis sometimes does not require a specific treatment because it causes no symptoms or mild symptoms.  If your symptoms bother you or are severe, you may be prescribed medicines to reduce inflammation or relieve symptoms. This information is not intended to replace advice given to you by your health care provider. Make sure you discuss any questions you have with your health care provider. Document Released: 01/25/2004 Document Revised: 01/01/2017 Document Reviewed: 01/01/2017 Elsevier Interactive Patient  Education  2019 [employer]

## 2018-08-07 NOTE — Telephone Encounter (Signed)
[place] do you remember what you did with these papers.

## 2018-08-11 ENCOUNTER — Other Ambulatory Visit: Payer: Self-pay | Admitting: Pharmacy Technician

## 2018-08-11 NOTE — Patient Outreach (Signed)
[facility]) Care Management  08/11/2018  Jeanette Ellis [birth year] [id number]   Care coordination call placed to [facility] in regards to patient's application for Trulicity and Basaglar.  Called [employer] to inquire if they had received the provider's portion of the application as stated by Dr. Creta Levin office below. [name], MD  Jeanette Ellis, Jeanette Ellis, CPhT        I sent in for Trulicity but did not see hte form for the [facility] substitution. I will look for that   Jeanette Ellis   Jeanette Ellis informed the patient was not in the system and they have not received any information from patient or provider.  Sent Dr. Creta Levin an inbakset message regarding this information provided by [facility] at [facility] to inquire where Dr. Creta Levin sent the application.  Will followup with Dr. Creta Levin office in 7-10 business days as office hours have changed due to COVID19.  Larrie Lucia P. Iliza Blankenbeckler, CPhT Musician Care Management [phone]

## 2018-08-11 NOTE — Telephone Encounter (Signed)
Mrs. Gilford Raid has re-sent paper work for this pt.

## 2018-08-12 ENCOUNTER — Telehealth: Payer: Self-pay

## 2018-08-12 ENCOUNTER — Other Ambulatory Visit: Payer: Self-pay | Admitting: Pharmacy Technician

## 2018-08-12 NOTE — Patient Outreach (Signed)
[facility]) Care Management  08/12/2018  Jeanette Ellis 06-Jun-1950 [id number]    Care coordination call received from [facility] at Dr. Creta Levin office.  She called to inform that she mailed back the application directly to me at [facility] via bulk mail (courier). She informed she made a copy of the application in case I do not receive it back.  Will followup with her in 3-5 business days if not received.  Jeanette Ellis P. [name], CPhT Musician Care Management [phone]

## 2018-08-12 NOTE — Telephone Encounter (Signed)
Spoke with Jeanette Ellis an advised I mailed completed form on last week.  Advised it does some time take a while when we send things out thru [facility] bulk mail.  Advised if she has not received by end of the week I do have a copy of the form with me.  She advises if not received she could possibly stop by office but we will cross that bridge when we get to it.  Advised in agreement with plan of action. Dgaddy, CMA

## 2018-08-13 NOTE — Telephone Encounter (Addendum)
Relation to pt: self  Call back [phone] Pharmacy: [facility] [address], [place] - [address] [phone] (Phone) [phone] (Fax)      Reason for call:  Patient checking on the status of 08/01/2018 Insulin Glargine, 1 Unit Dial, (TOUJEO SOLOSTAR) 300 UNIT/ML SOPN medication request. Patient in need of clarity regarding if SIG/Units changed and would like script sent over patient doesn't want to run out, please advise

## 2018-08-14 ENCOUNTER — Other Ambulatory Visit: Payer: Self-pay | Admitting: Pharmacy Technician

## 2018-08-14 NOTE — Patient Outreach (Signed)
[facility]) Care Management  08/14/2018  Jeanette Ellis March 27, 1951 [id number]    Received all necessary documents, paperwork and signatures for [facility] application for Trulicity from both patient and provider.  Submitted completed application via fax to [employer].  Will followup with Lilly Cares in 5-7 business days to inquire about status of application.   Today I refaxed to Dr. Collie Siad office the [place] portion of the application for [employer] *substituted for [employer]*  Today I also sent out an inter office provider portion of Merck application for [employer] and Proventil HFA *substituted for [employer] and [employer]. Jeanette Ellis, CPhT Musician Care Management [phone]

## 2018-08-15 NOTE — Telephone Encounter (Signed)
HI, have this been sent to pt pharmacy? Or is this the medication from the [facility] that we had to resent the [facility] for?

## 2018-08-16 ENCOUNTER — Other Ambulatory Visit: Payer: Self-pay | Admitting: Family Medicine

## 2018-08-16 DIAGNOSIS — E119 Type 2 diabetes mellitus without complications: Secondary | ICD-10-CM

## 2018-08-16 DIAGNOSIS — Z794 Long term (current) use of insulin: Secondary | ICD-10-CM

## 2018-08-18 ENCOUNTER — Other Ambulatory Visit: Payer: Self-pay | Admitting: Pharmacy Technician

## 2018-08-18 NOTE — Patient Outreach (Signed)
[facility]) Care Management  08/18/2018  MARLEINA METTER 21-Jan-1951 [id number]    Care coordination call placed to [facility] in regards to patient's Trulicity application.  Spoke to [facility] who informed that patient has been APPROVED 08/18/2018-04/09/2019. Per Karalee Height since patient was just approved today she has not shipping information. She did inform that it can take between 2-7 business days for [facility] to ship out the medication.  Will followup with Lilly in 5-7 business days for delivery information.  Gokul Waybright P. Tameria Patti, CPhT Musician Care Management [phone]

## 2018-08-18 NOTE — Telephone Encounter (Signed)
Sierra I am not sure if its to be filled by [facility] or not.  Check with them and if not refill the medication for the pt if she is out

## 2018-08-19 ENCOUNTER — Other Ambulatory Visit: Payer: Self-pay | Admitting: Family Medicine

## 2018-08-19 ENCOUNTER — Telehealth: Payer: Self-pay

## 2018-08-19 DIAGNOSIS — E119 Type 2 diabetes mellitus without complications: Secondary | ICD-10-CM

## 2018-08-19 NOTE — Telephone Encounter (Signed)
Pt called to clinic req refill of insulin pen.  Attempted to c/b x4 to update on status per [facility] pharmacy notes.  Unable to reach, no v/m.  If pt c/b, please advise her that [facility] has submitted application per notes.

## 2018-08-20 ENCOUNTER — Other Ambulatory Visit: Payer: Self-pay | Admitting: Family Medicine

## 2018-08-20 DIAGNOSIS — Z794 Long term (current) use of insulin: Secondary | ICD-10-CM

## 2018-08-20 DIAGNOSIS — E119 Type 2 diabetes mellitus without complications: Secondary | ICD-10-CM

## 2018-08-20 NOTE — Telephone Encounter (Signed)
Requested medication (s) are due for refill today: yes  Requested medication (s) are on the active medication list: yes  Last refill:  07/28/2018  Future visit scheduled: no  Notes to clinic:  Multiple choices per insurance    Requested Prescriptions  Pending Prescriptions Disp Refills   TOUJEO SOLOSTAR 300 UNIT/ML SOPN [Pharmacy Med Name: Toujeo SoloStar 300 UNIT/ML Subcutaneous Solution Pen-injector] 6 mL 0    Sig: INJECT 25 UNITS SUBCUTANEOUSLY ONCE DAILY     Endocrinology:  Diabetes - Insulins Passed - 08/20/2018 11:15 AM      Passed - HBA1C is between 0 and 7.9 and within 180 days    Hemoglobin A1C  Date Value Ref Range Status  07/28/2018 6.8 (A) 4.0 - 5.6 % Final         Passed - Valid encounter within last 6 months    Recent Outpatient Visits          3 weeks ago Essential hypertension   Primary Care at [facility], Manus Rudd, MD   1 month ago Medicare annual wellness visit, subsequent   Primary Care at [facility], [name], MD   4 months ago Type 2 diabetes mellitus without complication, with long-term current use of insulin (HCC)   Primary Care at [facility], [name], MD   7 months ago Type 2 diabetes mellitus without complication, with long-term current use of insulin [facility]   Primary Care at [place], [place] D, PA-C   9 months ago Bronchitis with bronchospasm   Primary Care at [facility], [place] D, [place]

## 2018-08-21 ENCOUNTER — Other Ambulatory Visit: Payer: Self-pay | Admitting: Pharmacy Technician

## 2018-08-21 NOTE — Patient Outreach (Signed)
[facility]) Care Management  08/21/2018  TANETTA FUHRIMAN 12-11-1950 [id number]    Received all necessary paperwork and signatures for [facility] patient assistance from both provider and patient for [facility] and Proventil. Submitted completed application via mail to [employer].   Will followup with Merck in 10-14 business days.  Successful outreach call placed to patient in regards to Merck patient assistance for [employer] and Proventil and [employer] patient assistance for [employer].  Spoke to patient, HIPAA identifiers verified.  Informed patient that she had been approved for Truliticy through [employer] patient assistance. Informed patient that she may receive a phone call from Lilly[facility] asking where she would like the medication shipped to and to inquire about a good ship date. Patient verbalized understanding.  Informed patient that I have tried numerous times via fax, email and phone calls to inquire if Dr Nolon Rod would be willing to switch her from [place] to [employer]. Since she is approved with Lilly for Trulicity then she would be approved for the [employer]. Patient does not qualify for the Toujeo patient assistance program due to that program requiring an out of pocket spend which patient has not met. Patient will try and reach out to provider's office.  Informed patient that the Merck application was mailed today and that she should receive a letter in the mail in approximately 2 weeks. Informed patient to call me when she receives the attestation letter so we could discuss together.  Will followup with patient and Lilly in approximately 7-14 business days.  Veldon Wager P. Verenis Nicosia, [place] Management [phone]

## 2018-08-22 ENCOUNTER — Telehealth: Payer: Self-pay | Admitting: Family Medicine

## 2018-08-22 NOTE — Telephone Encounter (Signed)
Copied from CRM [id number]. Topic: Quick Communication - Rx Refill/Question >> Aug 22, 2018  5:50 PM Rica Koyanagi, Barbee Cough wrote: Medication: trulicity 0.75mg   Has the patient contacted their pharmacy? Yes.   (Agent: If no, request that the patient contact the pharmacy for the refill.) (Agent: If yes, when and what did the pharmacy advise?)  Preferred Pharmacy (with phone number or street name): [phone] - [facility] lily care assistance  Cb is [phone]   Agent: Please be advised that RX refills may take up to 3 business days. We ask that you follow-up with your pharmacy.

## 2018-08-25 NOTE — Telephone Encounter (Signed)
Good morning Dr. Creta Levin,   Please see below for the request from [facility] to switch the patient from [facility] to Basaglar due to the patient not meeting requirements for patient assistance.   Thank you,  Doyne Keel

## 2018-08-25 NOTE — Telephone Encounter (Signed)
Medication is not listed on her medication list, are you managing this medication for pt?

## 2018-08-25 NOTE — Progress Notes (Signed)
Presents today for [employer] Visit  I connected with  Jeanette Ellis on 08/25/18 by a Telephone  telemedicine application and verified that I am speaking with the correct person using two identifiers.    The patient expressed understanding and agreed to proceed.    Date of last exam:   Interpreter used for this visit? no  Patient Care Team: [name], MD as PCP - General (Internal Medicine) Patient, No Pcp Per (General Practice) Yetta Glassman, RN as [facility] [employer] Care Management Leavy Cella, Romilda Joy, [facility] as [facility] Therapist, music (Pharmacist) [place], Stacie Acres, CPhT as [facility] Doctor, hospital)     Immunization status:  Immunization History  Administered Date(s) Administered  . Influenza, High Dose Seasonal PF 01/13/2018  . Influenza,inj,Quad PF,6+ Mos 05/11/2016  . Pneumococcal Conjugate-13 06/07/2014  . Pneumococcal Polysaccharide-23 09/08/2015  . Td 04/21/2018     Health Maintenance Due  Topic Date Due  . Hepatitis C Screening  May 16, 1950     Functional Status Survey: Is the patient deaf or have difficulty hearing?: No Does the patient have difficulty seeing, even when wearing glasses/contacts?: No Does the patient have difficulty concentrating, remembering, or making decisions?: No Does the patient have difficulty walking or climbing stairs?: No Does the patient have difficulty dressing or bathing?: No Does the patient have difficulty doing errands alone such as visiting a doctor's office or shopping?: No   6CIT Screen 07/07/2018  What Year? 0 points  What month? 0 points  What time? 0 points  Count back from 20 0 points  Months in reverse 0 points  Repeat phrase 0 points  Total Score 0        Office Visit from 07/07/2018 in Primary Care at [facility]  AUDIT-C Score  0         Patient Active Problem List   Diagnosis Date Noted  . Microalbuminuria due to type  2 diabetes mellitus (HCC) 09/19/2017  . Sarcoidosis 09/06/2017  . Diabetic retinopathy (HCC) 08/21/2017  . Iron deficiency anemia 03/11/2017  . Anemia associated with stage 3 chronic renal failure (HCC) 03/11/2017  . History of sarcoidosis 06/09/2015  . Type 2 diabetes mellitus without complication, with long-term current use of insulin (HCC) 01/12/2014  . Essential hypertension 09/21/2013  . Hyperlipemia 09/21/2013     Past Medical History:  Diagnosis Date  . Anemia associated with stage 3 chronic renal failure (HCC) 03/11/2017  . Diabetes mellitus without complication (HCC)   . High cholesterol   . Hypertension   . Iron deficiency anemia 03/11/2017  . Sarcoidosis      Past Surgical History:  Procedure Laterality Date  . ABDOMINAL HYSTERECTOMY       Family History  Problem Relation Age of Onset  . High blood pressure Mother   . Diabetes Father      Social History   Socioeconomic History  . Marital status: Married    Spouse name: Zynzulu  . Number of children: 4  . Years of education: Not on file  . Highest education level: Not on file  Occupational History  . Not on file  Social Needs  . Financial resource strain: Not on file  . Food insecurity:    Worry: Never true    Inability: Never true  . Transportation needs:    Medical: No    Non-medical: No  Tobacco Use  . Smoking status: Never Smoker  . Smokeless tobacco: Never Used  Substance and Sexual Activity  . Alcohol use: No  . Drug use: No  . Sexual activity: Not Currently  Lifestyle  . Physical activity:    Days per week: Not on file    Minutes per session: Not on file  . Stress: Not on file  Relationships  . Social connections:    Talks on phone: Not on file    Gets together: Not on file    Attends religious service: Not on file    Active member of club or organization: Not on file    Attends meetings of clubs or organizations: Not on file    Relationship status: Not on file  . Intimate  partner violence:    Fear of current or ex partner: Not on file    Emotionally abused: Not on file    Physically abused: Not on file    Forced sexual activity: Not on file  Other Topics Concern  . Not on file  Social History Narrative  . Not on file     No Known Allergies   Prior to Admission medications   Medication Sig Start Date End Date Taking? Authorizing Provider  albuterol (PROVENTIL HFA;VENTOLIN HFA) 108 (90 Base) MCG/ACT inhaler Inhale 2 puffs into the lungs every 4 (four) hours as needed for wheezing or shortness of breath (cough, shortness of breath or wheezing.). 10/28/17  Yes Barnett Abu, [place] D, PA-C  atorvastatin (LIPITOR) 20 MG tablet TAKE 1 TABLET BY MOUTH ONCE DAILY 04/14/18  Yes Myles Lipps, MD  Continuous Blood Gluc Receiver (FREESTYLE LIBRE 14 DAY READER) DEVI 1 Device by Does not apply route daily as needed. 10/28/17  Yes [name], PA-C  ferrous sulfate 325 (65 FE) MG EC tablet Take 325 mg by mouth daily.   Yes [provider]  fluticasone furoate-vilanterol (BREO ELLIPTA) 200-25 MCG/INH AEPB Inhale 1 puff into the lungs daily. 01/13/18  Yes Barnett Abu, [place] D, PA-C  metFORMIN (GLUCOPHAGE-XR) 500 MG 24 hr tablet TAKE 2 TABLETS BY MOUTH TWICE DAILY WITH MEALS 06/04/18  Yes [name], MD  Multiple Vitamin (MULTIVITAMIN WITH MINERALS) TABS tablet Take 1 tablet by mouth daily.   Yes [provider]  aspirin EC 81 MG tablet Take 81 mg by mouth.    [provider]  Continuous Blood Gluc Sensor (FREESTYLE LIBRE 14 DAY SENSOR) MISC USE AS DIRECTED 08/01/18   Collie Siad A, MD  fluticasone furoate-vilanterol (BREO ELLIPTA) 200-25 MCG/INH AEPB Inhale 1 puff into the lungs daily. 08/06/18   Glenford Bayley, NP  Insulin Glargine, 1 Unit Dial, (TOUJEO SOLOSTAR) 300 UNIT/ML SOPN Inject 20 Units into the skin daily. 07/28/18   [name], MD  lisinopril (ZESTRIL) 20 MG tablet Take 1 tablet (20 mg total) by mouth daily. 07/29/18    [name], MD     Depression screen [facility] 2/9 07/28/2018 07/23/2018 07/07/2018 04/21/2018 02/03/2018  Decreased Interest 0 0 0 0 0  Down, Depressed, Hopeless 0 0 0 0 0  PHQ - 2 Score 0 0 0 0 0     Fall Risk  07/28/2018 07/07/2018 04/21/2018 02/03/2018 01/13/2018  Falls in the past year? 0 0 0 No No  Number falls in past yr: 0 0 - - -  Injury with Fall? 0 - - - -  Follow up Falls evaluation completed - - - -      PHYSICAL EXAM: Ht 5\' 7"  (1.702 m)   Wt 195 lb (88.5 kg)   BMI 30.54 kg/m  Wt Readings from Last 3 Encounters:  07/28/18 195 lb (88.5 kg)  07/07/18 195 lb (88.5 kg)  04/21/18 195 lb 12.8 oz (88.8 kg)     No exam data present    Physical Exam   Education/Counseling provided regarding diet and exercise, prevention of chronic diseases, smoking/tobacco cessation, if applicable, and reviewed "Covered Medicare Preventive Services."   ASSESSMENT/PLAN: 1. Medicare annual wellness visit, subsequent    Dr. London Sheer -Opthamologist.  Up to date on all immunizations  Has free style Josephine Igo says she is feeling much better Due for diabetic check in 10-2018  Lives in two story house with spouse and nephew  No trouble climbing stairs  No trouble getting out of chair   No scattered rugs No grab bars House is well lit.  Patient walks 4 days a week Member at [employer] does not use as right now.  No advanced directive will mail to patient.   Breakfast : grits/saugsage/bacon/cereal Lunch: sandwich Dinner variety of things  Message sent to schedule a follow up for patient back pain not sleeping well was seen 04/2018 for this issue.     Presents today for [employer] Wellness Visit      Patient Care Team: [name], MD as PCP - General (Internal Medicine) Patient, No Pcp Per (General Practice) Yetta Glassman, RN as [facility] [employer] Care Management Beecher Mcardle, [facility] as [facility] [employer] Care Management  (Pharmacist) Simcox, Stacie Acres, CPhT as [facility] Doctor, hospital)    Other Screening: Last screening for diabetes: 04/21/2018   ADVANCE DIRECTIVES: Discussed: yes  Materials Provided:  Mailed to patient  Immunization status:  Immunization History  Administered Date(s) Administered  . Influenza, High Dose Seasonal PF 01/13/2018  . Influenza,inj,Quad PF,6+ Mos 05/11/2016  . Pneumococcal Conjugate-13 06/07/2014  . Pneumococcal Polysaccharide-23 09/08/2015  . Td 04/21/2018     Health Maintenance Due  Topic Date Due  . Hepatitis C Screening  January 23, 1951     Functional Status Survey: Is the patient deaf or have difficulty hearing?: No Does the patient have difficulty seeing, even when wearing glasses/contacts?: No Does the patient have difficulty concentrating, remembering, or making decisions?: No Does the patient have difficulty walking or climbing stairs?: No Does the patient have difficulty dressing or bathing?: No Does the patient have difficulty doing errands alone such as visiting a doctor's office or shopping?: No   6CIT Screen 07/07/2018  What Year? 0 points  What month? 0 points  What time? 0 points  Count back from 20 0 points  Months in reverse 0 points  Repeat phrase 0 points  Total Score 0        Patient Active Problem List   Diagnosis Date Noted  . Microalbuminuria due to type 2 diabetes mellitus (HCC) 09/19/2017  . Sarcoidosis 09/06/2017  . Diabetic retinopathy (HCC) 08/21/2017  . Iron deficiency anemia 03/11/2017  . Anemia associated with stage 3 chronic renal failure (HCC) 03/11/2017  . History of sarcoidosis 06/09/2015  . Type 2 diabetes mellitus without complication, with long-term current use of insulin (HCC) 01/12/2014  . Essential hypertension 09/21/2013  . Hyperlipemia 09/21/2013     Past Medical History:  Diagnosis Date  . Anemia associated with stage 3 chronic renal failure (HCC) 03/11/2017  .  Diabetes mellitus without complication (HCC)   . High cholesterol   . Hypertension   . Iron deficiency anemia 03/11/2017  . Sarcoidosis      Past Surgical History:  Procedure Laterality Date  . ABDOMINAL HYSTERECTOMY       Family History  Problem Relation Age of Onset  . High blood pressure Mother   . Diabetes Father      Social History   Socioeconomic History  . Marital status: Married    Spouse name: Zynzulu  . Number of children: 4  . Years of education: Not on file  . Highest education level: Not on file  Occupational History  . Not on file  Social Needs  . Financial resource strain: Not on file  . Food insecurity:    Worry: Never true    Inability: Never true  . Transportation needs:    Medical: No    Non-medical: No  Tobacco Use  . Smoking status: Never Smoker  . Smokeless tobacco: Never Used  Substance and Sexual Activity  . Alcohol use: No  . Drug use: No  . Sexual activity: Not Currently  Lifestyle  . Physical activity:    Days per week: Not on file    Minutes per session: Not on file  . Stress: Not on file  Relationships  . Social connections:    Talks on phone: Not on file    Gets together: Not on file    Attends religious service: Not on file    Active member of club or organization: Not on file    Attends meetings of clubs or organizations: Not on file    Relationship status: Not on file  . Intimate partner violence:    Fear of current or ex partner: Not on file    Emotionally abused: Not on file    Physically abused: Not on file    Forced sexual activity: Not on file  Other Topics Concern  . Not on file  Social History Narrative  . Not on file     No Known Allergies   Prior to Admission medications   Medication Sig Start Date End Date Taking? Authorizing Provider  albuterol (PROVENTIL HFA;VENTOLIN HFA) 108 (90 Base) MCG/ACT inhaler Inhale 2 puffs into the lungs every 4 (four) hours as needed for wheezing or shortness of breath  (cough, shortness of breath or wheezing.). 10/28/17  Yes Barnett AbuWiseman, [place]Br[place] D, PA-C  atorvastatin (LIPITOR) 20 MG tablet TAKE 1 TABLET BY MOUTH ONCE DAILY 04/14/18  Yes Myles LippsSantiago, Irma M, MD  Continuous Blood Gluc Receiver (FREESTYLE LIBRE 14 DAY READER) DEVI 1 Device by Does not apply route daily as needed. 10/28/17  Yes Barnett AbuWiseman, [place]Br[place] D, PA-C  Continuous Blood Gluc Sensor (FREESTYLE LIBRE 14 DAY SENSOR) MISC USE AS DIRECTED 05/15/18  Yes [name], [name], MD  Dulaglutide (TRULICITY) 0.75 MG/0.5ML SOPN Inject 0.75 mg into the skin once a week. 04/15/18  Yes Reather LittlerKumar, Ajay, MD  ferrous sulfate 325 (65 FE) MG EC tablet Take 325 mg by mouth daily.   Yes [provider]  fluticasone furoate-vilanterol (BREO ELLIPTA) 200-25 MCG/INH AEPB Inhale 1 puff into the lungs daily. 01/13/18  Yes Barnett AbuWiseman, [place] D, PA-C  lisinopril (PRINIVIL,ZESTRIL) 10 MG tablet TAKE 1 TABLET BY MOUTH ONCE DAILY 04/14/18  Yes Myles LippsSantiago, Irma M, MD  metFORMIN (GLUCOPHAGE-XR) 500 MG 24 hr tablet TAKE 2 TABLETS BY MOUTH TWICE DAILY WITH MEALS 06/04/18  Yes Doristine BosworthStallings, [name], MD  Multiple Vitamin (MULTIVITAMIN WITH MINERALS) TABS tablet Take 1 tablet by mouth daily.   Yes [provider]  TOUJEO SOLOSTAR 300 UNIT/ML SOPN INJECT 25 UNITS SUBCUTANEOUSLY ONCE DAILY 06/04/18  Yes Doristine BosworthStallings, [name], MD  aspirin EC  81 MG tablet Take 81 mg by mouth.    [provider]     Depression screen [facility] 2/9 07/28/2018 07/23/2018 07/07/2018 04/21/2018 02/03/2018  Decreased Interest 0 0 0 0 0  Down, Depressed, Hopeless 0 0 0 0 0  PHQ - 2 Score 0 0 0 0 0     Fall Risk  07/28/2018 07/07/2018 04/21/2018 02/03/2018 01/13/2018  Falls in the past year? 0 0 0 No No  Number falls in past yr: 0 0 - - -  Injury with Fall? 0 - - - -  Follow up Falls evaluation completed - - - -      PHYSICAL EXAM: Ht  (1.702 m)   Wt 195 lb (88.5 kg)   BMI 30.54 kg/m    Wt Readings from Last 3 Encounters:  07/28/18 195 lb (88.5 kg)  07/07/18 195 lb  (88.5 kg)  04/21/18 195 lb 12.8 oz (88.8 kg)

## 2018-08-25 NOTE — Telephone Encounter (Signed)
Rx was filled on 08/23/2018 by provider.

## 2018-08-26 ENCOUNTER — Other Ambulatory Visit: Payer: Self-pay | Admitting: Pharmacist

## 2018-08-26 NOTE — Patient Outreach (Signed)
[facility]) Care Management  08/26/2018  Jeanette Ellis 02/12/1951 [id number]  Patient was called to follow up on medication assistance. HIPAA identifiers were obtained. From chart review and conversation with [name], patient has been approved for Trulicity but we have not heard back from Dr. Creta Levin' office about Basaglar. We requested Toujeo be changed to Basaglar due to medication affordability issues. Since the patient has already been approved for Trulicity, it would be really easy for her to receive long acting insulin therapy at no cost because [name] is available through the same program and Trulicity [employer]).  Patient was asked to give her provider's office a call. An inbasket message will be sent to Dr. Creta Levin.   Plan: Follow up with the patient in 2-3 weeks.   Beecher Mcardle, PharmD, BCACP [facility] Clinical Pharmacist [phone]

## 2018-08-27 ENCOUNTER — Ambulatory Visit: Payer: Self-pay | Admitting: Pharmacist

## 2018-08-28 ENCOUNTER — Other Ambulatory Visit: Payer: Self-pay | Admitting: Family Medicine

## 2018-08-28 ENCOUNTER — Other Ambulatory Visit: Payer: Self-pay | Admitting: Pharmacy Technician

## 2018-08-28 DIAGNOSIS — E785 Hyperlipidemia, unspecified: Secondary | ICD-10-CM

## 2018-08-28 DIAGNOSIS — E119 Type 2 diabetes mellitus without complications: Secondary | ICD-10-CM

## 2018-08-28 MED ORDER — DULAGLUTIDE 0.75 MG/0.5ML ~~LOC~~ SOAJ: 0.7500 mg | SUBCUTANEOUS | 1 refills | Status: DC

## 2018-08-28 MED ORDER — BASAGLAR KWIKPEN 100 UNIT/ML ~~LOC~~ SOPN: 25.0000 [IU] | PEN_INJECTOR | Freq: Every day | SUBCUTANEOUS | 1 refills | Status: DC

## 2018-08-28 NOTE — Telephone Encounter (Signed)
Please let the patient know the trulicity was called in

## 2018-08-28 NOTE — Patient Outreach (Signed)
[facility]) Care Management  08/28/2018  Jeanette Ellis February 01, 1951 [id number]   Incoming call received from Dr Creta Levin in regards to patient's Lilly application for trulicity and [employer].  Dr. Creta Levin was inquiring about the procedure concerning the patient assistance applications. Informed Dr Creta Levin that the scripts needed to be faxed into the patient assistance company. Provided Dr. Creta Levin the fax number. She informed she would take care of this today.  Will followup with Lilly Cares in 1-5 business days to inquire on status of patient's delivery.  Jeanette Ellis, CPhT Musician Care Management [phone]

## 2018-08-28 NOTE — Telephone Encounter (Signed)
Please fax scripts to lilycares  Please call [facility] to cancel refill of trulicity and basaglar that were accidentally sent in  Thank you

## 2018-08-29 ENCOUNTER — Other Ambulatory Visit: Payer: Self-pay | Admitting: Pharmacy Technician

## 2018-08-29 NOTE — Patient Outreach (Signed)
[facility]) Care Management  08/29/2018  ARLEE EVARISTO 07-Aug-1950 [id number]   Care coordination call placed to Lilly in regards to patient's application for Trulicity and Basaglar.  Spoke to [place]  Informed that [name] received the Trulicity script that was faxed in on 5/21/202 but they had a question on the quantity of the Trulicity that needed verification. Chelsea informed they did not receive the [employer]. Chelsea informed the script is in pharmacist call back while awaiting clarification but is the provider would either call in or fix the quantity and refax both scripts then they would help the hold up.  Sent an inbasket message to Dr Creta Levin CMA Wende Neighbors inquiring if she could help with this issue.   Will followup next week with Lilly to see if the issue was resolved.  Yaffa Seckman P. Kayelyn Lemon, CPhT Musician Care Management [phone]

## 2018-09-02 NOTE — Telephone Encounter (Signed)
Please let the [facility] and LILY team know that Trulicity quantity is 63mL with 2 refills.

## 2018-09-02 NOTE — Telephone Encounter (Signed)
Rx faxed on 08/28/2018 and confirmation faxed received. Dgaddy, CMA

## 2018-09-03 ENCOUNTER — Other Ambulatory Visit: Payer: Self-pay | Admitting: Pharmacy Technician

## 2018-09-03 NOTE — Patient Outreach (Signed)
[facility]) Care Management  09/03/2018  MALENI BETKE Aug 15, 1950 [id number]   Care coordination call placed to [facility] in regards to patient's Trulicity and Basaglar application.  Spoke to [place] who said the delivery was pending and the prescriptions were in pharmacist call back at [name].  She called over to [facility] and spoke to [place] who said both prescriptions needed clarifications. She informed that Rodell Perna said both prescriptions were written for 42ml and the smallest order amount to be sent out would be 11ml. She informed the "pharmacist was being picky" and would not allow the release of the prescriptions until the order was clarified to 27ml. She informed it would be best if the provider could call into [facility] and be transferred to [facility] or they could fax it in.  Will outreach our [facility] RPh Nunzio Cobbs for assistance and advice.  Armonee Bojanowski P. Janeisha Ryle, CPhT Musician Care Management [phone]

## 2018-09-04 ENCOUNTER — Other Ambulatory Visit: Payer: Self-pay | Admitting: Pharmacy Technician

## 2018-09-04 ENCOUNTER — Telehealth: Payer: Self-pay

## 2018-09-04 ENCOUNTER — Telehealth: Payer: Self-pay | Admitting: Pharmacist

## 2018-09-04 NOTE — Telephone Encounter (Signed)
Spoke with Catelynn at [employer] who transferred me to [facility] Shanda Bumps) who handles rx's for the pt assistance program.  Called to give clarification and quantity for Trulicity and Hospital doctor.  Trulicity 0.75 mg/0.5 ML- sig: 0.75 ml into skin once per week changed to dispense 4 mL ( 3 month supply with one refill).  Basaglar kwikpen 100unit/ml sig: inject 0.25 mLs into the skin daily.  Qty: 12 mL with one refill.  Per Shanda Bumps pt insulin will be shipped out on tomorrow and will be coming to office [address] [place], [place] [address].  Dr. Creta Levin spoke with Shanda Bumps to make sure calculations and qty's correct.    [place] [phone] Dgaddy, CMA

## 2018-09-04 NOTE — Patient Outreach (Signed)
[facility]) Care Management  09/04/2018  Jeanette Ellis November 02, 1950 [id number]   Received a message from [name], CPhT stating there was an issue with the quanity written on the patient's prescriptions for Trulicity and Basaglar.  [facility] was called. The representative I spoke with was very confused and said she would have a technician call me back.  Plan: Await a call back. Call back to follow up in 5-7 business days.   Beecher Mcardle, PharmD, BCACP [facility] Clinical Pharmacist [phone]

## 2018-09-04 NOTE — Telephone Encounter (Signed)
LVM with on Jeanette Ellis vm pt Trulicity quantity is 4 mL with 2 refills.   Advised to let the [facility] know the trulicity quantity is 4 mL with 2 refills as well.  Advised to contact office with any further questions or concerns. Dgaddy, CMA

## 2018-09-04 NOTE — Patient Outreach (Signed)
[facility]) Care Management  09/04/2018  LUMINA GIANNUZZI 1951/02/28 [id number]    Incoming care coordination call received from Dr Noberto Retort office.  Spoke to [employer]. Informed Delores that Julious Oka was in need of clarification of the Basaglar and Trulciity. Informed Delores it would be best if she could call into [facility] at [phone]. Informed her they would  prefer to hear from someone within the provider's office as opposed to outside. Also informed her that they would prefer a 90 or 120 days supply as the quantity.  Delores verbalized understanding.  Will followup with [facility] in 3-5 business days.  Raiyah Speakman P. Cleone Hulick, CPhT Musician Care Management [phone]

## 2018-09-05 ENCOUNTER — Telehealth: Payer: Self-pay

## 2018-09-05 ENCOUNTER — Other Ambulatory Visit: Payer: Self-pay | Admitting: Pharmacy Technician

## 2018-09-05 DIAGNOSIS — M545 Low back pain: Secondary | ICD-10-CM | POA: Diagnosis not present

## 2018-09-05 DIAGNOSIS — D869 Sarcoidosis, unspecified: Secondary | ICD-10-CM | POA: Diagnosis not present

## 2018-09-05 DIAGNOSIS — R768 Other specified abnormal immunological findings in serum: Secondary | ICD-10-CM | POA: Diagnosis not present

## 2018-09-05 DIAGNOSIS — M150 Primary generalized (osteo)arthritis: Secondary | ICD-10-CM | POA: Diagnosis not present

## 2018-09-05 DIAGNOSIS — E663 Overweight: Secondary | ICD-10-CM | POA: Diagnosis not present

## 2018-09-05 DIAGNOSIS — Z6828 Body mass index (BMI) 28.0-28.9, adult: Secondary | ICD-10-CM | POA: Diagnosis not present

## 2018-09-05 NOTE — Telephone Encounter (Signed)
Diabetic meds delivered for pt by courier.  Call to home number, spoke with husband who is on DPR.  Advised of medication delivery.  Pt will come by for pickup.  Meds placed in medication fridge with paperwork.

## 2018-09-05 NOTE — Patient Outreach (Signed)
[facility]) Care Management  09/05/2018  ENA FRICKEY 1950/04/24 [id number]  Care coordination call placed to [employer] in regards to patient's application for [facility] and Proventil.  Spoke to [place] who informed they had received the application and had mailed out the attestation form on 08/27/2018. She informed it can take between 7-14 business days to arrive at patient's home.  Care coordination call placed to [facility] in regards to patient's application for Trulicity and Hospital doctor. Spoke to [place] who said 3 boxes of Trulicity were delivered to the provider's office today. Tracking number is [id number]. I also  Inquired about [name] and Zollie Scale said the provider would need to fax in an order to [place] at [phone] as she is not enrolled in that program. Informed Zollie Scale that the provider called both [facility] and [facility] yesterday and that [facility] informed both medications were going to be delivered. Zollie Scale did not have an answer for that information. According to provider's notes the medications were delivered today.  Will followup with patient in 3-5 business days to inquire if she has picked up both Trulicity and Hospital doctor and to inform patient about the attestation letter from [employer].  Tashonna Descoteaux P. Rilee Knoll, CPhT Musician Care Management [phone]

## 2018-09-05 NOTE — Telephone Encounter (Signed)
2:15 pm  Pt in office to pickup Trulicity 0.75/0.5 INJ Pen (2 mL pak) DOS 09/04/2018-Qty 12-UOM EA-NDC [id number] and Basaglar 100 Unit INJ Kwikpen ( Pak)-DOS 09/04/2018-QTY 5-UOM EA-NDC [id number].  Pt signed Invoice as proof of pickup.  Medication fro [facility]  [address] [place], [place] [address] [phone]  Dgaddy, CMA

## 2018-09-08 ENCOUNTER — Other Ambulatory Visit: Payer: Self-pay | Admitting: Pharmacy Technician

## 2018-09-08 NOTE — Patient Outreach (Signed)
[facility]) Care Management  09/08/2018  Jeanette Ellis Dec 14, 1950 [id number]    Successful outreach call placed to patient in regards to [employer] application for [employer] and Trulicity.  Spoke to patient, HIPAA identifiers verified.  Patient informed she has picked up her medication from the provider's office. She informed she received 3 boxes of Trulicity and 1 box of Basaglar. Informed patient to call [facility] when she has an approximately 2 week supply left to avoid a delay in therapy. Patient was able to locate the phone number on the prescription label. Confirmed patient had my name and number if any questions or concerns were to arise concerning patient assistance.  Patient inquired if she could use the pen needles she has for the Trulicity for the [employer]. She says she uses 32g, 36mm. After consulting [facility] RPh Tommye Standard, informed patient that this should be ok.  Patient informed she has already received and sent back the attestation letter to [employer]. She believes it was sent back early last week.  Wil followup with [employer] in 7-10 business days to inquire if they have received it back.  Faaris Arizpe P. Honor Fairbank, CPhT Musician Care Management [phone]

## 2018-09-09 ENCOUNTER — Encounter: Payer: Self-pay | Admitting: Family Medicine

## 2018-09-12 ENCOUNTER — Telehealth: Payer: Self-pay | Admitting: Endocrinology

## 2018-09-12 NOTE — Telephone Encounter (Signed)
Called patient to schedule over due follow-up and labs.

## 2018-09-15 ENCOUNTER — Ambulatory Visit: Payer: Self-pay | Admitting: Pharmacist

## 2018-09-19 ENCOUNTER — Other Ambulatory Visit: Payer: Self-pay | Admitting: Pharmacy Technician

## 2018-09-19 NOTE — Patient Outreach (Signed)
[facility]) Care Management  09/19/2018  AREN CHERNE [birth year] [id number]  Care coordination call placed to [employer] patient assistance in regards to patient's application for [facility] and Proventil.  Spoke to [place] who informed patient had been APPROVED 09/16/2018-04/09/2019. Elita Quick informed the request has been sent over to their pharmacy for processing. He informed it should arrive at the patient's home in 10-14 business days.  Will followup with patient in 10-14 business days to inquire if medications were received.  Bessie Boyte P. Izmael Duross, [place] Management [phone]

## 2018-09-26 ENCOUNTER — Encounter: Payer: Self-pay | Admitting: Family Medicine

## 2018-09-26 ENCOUNTER — Telehealth: Payer: Self-pay | Admitting: Family Medicine

## 2018-09-26 NOTE — Telephone Encounter (Signed)
Copied from [place] [phone]. Topic: General - Other >> Sep 26, 2018 12:10 PM Leward Quan A wrote: Reason for CRM: Patient called to say that she was informed by her Pharmacy that metFORMIN (GLUCOPHAGE-XR) 500 MG 24 hr tablet have been recalled. Patient is asking for a call back from Dr in regards to wheat she should do. Ph# [phone]

## 2018-09-26 NOTE — Telephone Encounter (Signed)
Please advise on recall metformin info. Please advise on recommendations.

## 2018-10-01 ENCOUNTER — Other Ambulatory Visit: Payer: Self-pay | Admitting: Pharmacy Technician

## 2018-10-01 ENCOUNTER — Ambulatory Visit: Payer: Self-pay | Admitting: Pharmacist

## 2018-10-01 ENCOUNTER — Other Ambulatory Visit: Payer: Self-pay | Admitting: Pharmacist

## 2018-10-01 NOTE — Patient Outreach (Signed)
[place] [facility]) Care Management  10/01/2018  Jeanette Ellis 1951-01-07 [id number]   Patient was called regarding medication assistance. Unfortunately, she did not answer the phone. HIPAA compliant message was left on the patient's voicemail.  Plan: Call patient back in 4-6 business days.   Elayne Guerin, PharmD, [place] Clinical Pharmacist [phone]

## 2018-10-01 NOTE — Patient Outreach (Signed)
[facility]) Care Management  10/01/2018  Jeanette Ellis May 22, 1950 [id number]  Care coordination call placed to [employer] in regards to patient's delivery status of Dulera and Proventil.  Spoke to [place] who informed the medication was supposed to have been delivered yesterday but that there is conflicting information with the postal service. She provided a tracking number of [id number]. She informed that it may be advisable to call the postal service about the shipment though she could not specify which postal office to call.  Will followup with patient.  Jeanette Ellis P. [name], [place] Management [phone]

## 2018-10-01 NOTE — Telephone Encounter (Signed)
Pt is wanting to know what to do with taking her metformin Please call back [phone]

## 2018-10-01 NOTE — Patient Outreach (Signed)
[place] [facility]) Care Management  10/01/2018  Jeanette Ellis 1950-09-05 [id number]    ADDENDUM  Unsuccessful followup call to patient regarding the delivery shipment of [employer] medications Dulera and Proventil.  Unfortunately patient did not answer the phone, HIPAA compliant voicemail left.  Will followup with patient in 3-5 business days to inquire if she has received the medications.  Proctor Carriker P. Jolan Upchurch, [place] Management [phone]

## 2018-10-06 ENCOUNTER — Other Ambulatory Visit: Payer: Self-pay | Admitting: Pharmacy Technician

## 2018-10-06 ENCOUNTER — Other Ambulatory Visit: Payer: Self-pay | Admitting: Family Medicine

## 2018-10-06 MED ORDER — METFORMIN HCL 500 MG PO TABS: 500.0000 mg | ORAL_TABLET | Freq: Two times a day (BID) | ORAL | 0 refills | Status: DC

## 2018-10-06 NOTE — Telephone Encounter (Signed)
New medication sent to pharmacy

## 2018-10-06 NOTE — Patient Outreach (Signed)
[place] [facility]) Care Management  10/06/2018  Jeanette Ellis Dec 23, 1950 [id number]   Successful outgoing call placed to patient in regards to [employer] application for [facility] and Proventil.  Spoke to patient, HIPAA identifiers verified.  Patient informed she received 1 of each inhaler from the patient assistance company. Discussed refill procedure with patient advising patient to call the company (phone number on label on inhaler box) when she has between 10-14 days supply left. Patient verbalized understanding.  Also discussed the refill procedure again with the patient concerning the [place] and Trulicity with [employer]. Informed patient to call the number on the label of the box of medication again advising to call when between a 10-14 days supply of medication remaining. She inquired about the quantity of medication she received and informed patient the provider called the prescription into [facility] that way.  Patient verbalized understanding.  Patient informed she received a call from her pharmacy informing her that her Metformin had been recalled. She inquired if I knew what it was being changed to. Informed patient that was up to the provider's discretion but I could send the provider a note on her behalf. Patient agreed for me to do this as she said she has called and left 2 messages but has not heard back.  Will route note to provider Delia Chimes concerning the medication recall and will make a courtesy phone call as well on patient behalf.  Will route note to [place] that patient assistance has been completed and will remove myself form care team as patient had no other questions or concerns relating to patient assistance.  Innocence Schlotzhauer P. Raford Brissett, [place] Management [phone]

## 2018-10-06 NOTE — Telephone Encounter (Signed)
Patient called to check the status of what to take other than her metformin.  Patient first reached out 6/19.  Patient call back # [phone]

## 2018-10-06 NOTE — Patient Outreach (Signed)
[facility]) Care Management  10/06/2018  Jeanette Ellis Mar 16, 1951 [id number]    ADDENDUM  Care coordination call placed to Dr. Nolon Rod office.  Spoke to [name]. Informed [name] that patient's Metformin had been recalled and she wanted to know what she should be taking in place of the Metformin. Informed [name] that patient called about a week ago and no one has called her back.  Mary informed she saw the initial note and will send another note to the provider marking it high priority. Mary informed she would have someone call the patient back.  Sibel Khurana P. Katharyn Schauer, [place] Management [phone]

## 2018-11-21 ENCOUNTER — Other Ambulatory Visit: Payer: Self-pay | Admitting: Family Medicine

## 2018-11-21 DIAGNOSIS — E113292 Type 2 diabetes mellitus with mild nonproliferative diabetic retinopathy without macular edema, left eye: Secondary | ICD-10-CM | POA: Diagnosis not present

## 2018-11-21 DIAGNOSIS — Z794 Long term (current) use of insulin: Secondary | ICD-10-CM

## 2018-11-21 DIAGNOSIS — H524 Presbyopia: Secondary | ICD-10-CM | POA: Diagnosis not present

## 2018-11-21 DIAGNOSIS — E119 Type 2 diabetes mellitus without complications: Secondary | ICD-10-CM

## 2018-11-21 DIAGNOSIS — E113211 Type 2 diabetes mellitus with mild nonproliferative diabetic retinopathy with macular edema, right eye: Secondary | ICD-10-CM | POA: Diagnosis not present

## 2018-11-21 LAB — HM DIABETES EYE EXAM

## 2018-12-03 ENCOUNTER — Ambulatory Visit: Payer: Self-pay | Admitting: Pharmacist

## 2018-12-03 ENCOUNTER — Other Ambulatory Visit: Payer: Self-pay | Admitting: Pharmacist

## 2018-12-03 NOTE — Patient Outreach (Signed)
[name]) Care Management  12/03/2018  Jeanette Ellis 02-06-1951 [id number]  Patient was called for follow up. Unfortunately, she did not answer her phone. Her voicemail was full and did not allow for a message to be left.  Plan: Call patient back in 1 month.   Elayne Guerin, PharmD, [place] Clinical Pharmacist [phone]

## 2018-12-05 ENCOUNTER — Other Ambulatory Visit: Payer: Self-pay | Admitting: Family Medicine

## 2018-12-05 DIAGNOSIS — E785 Hyperlipidemia, unspecified: Secondary | ICD-10-CM

## 2018-12-05 NOTE — Telephone Encounter (Signed)
Requested medication (s) are due for refill today:yes  Requested medication (s) are on the active medication list: yes  Last refill:  08/28/2018  Future visit scheduled: no  Notes to clinic:  Ordering provider and pcp are different   Requested Prescriptions  Pending Prescriptions Disp Refills   atorvastatin (LIPITOR) 20 MG tablet [Pharmacy Med Name: Atorvastatin Calcium 20 MG Oral Tablet] 90 tablet 0    Sig: Take 1 tablet by mouth once daily     Cardiovascular:  Antilipid - Statins Passed - 12/05/2018  9:56 AM      Passed - Total Cholesterol in normal range and within 360 days    Cholesterol, Total  Date Value Ref Range Status  01/13/2018 143 100 - 199 mg/dL Final         Passed - LDL in normal range and within 360 days    LDL Calculated  Date Value Ref Range Status  01/13/2018 71 0 - 99 mg/dL Final         Passed - HDL in normal range and within 360 days    HDL  Date Value Ref Range Status  01/13/2018 60 >39 mg/dL Final         Passed - Triglycerides in normal range and within 360 days    Triglycerides  Date Value Ref Range Status  01/13/2018 59 0 - 149 mg/dL Final         Passed - Patient is not pregnant      Passed - Valid encounter within last 12 months    Recent Outpatient Visits          4 months ago Essential hypertension   Primary Care at [facility], Arlie Solomons, MD   5 months ago Medicare annual wellness visit, subsequent   Primary Care at [facility], [place], MD   7 months ago Type 2 diabetes mellitus without complication, with long-term current use of insulin ([place])   Primary Care at [facility], [place], MD   10 months ago Type 2 diabetes mellitus without complication, with long-term current use of insulin [facility])   Primary Care at [place], [place] D, PA-C   1 year ago Bronchitis with bronchospasm   Primary Care at [facility], [place] D, [place]

## 2018-12-17 ENCOUNTER — Encounter: Payer: Self-pay | Admitting: Family Medicine

## 2018-12-18 ENCOUNTER — Other Ambulatory Visit: Payer: Self-pay | Admitting: Family Medicine

## 2018-12-18 ENCOUNTER — Telehealth: Payer: Self-pay | Admitting: Family Medicine

## 2018-12-18 NOTE — Telephone Encounter (Signed)
Error  ° °Not needed °

## 2018-12-18 NOTE — Telephone Encounter (Signed)
Pt called stating she is having trouble getting her insulin and trulicity filled. Pt is requesting to speak with someone because she is almost out and is having trouble from pharmacy. Please advise.

## 2018-12-22 ENCOUNTER — Encounter: Payer: Self-pay | Admitting: Family Medicine

## 2018-12-23 ENCOUNTER — Encounter: Payer: Self-pay | Admitting: Family Medicine

## 2018-12-24 ENCOUNTER — Other Ambulatory Visit: Payer: Self-pay | Admitting: Family Medicine

## 2018-12-24 DIAGNOSIS — E119 Type 2 diabetes mellitus without complications: Secondary | ICD-10-CM

## 2018-12-24 DIAGNOSIS — Z794 Long term (current) use of insulin: Secondary | ICD-10-CM

## 2018-12-24 NOTE — Telephone Encounter (Signed)
Please advise 

## 2018-12-26 ENCOUNTER — Telehealth: Payer: Self-pay

## 2018-12-26 ENCOUNTER — Other Ambulatory Visit: Payer: Self-pay

## 2018-12-26 ENCOUNTER — Encounter: Payer: Self-pay | Admitting: Family Medicine

## 2018-12-26 DIAGNOSIS — Z794 Long term (current) use of insulin: Secondary | ICD-10-CM

## 2018-12-26 DIAGNOSIS — E119 Type 2 diabetes mellitus without complications: Secondary | ICD-10-CM

## 2018-12-26 MED ORDER — TRULICITY 0.75 MG/0.5ML ~~LOC~~ SOAJ: 0.7500 mg | SUBCUTANEOUS | 1 refills | Status: DC

## 2018-12-26 MED ORDER — BASAGLAR KWIKPEN 100 UNIT/ML ~~LOC~~ SOPN: 25.0000 [IU] | PEN_INJECTOR | Freq: Every day | SUBCUTANEOUS | 1 refills | Status: DC

## 2018-12-26 NOTE — Telephone Encounter (Signed)
Pt stated she was speaking with someone about her medications but she can not remember the lady name. Pt stated she needs to speak with her again.

## 2018-12-26 NOTE — Telephone Encounter (Signed)
Patient called in and stated the trulicity and basaglar was almost $100 she could not afford that   Please call patient and adv

## 2018-12-26 NOTE — Telephone Encounter (Signed)
Please advise 

## 2018-12-26 NOTE — Telephone Encounter (Signed)
Spoke with pt advises per [facility] trulicity and basgalar will be $900+ and she can't afford.  Advised will speak with dr Nolon Rod about ? Alternative until pt can get meds from [facility].  Advised will contact her back in awhile pt agreeable. [place]

## 2018-12-26 NOTE — Telephone Encounter (Signed)
Provider sent prescription to [facility] today.

## 2018-12-26 NOTE — Telephone Encounter (Signed)
Provider being made aware.

## 2018-12-26 NOTE — Telephone Encounter (Signed)
Provider sent to [facility] today.

## 2018-12-26 NOTE — Telephone Encounter (Signed)
Pt calling to advise she is out of insulin and [facility] advises dr Nolon Rod will need to send in rx for trulicity and basaglar kwikpen.  Rx's written and faxed to [facility] and also sent meds to [facility] so she will have some until she receives from [place].  Pt agreeable. Dgaddy, CMA

## 2018-12-29 ENCOUNTER — Other Ambulatory Visit: Payer: Self-pay | Admitting: Family Medicine

## 2018-12-29 ENCOUNTER — Other Ambulatory Visit: Payer: Self-pay | Admitting: Pharmacist

## 2018-12-29 DIAGNOSIS — Z794 Long term (current) use of insulin: Secondary | ICD-10-CM

## 2018-12-29 DIAGNOSIS — E119 Type 2 diabetes mellitus without complications: Secondary | ICD-10-CM

## 2018-12-29 NOTE — Patient Outreach (Signed)
[facility]) Care Management  12/29/2018  Jeanette Ellis 08-15-1950 [id number]   Patient was called for CSNP Follow up.  HIPAA identifiers were obtained. Patient said she was feeling "ok" but that she was having issues getting Trulicity and Engineer, agricultural through [employer].    Patient said she was unable to process a refill with [facility] because she was told her application was incomplete. She said she was directed to speak with someone at [facility] and they told her that her provider needed to send new prescriptions to [facility] and to [facility] as they were going to cover a one time fill since she had run out of medication.  Patients' provider sent new prescriptions to [facility] but did not appear to send new prescriptions to [facility].  [facility] was added to the patient's clinical demographics today.  In addition, Dr. Nolon Rod' office was called and a message was left requesting new prescriptions to be sent to [facility]. The person who answered the phone at Dr. Nolon Rod' office said she would re-send the prescriptions to [place].   Plan: Follow up in the next 2-3 business days.   Elayne Guerin, PharmD, [place] Clinical Pharmacist [phone]

## 2018-12-29 NOTE — Telephone Encounter (Signed)
Requested medication (s) are due for refill today -no  Requested medication (s) are on the active medication list -yes  Future visit scheduled - no  Last refill: 9/19  Notes to clinic: Please review for resend to pharmacy- Rx marked at printed.  Requested Prescriptions  Pending Prescriptions Disp Refills   Dulaglutide (TRULICITY) 0.75 MG/0.5ML SOPN 12 pen 1    Sig: Inject 0.75 mg into the skin once a week.     Endocrinology:  Diabetes - GLP-1 Receptor Agonists Passed - 12/29/2018 11:18 AM      Passed - HBA1C is between 0 and 7.9 and within 180 days    Hemoglobin A1C  Date Value Ref Range Status  07/28/2018 6.8 (A) 4.0 - 5.6 % Final         Passed - Valid encounter within last 6 months    Recent Outpatient Visits          5 months ago Essential hypertension   Primary Care at [facility], [place] A, MD   5 months ago Medicare annual wellness visit, subsequent   Primary Care at [facility], [place] A, MD   8 months ago Type 2 diabetes mellitus without complication, with long-term current use of insulin [facility])   Primary Care at [facility], [name], MD   11 months ago Type 2 diabetes mellitus without complication, with long-term current use of insulin [facility])   Primary Care at [place], [place] D, PA-C   1 year ago Bronchitis with bronchospasm   Primary Care at [facility], [place] D, PA-C              Insulin Glargine (BASAGLAR KWIKPEN) 100 UNIT/ML SOPN 12 mL 1    Sig: Inject 0.25 mLs (25 Units total) into the skin daily.     Endocrinology:  Diabetes - Insulins Passed - 12/29/2018 11:18 AM      Passed - HBA1C is between 0 and 7.9 and within 180 days    Hemoglobin A1C  Date Value Ref Range Status  07/28/2018 6.8 (A) 4.0 - 5.6 % Final         Passed - Valid encounter within last 6 months    Recent Outpatient Visits          5 months ago Essential hypertension   Primary Care at [facility], Manus Rudd, MD   5 months ago Medicare annual wellness visit,  subsequent   Primary Care at [facility], [place] A, MD   8 months ago Type 2 diabetes mellitus without complication, with long-term current use of insulin [facility])   Primary Care at [facility], [name], MD   11 months ago Type 2 diabetes mellitus without complication, with long-term current use of insulin [facility])   Primary Care at [place], [place] D, PA-C   1 year ago Bronchitis with bronchospasm   Primary Care at [place], [place] D, PA-C                Requested Prescriptions  Pending Prescriptions Disp Refills   Dulaglutide (TRULICITY) 0.75 MG/0.5ML SOPN 12 pen 1    Sig: Inject 0.75 mg into the skin once a week.     Endocrinology:  Diabetes - GLP-1 Receptor Agonists Passed - 12/29/2018 11:18 AM      Passed - HBA1C is between 0 and 7.9 and within 180 days    Hemoglobin A1C  Date Value Ref Range Status  07/28/2018 6.8 (A) 4.0 - 5.6 % Final  Passed - Valid encounter within last 6 months    Recent Outpatient Visits          5 months ago Essential hypertension   Primary Care at [facility], [place], MD   5 months ago Medicare annual wellness visit, subsequent   Primary Care at [facility], [place], MD   8 months ago Type 2 diabetes mellitus without complication, with long-term current use of insulin [facility])   Primary Care at [place], MD   11 months ago Type 2 diabetes mellitus without complication, with long-term current use of insulin [facility])   Primary Care at [place], [place] D, PA-C   1 year ago Bronchitis with bronchospasm   Primary Care at [place], [place] D, PA-C              Insulin Glargine (BASAGLAR KWIKPEN) 100 UNIT/ML SOPN 12 mL 1    Sig: Inject 0.25 mLs (25 Units total) into the skin daily.     Endocrinology:  Diabetes - Insulins Passed - 12/29/2018 11:18 AM      Passed - HBA1C is between 0 and 7.9 and within 180 days    Hemoglobin A1C  Date Value Ref Range Status  07/28/2018 6.8 (A) 4.0 - 5.6 %  Final         Passed - Valid encounter within last 6 months    Recent Outpatient Visits          5 months ago Essential hypertension   Primary Care at [facility], Arlie Solomons, MD   5 months ago Medicare annual wellness visit, subsequent   Primary Care at [facility], [place], MD   8 months ago Type 2 diabetes mellitus without complication, with long-term current use of insulin [facility])   Primary Care at [place], MD   11 months ago Type 2 diabetes mellitus without complication, with long-term current use of insulin [facility])   Primary Care at [place], [place] D, PA-C   1 year ago Bronchitis with bronchospasm   Primary Care at [facility], [place] D, [place]

## 2018-12-29 NOTE — Telephone Encounter (Signed)
Insulin Glargine (BASAGLAR KWIKPEN) 100 UNIT/ML SOPN  Dulaglutide (TRULICITY) 2.33 AQ/7.6AU SOPN  These were filled but [facility] to  [facility] see note for today from Patient outreach for patient affordability now reroute and send to  [facility] by [facility] - [address] [phone] (Phone) [phone] (Fax)

## 2018-12-30 DIAGNOSIS — H35373 Puckering of macula, bilateral: Secondary | ICD-10-CM | POA: Diagnosis not present

## 2018-12-30 DIAGNOSIS — H35033 Hypertensive retinopathy, bilateral: Secondary | ICD-10-CM | POA: Diagnosis not present

## 2018-12-30 DIAGNOSIS — E113313 Type 2 diabetes mellitus with moderate nonproliferative diabetic retinopathy with macular edema, bilateral: Secondary | ICD-10-CM | POA: Diagnosis not present

## 2018-12-30 DIAGNOSIS — H2513 Age-related nuclear cataract, bilateral: Secondary | ICD-10-CM | POA: Diagnosis not present

## 2018-12-31 ENCOUNTER — Telehealth: Payer: Self-pay

## 2018-12-31 NOTE — Telephone Encounter (Signed)
Package received for diabetic medication.  Phone call to pt to advise.  Pt will come to pick them up today.  Placed meds in immunization fridge with paperwork.

## 2018-12-31 NOTE — Telephone Encounter (Signed)
Pt came into the office to pick up medication that was shipped to our office.

## 2019-01-01 ENCOUNTER — Other Ambulatory Visit: Payer: Self-pay | Admitting: Pharmacy Technician

## 2019-01-01 ENCOUNTER — Other Ambulatory Visit: Payer: Self-pay | Admitting: Pharmacist

## 2019-01-01 NOTE — Patient Outreach (Signed)
[place]) Care Management  01/01/2019  Jeanette Ellis Sep 21, 1950 [id number]   Care coordination call placed to Jeanette Ellis in regards to patient's Lilly refill for Trulicity and [employer].  Spoke to Jeanette Ellis who informed both medications were delivered to the provider's office yesterday.  From chart review, patient picked up the medications from Dr. Nolon Rod' office yesterday.  Jeanette Ellis, [place] Management [phone]

## 2019-01-01 NOTE — Patient Outreach (Signed)
[place] [facility]) Care Management  01/01/2019  Jeanette Ellis [birth year] [id number]   Patient was called to follow up on Trulicity and Basaglar medication assistance. HIPAA identifiers were obtained. From chart review and patient report, she picked up Trulicity and Basaglar from her PCP's office yesterday that was delivered.  Medication list was updated to include Dulera 200/5 2 puffs twice daily and Breo was removed as patient is receiving Dulera from [facility] Patient Assistance Program.  Plan: Follow up with patient in 4-6 weeks.   Elayne Guerin, PharmD, [place] Clinical Pharmacist [phone]

## 2019-01-08 ENCOUNTER — Other Ambulatory Visit: Payer: Self-pay

## 2019-01-08 NOTE — Patient Outreach (Signed)
[place] [facility]) Care Management Chronic Special Needs Program  01/08/2019  Name: Jeanette Ellis DOB: 1951/04/05  MRN: [id number]  Ms. Chris Narasimhan is enrolled in a chronic special needs plan for Diabetes. Reviewed and updated care plan.  Subjective: Client states that she has been trying to walk at least 5 days a week for 30-40 minutes.  States she is eating less bread and  only eating wheat bread.  States she can not find the living will forms sent to her and she would like to talk to someone about completing the forms.  States her blood sugars range 90-120 in the morning and 180-270 after meals   Goals Addressed            This Visit's Progress   .  Acknowledge receipt of Advanced Directive package   Not on track    Package resent    . Advanced Care Planning complete by 9 months( continue10/1/20   On track    [facility] Nurse, children's will contact you    . Client understands the importance of follow-up with providers by attending scheduled visits   On track   . COMPLETED: Client will report abillity to obtain Medications within 3 months       [place] assistance approved    . Client will use Assistive Devices as needed and verbalize understanding of device use   On track   . Client will verbalize knowledge of self management of Hypertension as evidences by BP reading of 140/90 or less; or as defined by provider   On track   . DIET - EAT MORE FRUITS AND VEGETABLES   On track    Increase walking     . HEMOGLOBIN A1C < 7.0       Continue Diabetes self management actions:  Glucose monitoring per provider recommendations  Eat Healthy  Check feet daily  Visit provider every 3-6 months as directed  Hbg A1C level every 3-6 months.  Eye Exam yearly    . Maintain timely refills of diabetic medication as prescribed within the year .   On track   . Obtain annual  Lipid Profile, LDL-C   On track   . COMPLETED: Obtain  Annual Eye (retinal)  Exam        Completed 11/21/18    . Obtain Annual Foot Exam   On track   . Obtain annual screen for micro albuminuria (urine) , nephropathy (kidney problems)   On track   . COMPLETED: Obtain Hemoglobin A1C at least 2 times per year       Completed 04/21/18, 07/28/18    . Visit Primary Care Provider or Endocrinologist at least 2 times per year    On track    Call to schedule follow up with primary care doctor     Client is  meeting diabetes self management goal of hemoglobin A1C of <7% with last reading 6.8% Client agreeable to referral to Social Work for assistance with Advanced Directives  Client has been approved for pharmacy assistance programs to assist with cost of medications Reinforced to follow a low CHO, low sodium diet and to watch portion sizes Reviewed goals for blood sugars and instructed on how her medications effect her blood sugars Encouraged to keep up her walking and instructed on the benefits of exercise to her diabetes and B/P Instructed to call her primary care doctor to schedule a follow up appt Reviewed number for 24-hour nurse Line Reviewed  COVID 19 precautions  Plan:  Send successful outreach letter with a copy of their individualized care plan, Send individual care plan to provider and Send educational material-Advanced Directives   Chronic care management coordinator will outreach in:  4-6 Months  Will refer to:  Social Work   Dudley Major RN, [employer], CDE Chronic Care Producer, television/film/video Care Management [phone]

## 2019-01-08 NOTE — Patient Outreach (Signed)
[place] [facility]) Care Management  01/08/2019  Jeanette Ellis 1950-06-30 [id number]   Social work referral received to assist patient with completion of Advance Directives.  Packet mailed to patient today by RNCM, Peter Garter.  Will contact patient within the next two weeks to ensure receipt and assist with completion.  Ronn Melena, BSW Social Worker [phone]

## 2019-01-22 ENCOUNTER — Other Ambulatory Visit: Payer: Self-pay

## 2019-01-22 NOTE — Patient Outreach (Signed)
[facility]) Care Management  01/22/2019  Jeanette Ellis Jul 25, 1950 [id number]   Successful outreach to patient today, however, she did not receive documentation mailed by [facility], Peter Garter on 01/08/19.  Will resend Engineer, mining and Emmi.  Informed Melissa that care plan was not received.   Ronn Melena, BSW Social Worker [phone]

## 2019-01-24 ENCOUNTER — Other Ambulatory Visit: Payer: Self-pay | Admitting: Family Medicine

## 2019-01-24 DIAGNOSIS — Z794 Long term (current) use of insulin: Secondary | ICD-10-CM

## 2019-01-24 DIAGNOSIS — E119 Type 2 diabetes mellitus without complications: Secondary | ICD-10-CM

## 2019-01-24 NOTE — Telephone Encounter (Signed)
Requested Prescriptions  Pending Prescriptions Disp Refills  . Continuous Blood Gluc Sensor (FREESTYLE LIBRE 14 DAY SENSOR) MISC [Pharmacy Med Name: FREESTYLE LIBRE SENSOR 14D KIT] 2 each 0    Sig: USE AS DIRECTED     There is no refill protocol information for this order

## 2019-02-03 ENCOUNTER — Other Ambulatory Visit: Payer: Self-pay

## 2019-02-03 ENCOUNTER — Encounter: Payer: Self-pay | Admitting: Family Medicine

## 2019-02-03 ENCOUNTER — Ambulatory Visit (INDEPENDENT_AMBULATORY_CARE_PROVIDER_SITE_OTHER): Payer: HMO | Admitting: Family Medicine

## 2019-02-03 VITALS — BP 121/70 | HR 77 | Temp 98.5°F | Wt 197.6 lb

## 2019-02-03 DIAGNOSIS — E11649 Type 2 diabetes mellitus with hypoglycemia without coma: Secondary | ICD-10-CM | POA: Diagnosis not present

## 2019-02-03 DIAGNOSIS — Z23 Encounter for immunization: Secondary | ICD-10-CM

## 2019-02-03 DIAGNOSIS — Z794 Long term (current) use of insulin: Secondary | ICD-10-CM | POA: Diagnosis not present

## 2019-02-03 DIAGNOSIS — D508 Other iron deficiency anemias: Secondary | ICD-10-CM | POA: Diagnosis not present

## 2019-02-03 DIAGNOSIS — I10 Essential (primary) hypertension: Secondary | ICD-10-CM | POA: Diagnosis not present

## 2019-02-03 DIAGNOSIS — Z1231 Encounter for screening mammogram for malignant neoplasm of breast: Secondary | ICD-10-CM | POA: Diagnosis not present

## 2019-02-03 LAB — POCT GLYCOSYLATED HEMOGLOBIN (HGB A1C): Hemoglobin A1C: 7.1 % — AB (ref 4.0–5.6)

## 2019-02-03 LAB — POCT CBC
Granulocyte percent: 68.3 %G (ref 37–80)
HCT, POC: 31.1 % (ref 29–41)
Hemoglobin: 10.4 g/dL — AB (ref 11–14.6)
Lymph, poc: 1.3 (ref 0.6–3.4)
MCH, POC: 27.6 pg (ref 27–31.2)
MCHC: 33.3 g/dL (ref 31.8–35.4)
MCV: 83 fL (ref 76–111)
MID (cbc): 0.2 (ref 0–0.9)
MPV: 8.5 fL (ref 0–99.8)
POC Granulocyte: 3.3 (ref 2–6.9)
POC LYMPH PERCENT: 26.8 %L (ref 10–50)
POC MID %: 4.9 %M (ref 0–12)
Platelet Count, POC: 201 10*3/uL (ref 142–424)
RBC: 3.75 M/uL — AB (ref 4.04–5.48)
RDW, POC: 13.7 %
WBC: 4.9 10*3/uL (ref 4.6–10.2)

## 2019-02-03 MED ORDER — LISINOPRIL 20 MG PO TABS: 20.0000 mg | ORAL_TABLET | Freq: Every day | ORAL | 1 refills | Status: DC

## 2019-02-03 MED ORDER — FERROUS SULFATE 325 (65 FE) MG PO TBEC: 325.0000 mg | DELAYED_RELEASE_TABLET | Freq: Every day | ORAL | 1 refills | Status: DC

## 2019-02-03 NOTE — Progress Notes (Signed)
Established Patient Office Visit  Subjective:  Patient ID: Jeanette Ellis, female    DOB: 06/04/1950  Age: [age] y.o. MRN: [id number]  CC:  Chief Complaint  Patient presents with  . Diabetes    f/u on diabetes and blood pressure would like to get a rx for iron    HPI Jeanette Ellis presents for   Diabetes Diabetes Mellitus:   Jeanette Ellis is here for diabetes follow up. She is taking metformin, trulicity weekly and 22 units of basaglar nightly.  She has been compliant with her meds and is sticking to a diabetic diet. She reports that her sugars are mostly low and at night they are so low the machine can't read them. Sometimes her sugars in the night time are in the 40s and it wakes her from her sleep.  She reports that she rarely gets high sugars only if she has starches like honey cornbread. She has decreased her Basaglar to 22 units from 25 units.   Hypertension: Patient here for follow-up of elevated blood pressure. She is not exercising and is adherent to low salt diet.  Blood pressure is well controlled at home. Cardiac symptoms none. Patient denies chest pressure/discomfort, dyspnea, exertional chest pressure/discomfort and irregular heart beat.  Cardiovascular risk factors: diabetes mellitus and hypertension. Use of agents associated with hypertension: none. History of target organ damage: none. BP Readings from Last 3 Encounters:  02/03/19 121/70  07/28/18 (!) 165/73  04/21/18 132/72   Iron Deficiency Anemia Patient reports that she has not taken her iron lately. She states that she should would like a refill on iron sent to the pharmacy Denies any palpitations or changes to bowel movement Lab Results  Component Value Date   WBC 4.9 02/03/2019   HGB 10.4 (A) 02/03/2019   HCT 31.1 02/03/2019   MCV 83.0 02/03/2019   PLT 233 02/05/2018    Past Medical History:  Diagnosis Date  . Anemia associated with stage 3 chronic renal failure 03/11/2017  . Diabetes  mellitus without complication (HCC)   . High cholesterol   . Hypertension   . Iron deficiency anemia 03/11/2017  . Sarcoidosis     Past Surgical History:  Procedure Laterality Date  . ABDOMINAL HYSTERECTOMY      Family History  Problem Relation Age of Onset  . High blood pressure Mother   . Diabetes Father     Social History   Socioeconomic History  . Marital status: Married    Spouse name: Zynzulu  . Number of children: 4  . Years of education: Not on file  . Highest education level: Not on file  Occupational History  . Not on file  Social Needs  . Financial resource strain: Not on file  . Food insecurity    Worry: Never true    Inability: Never true  . Transportation needs    Medical: No    Non-medical: No  Tobacco Use  . Smoking status: Never Smoker  . Smokeless tobacco: Never Used  Substance and Sexual Activity  . Alcohol use: No  . Drug use: No  . Sexual activity: Not Currently  Lifestyle  . Physical activity    Days per week: Not on file    Minutes per session: Not on file  . Stress: Not on file  Relationships  . Social Musicianconnections    Talks on phone: Not on file    Gets together: Not on file    Attends religious service: Not on file  Active member of club or organization: Not on file    Attends meetings of clubs or organizations: Not on file    Relationship status: Not on file  . Intimate partner violence    Fear of current or ex partner: Not on file    Emotionally abused: Not on file    Physically abused: Not on file    Forced sexual activity: Not on file  Other Topics Concern  . Not on file  Social History Narrative  . Not on file    Outpatient Medications Prior to Visit  Medication Sig Dispense Refill  . albuterol (PROVENTIL HFA;VENTOLIN HFA) 108 (90 Base) MCG/ACT inhaler Inhale 2 puffs into the lungs every 4 (four) hours as needed for wheezing or shortness of breath (cough, shortness of breath or wheezing.). 1 Inhaler 1  . aspirin EC 81  MG tablet Take 81 mg by mouth.    Marland Kitchen atorvastatin (LIPITOR) 20 MG tablet Take 1 tablet by mouth once daily 90 tablet 0  . Continuous Blood Gluc Receiver (FREESTYLE LIBRE 14 DAY READER) DEVI 1 Device by Does not apply route daily as needed. 1 Device 0  . Continuous Blood Gluc Sensor (FREESTYLE LIBRE 14 DAY SENSOR) MISC USE AS DIRECTED 2 each 0  . Dulaglutide (TRULICITY) 0.75 MG/0.5ML SOPN Inject 0.75 mg into the skin once a week. 12 pen 1  . Insulin Glargine (BASAGLAR KWIKPEN) 100 UNIT/ML SOPN Inject 0.25 mLs (25 Units total) into the skin daily. 12 mL 1  . lisinopril (ZESTRIL) 20 MG tablet Take 1 tablet (20 mg total) by mouth daily. 90 tablet 1  . metFORMIN (GLUCOPHAGE) 500 MG tablet TAKE 1 TABLET BY MOUTH TWICE DAILY WITH A MEAL 180 tablet 0  . mometasone-formoterol (DULERA) 200-5 MCG/ACT AERO Inhale 2 puffs into the lungs 2 (two) times daily.    . Multiple Vitamin (MULTIVITAMIN WITH MINERALS) TABS tablet Take 1 tablet by mouth daily.    . ferrous sulfate 325 (65 FE) MG EC tablet Take 325 mg by mouth daily.     No facility-administered medications prior to visit.     No Known Allergies  ROS Review of Systems Review of Systems  Constitutional: Negative for activity change, appetite change, chills and fever.  HENT: Negative for congestion, nosebleeds, trouble swallowing and voice change.   Respiratory: Negative for cough, shortness of breath and wheezing.   Gastrointestinal: Negative for diarrhea, nausea and vomiting.  Genitourinary: Negative for difficulty urinating, dysuria, flank pain and hematuria.  Musculoskeletal: Negative for back pain, joint swelling and neck pain.  Neurological: Negative for dizziness, speech difficulty, light-headedness and numbness.  See HPI. All other review of systems negative.     Objective:    Physical Exam  BP 121/70 (BP Location: Right Arm, Patient Position: Sitting, Cuff Size: Large)   Pulse 77   Temp 98.5 F (36.9 C) (Oral)   Wt 197 lb 9.6 oz  (89.6 kg)   SpO2 97%   BMI 30.95 kg/m  Wt Readings from Last 3 Encounters:  02/03/19 197 lb 9.6 oz (89.6 kg)  07/28/18 195 lb (88.5 kg)  07/07/18 195 lb (88.5 kg)   Diabetic Foot Exam - Simple   Simple Foot Form Diabetic Foot exam was performed with the following findings: Yes 02/03/2019  8:49 AM  Visual Inspection Sensation Testing Pulse Check Comments    Physical Exam  Constitutional: Oriented to person, place, and time. Appears well-developed and well-nourished.  HENT:  Head: Normocephalic and atraumatic.  Eyes: Conjunctivae and EOM are  normal.  Cardiovascular: Normal rate, regular rhythm, normal heart sounds and intact distal pulses.  No murmur heard. Pulmonary/Chest: Effort normal and breath sounds normal. No stridor. No respiratory distress. Has no wheezes.  Abdomen: nondistended, normoactive bs, soft, nontender Neurological: Is alert and oriented to person, place, and time.  Skin: Skin is warm. Capillary refill takes less than 2 seconds.  Psychiatric: Has a normal mood and affect. Behavior is normal. Judgment and thought content normal.    Health Maintenance Due  Topic Date Due  . FOOT EXAM  09/20/2018  . INFLUENZA VACCINE  11/08/2018  . HEMOGLOBIN A1C  01/27/2019    There are no preventive care reminders to display for this patient.  Lab Results  Component Value Date   TSH 3.500 08/07/2017   Lab Results  Component Value Date   WBC 4.9 02/03/2019   HGB 10.4 (A) 02/03/2019   HCT 31.1 02/03/2019   MCV 83.0 02/03/2019   PLT 233 02/05/2018   Lab Results  Component Value Date   NA 144 07/28/2018   K 5.2 07/28/2018   CO2 19 (L) 07/28/2018   GLUCOSE 70 07/28/2018   BUN 11 07/28/2018   CREATININE 0.83 07/28/2018   BILITOT 0.5 07/28/2018   ALKPHOS 62 07/28/2018   AST 33 07/28/2018   ALT 20 07/28/2018   PROT 7.4 07/28/2018   ALBUMIN 4.2 07/28/2018   CALCIUM 9.3 07/28/2018   ANIONGAP 8 02/05/2018   Lab Results  Component Value Date   CHOL 143  01/13/2018   Lab Results  Component Value Date   HDL 60 01/13/2018   Lab Results  Component Value Date   LDLCALC 71 01/13/2018   Lab Results  Component Value Date   TRIG 59 01/13/2018   Lab Results  Component Value Date   CHOLHDL 2.4 01/13/2018   Lab Results  Component Value Date   HGBA1C 7.1 (A) 02/03/2019      Assessment & Plan:   Problem List Items Addressed This Visit      Cardiovascular and Mediastinum   Essential hypertension - refilled lisinopril Discussed bp goals     Endocrine   Type 2 diabetes mellitus without complication, with long-term current use of insulin ([place]) - Primary well controlled hemoglobin a1c is at goal Continue exercise Lipids monitored and renal function in range On metformin, trulicity, insulin glargine On ace inhibitor and lisinopril On asa 81mg  Reviewed diabetic foot care Emphasized importance of eye and dental exam        Other   Iron deficiency anemia (Chronic)- discussed iron supplementation and reviewed diet   Relevant Medications   ferrous sulfate 325 (65 FE) MG EC tablet   Other Relevant Orders   POCT CBC (Completed)    Other Visit Diagnoses    Need for prophylactic vaccination and inoculation against influenza       Relevant Orders   Flu Vaccine QUAD High Dose(Fluad) (Completed)      Meds ordered this encounter  Medications  . ferrous sulfate 325 (65 FE) MG EC tablet    Sig: Take 1 tablet (325 mg total) by mouth daily.    Dispense:  90 tablet    Refill:  1    Follow-up: Return in about 3 months (around 05/06/2019) for diabetes and anemia.    Forrest Moron, MD

## 2019-02-03 NOTE — Patient Instructions (Addendum)
Decrease your insulin to 20 units in the evening and have a small nutritious snack before bed to decrease your episodes of hypoglycemia   If you have lab work done today you will be contacted with your lab results within the next 2 weeks.  If you have not heard from [place] then please contact us. The fastest way to get your results is to register for My Chart.   IF you received an x-ray today, you will receive an invoice from [facility]. Please contact [facility] at [phone] with questions or concerns regarding your invoice.   IF you received labwork today, you will receive an invoice from [place]. Please contact [facility] at [phone] with questions or concerns regarding your invoice.   Our billing staff will not be able to assist you with questions regarding bills from these companies.  You will be contacted with the lab results as soon as they are available. The fastest way to get your results is to activate your My Chart account. Instructions are located on the last page of this paperwork. If you have not heard from [place] regarding the results in 2 weeks, please contact this office.      Hypoglycemia Hypoglycemia occurs when the level of sugar (glucose) in the blood is too low. Hypoglycemia can happen in people who do or do not have diabetes. It can develop quickly, and it can be a medical emergency. For most people with diabetes, a blood glucose level below 70 mg/dL (3.9 mmol/L) is considered hypoglycemia. Glucose is a type of sugar that provides the body's main source of energy. Certain hormones (insulin and glucagon) control the level of glucose in the blood. Insulin lowers blood glucose, and glucagon raises blood glucose. Hypoglycemia can result from having too much insulin in the bloodstream, or from not eating enough food that contains glucose. You may also have reactive hypoglycemia, which happens within 4 hours after eating a meal. What are the  causes? Hypoglycemia occurs most often in people who have diabetes and may be caused by:  Diabetes medicine.  Not eating enough, or not eating often enough.  Increased physical activity.  Drinking alcohol on an empty stomach. If you do not have diabetes, hypoglycemia may be caused by:  A tumor in the pancreas.  Not eating enough, or not eating for long periods at a time (fasting).  A severe infection or illness.  Certain medicines. What increases the risk? Hypoglycemia is more likely to develop in:  People who have diabetes and take medicines to lower blood glucose.  People who abuse alcohol.  People who have a severe illness. What are the signs or symptoms? Mild symptoms Mild hypoglycemia may not cause any symptoms. If you do have symptoms, they may include:  Hunger.  Anxiety.  Sweating and feeling clammy.  Dizziness or feeling light-headed.  Sleepiness.  Nausea.  Increased heart rate.  Headache.  Blurry vision.  Irritability.  Tingling or numbness around the mouth, lips, or tongue.  A change in coordination.  Restless sleep. Moderate symptoms Moderate hypoglycemia can cause:  Mental confusion and poor judgment.  Behavior changes.  Weakness.  Irregular heartbeat. Severe symptoms Severe hypoglycemia is a medical emergency. It can cause:  Fainting.  Seizures.  Loss of consciousness (coma).  Death. How is this diagnosed? Hypoglycemia is diagnosed with a blood test to measure your blood glucose level. This blood test is done while you are having symptoms. Your health care provider may also do a physical exam and review your  medical history. How is this treated? This condition can often be treated by immediately eating or drinking something that contains sugar, such as:  Fruit juice, 4-6 oz (120-150 mL).  Regular soda (not diet soda), 4-6 oz (120-150 mL).  Low-fat milk, 4 oz (120 mL).  Several pieces of hard candy.  Sugar or honey,  1 Tbsp (15 mL). Treating hypoglycemia if you have diabetes If you are alert and able to swallow safely, follow the 15:15 rule:  Take 15 grams of a rapid-acting carbohydrate. Talk with your health care provider about how much you should take.  Rapid-acting options include: ? Glucose pills (take 15 grams). ? 6-8 pieces of hard candy. ? 4-6 oz (120-150 mL) of fruit juice. ? 4-6 oz (120-150 mL) of regular (not diet) soda. ? 1 Tbsp (15 mL) honey or sugar.  Check your blood glucose 15 minutes after you take the carbohydrate.  If the repeat blood glucose level is still at or below 70 mg/dL (3.9 mmol/L), take 15 grams of a carbohydrate again.  If your blood glucose level does not increase above 70 mg/dL (3.9 mmol/L) after 3 tries, seek emergency medical care.  After your blood glucose level returns to normal, eat a meal or a snack within 1 hour.  Treating severe hypoglycemia Severe hypoglycemia is when your blood glucose level is at or below 54 mg/dL (3 mmol/L). Severe hypoglycemia is a medical emergency. Get medical help right away. If you have severe hypoglycemia and you cannot eat or drink, you may need an injection of glucagon. A family member or close friend should learn how to check your blood glucose and how to give you a glucagon injection. Ask your health care provider if you need to have an emergency glucagon injection kit available. Severe hypoglycemia may need to be treated in a hospital. The treatment may include getting glucose through an IV. You may also need treatment for the cause of your hypoglycemia. Follow these instructions at home:  General instructions  Take over-the-counter and prescription medicines only as told by your health care provider.  Monitor your blood glucose as told by your health care provider.  Limit alcohol intake to no more than 1 drink a day for nonpregnant women and 2 drinks a day for men. One drink equals 12 oz of beer (355 mL), 5 oz of wine (148  mL), or 1 oz of hard liquor (44 mL).  Keep all follow-up visits as told by your health care provider. This is important. If you have diabetes:  Always have a rapid-acting carbohydrate snack with you to treat low blood glucose.  Follow your diabetes management plan as directed. Make sure you: ? Know the symptoms of hypoglycemia. It is important to treat it right away to prevent it from becoming severe. ? Take your medicines as directed. ? Follow your exercise plan. ? Follow your meal plan. Eat on time, and do not skip meals. ? Check your blood glucose as often as directed. Always check before and after exercise. ? Follow your sick day plan whenever you cannot eat or drink normally. Make this plan in advance with your health care provider.  Share your diabetes management plan with people in your workplace, school, and household.  Check your urine for ketones when you are ill and as told by your health care provider.  Carry a medical alert card or wear medical alert jewelry. Contact a health care provider if:  You have problems keeping your blood glucose in your target  range.  You have frequent episodes of hypoglycemia. Get help right away if:  You continue to have hypoglycemia symptoms after eating or drinking something containing glucose.  Your blood glucose is at or below 54 mg/dL (3 mmol/L).  You have a seizure.  You faint. These symptoms may represent a serious problem that is an emergency. Do not wait to see if the symptoms will go away. Get medical help right away. Call your local emergency services (911 in the [place] Summary  Hypoglycemia occurs when the level of sugar (glucose) in the blood is too low.  Hypoglycemia can happen in people who do or do not have diabetes. It can develop quickly, and it can be a medical emergency.  Make sure you know the symptoms of hypoglycemia and how to treat it.  Always have a rapid-acting carbohydrate snack with you to treat low  blood sugar. This information is not intended to replace advice given to you by your health care provider. Make sure you discuss any questions you have with your health care provider. Document Released: 03/26/2005 Document Revised: 09/16/2017 Document Reviewed: 04/29/2015 Elsevier Patient Education  2020 [employer].

## 2019-02-04 LAB — COMPREHENSIVE METABOLIC PANEL
ALT: 25 IU/L (ref 0–32)
AST: 34 IU/L (ref 0–40)
Albumin/Globulin Ratio: 1.4 (ref 1.2–2.2)
Albumin: 4.1 g/dL (ref 3.8–4.8)
Alkaline Phosphatase: 64 IU/L (ref 39–117)
BUN/Creatinine Ratio: 12 (ref 12–28)
BUN: 11 mg/dL (ref 8–27)
Bilirubin Total: 0.7 mg/dL (ref 0.0–1.2)
CO2: 24 mmol/L (ref 20–29)
Calcium: 9.5 mg/dL (ref 8.7–10.3)
Chloride: 104 mmol/L (ref 96–106)
Creatinine, Ser: 0.94 mg/dL (ref 0.57–1.00)
GFR calc Af Amer: 71 mL/min/{1.73_m2} (ref >59)
GFR calc non Af Amer: 62 mL/min/{1.73_m2} (ref >59)
Globulin, Total: 2.9 g/dL (ref 1.5–4.5)
Glucose: 81 mg/dL (ref 65–99)
Potassium: 4.6 mmol/L (ref 3.5–5.2)
Sodium: 142 mmol/L (ref 134–144)
Total Protein: 7 g/dL (ref 6.0–8.5)

## 2019-02-04 LAB — LIPID PANEL
Chol/HDL Ratio: 2 ratio (ref 0.0–4.4)
Cholesterol, Total: 138 mg/dL (ref 100–199)
HDL: 68 mg/dL (ref >39)
LDL Chol Calc (NIH): 54 mg/dL (ref 0–99)
Triglycerides: 82 mg/dL (ref 0–149)
VLDL Cholesterol Cal: 16 mg/dL (ref 5–40)

## 2019-02-05 ENCOUNTER — Other Ambulatory Visit: Payer: Self-pay

## 2019-02-05 NOTE — Patient Outreach (Signed)
[place] [facility]) Care Management  02/05/2019  MAKYNLEE KRESSIN 29-Oct-1950 [id number]   Successful follow up call to patient today.  She confirmed receipt of Advance Directive Emmi and packet mailed on 01/22/19, however she has not thoroughly reviewed documentation.  Patient stated that she would call if she needs assistance with completion of documents.  Closing Social Work case at this time.  Ronn Melena, BSW Social Worker [phone]

## 2019-02-12 IMAGING — CT CT CHEST HIGH RESOLUTION W/O CM
2 of 5 series · 15 of 36 positions shown, 18 images · non-contrast
Comparison: 01/09/2016, 07/12/2015 and 10/06/2008.

CLINICAL DATA: Extreme shortness of breath.  Sarcoid.

EXAM:
CT CHEST WITHOUT CONTRAST
TECHNIQUE: Multidetector CT imaging of the chest was performed following the
standard protocol without intravenous contrast. High resolution
imaging of the lungs, as well as inspiratory and expiratory imaging,
was performed.

[Series 2: thorax · axial · 0.65mm/px · z∈[-386,-94]mm · 12 of 162 slices shown, 15 images]
[im 8/162  mediastinal]
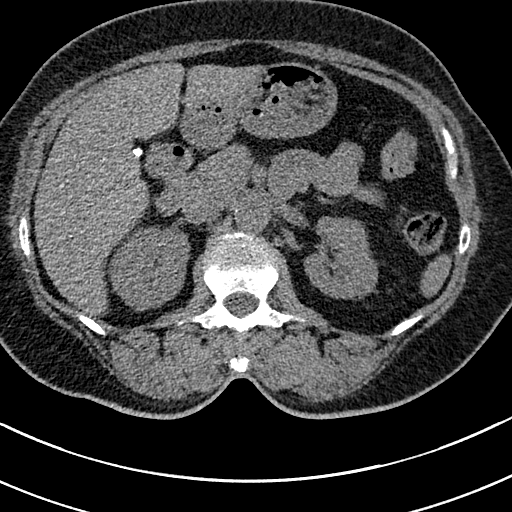
[im 8/162  lung]
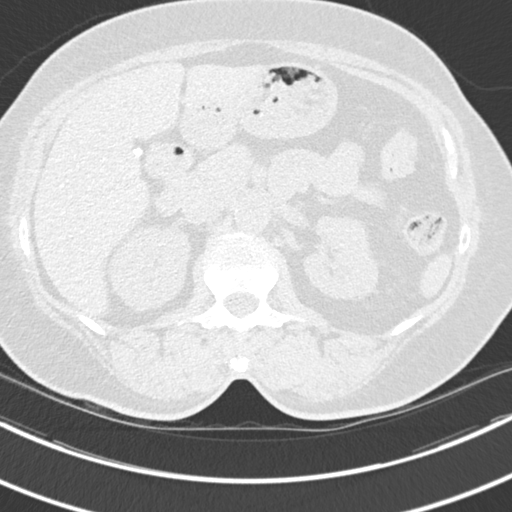
[im 24/162  lung]
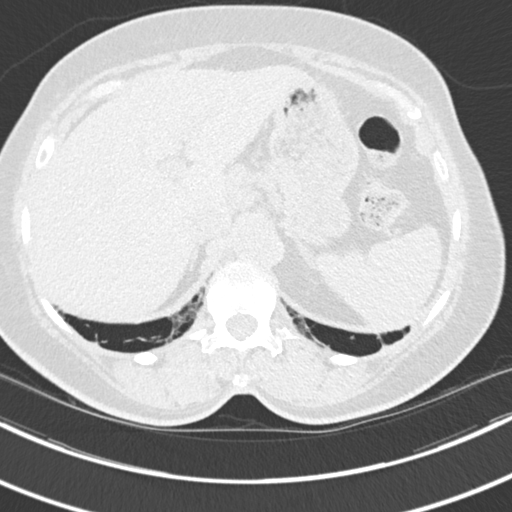
[im 39/162  lung]
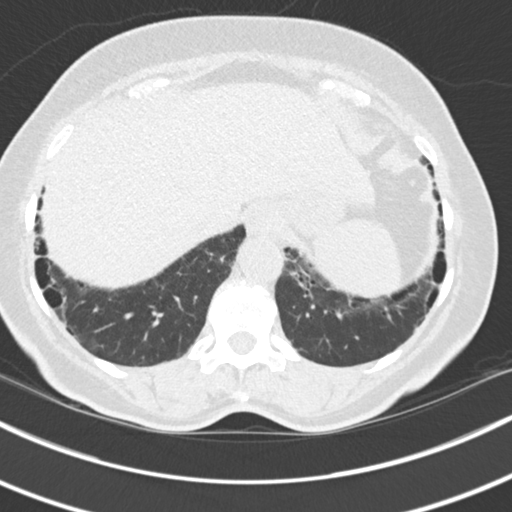
[im 47/162  lung]
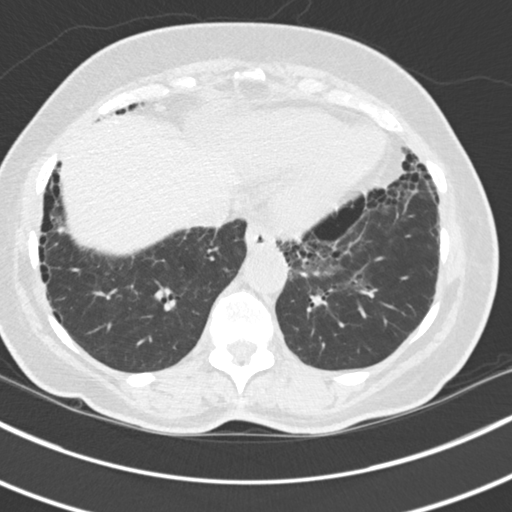
[im 62/162  mediastinal]
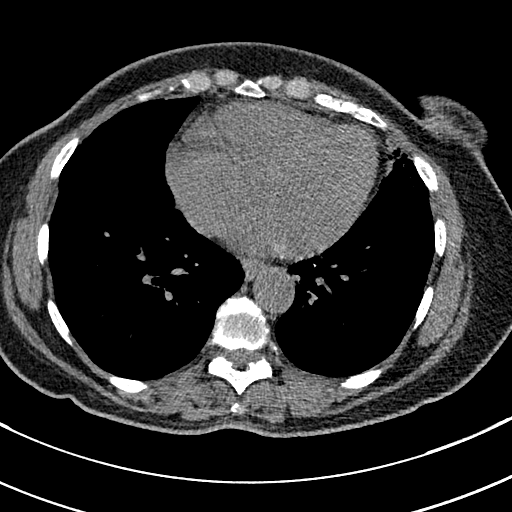
[im 62/162  lung]
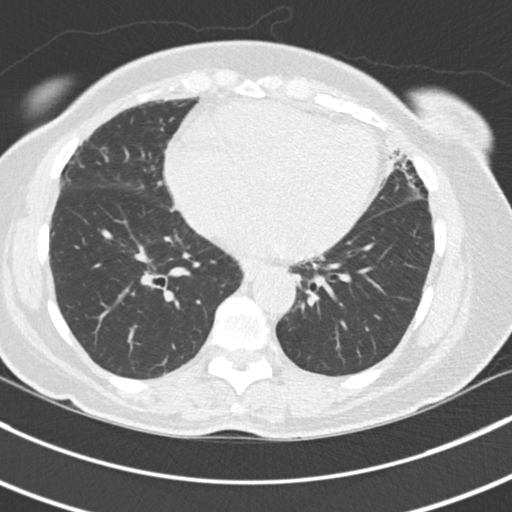
[im 77/162  lung]
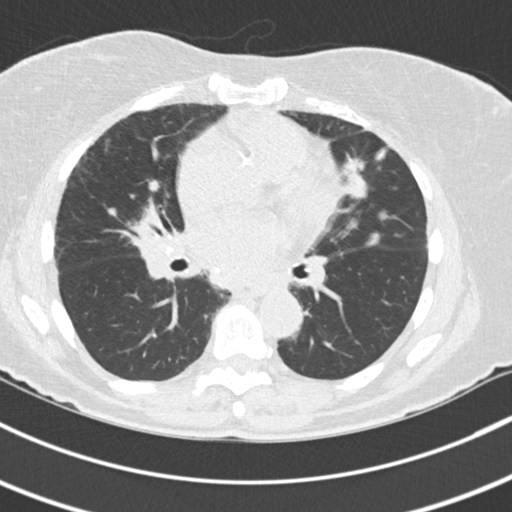
[im 85/162  lung]
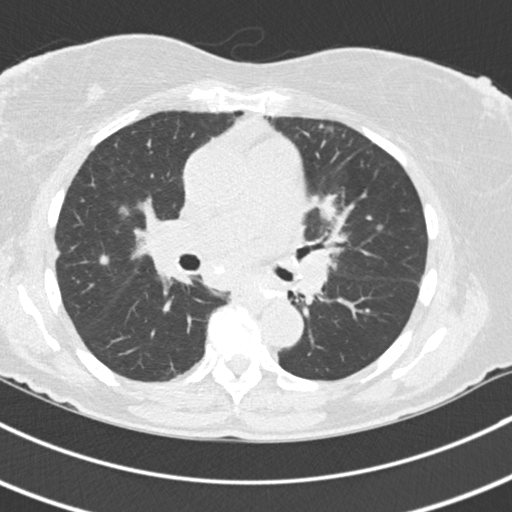
[im 100/162  lung]
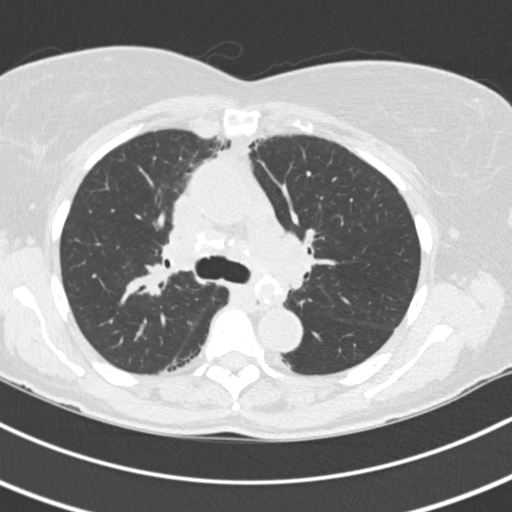
[im 116/162  mediastinal]
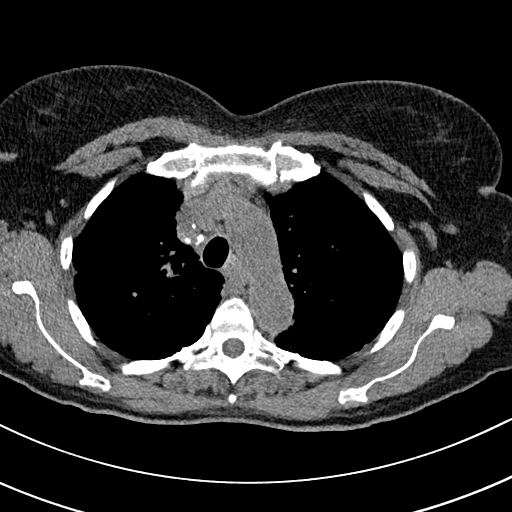
[im 116/162  lung]
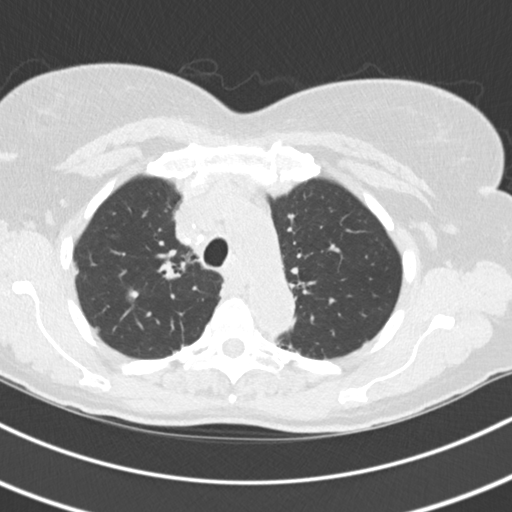
[im 123/162  lung]
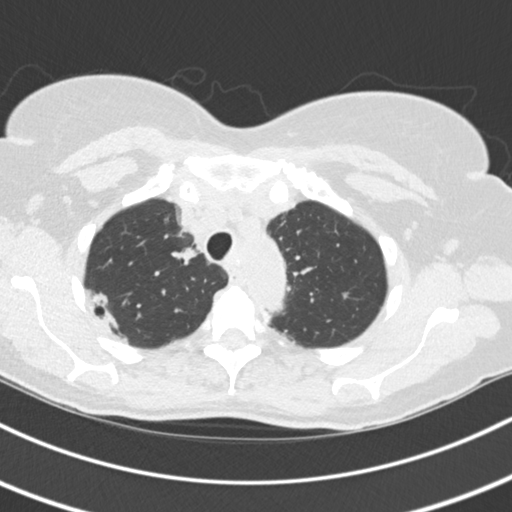
[im 139/162  lung]
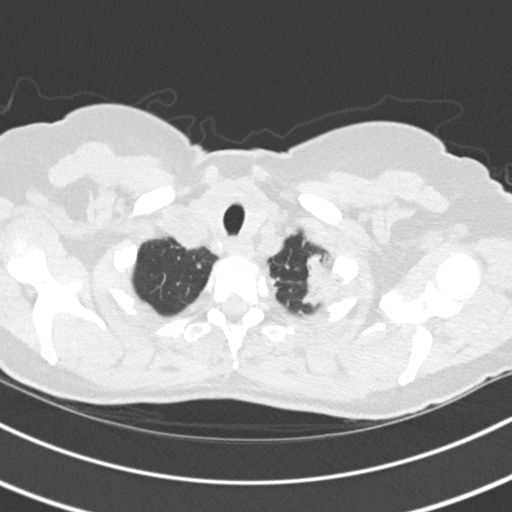
[im 154/162  lung]
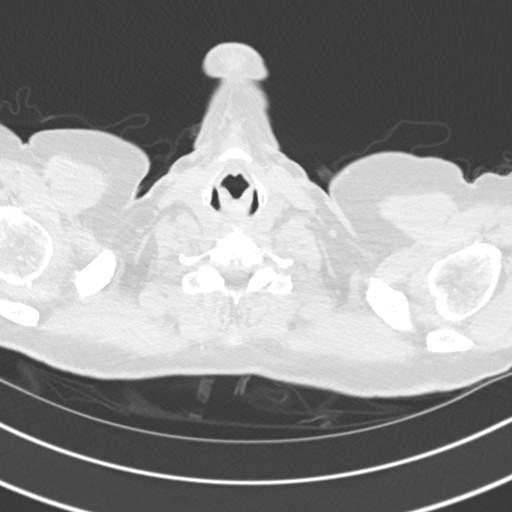

[Series 8: coronal · coronal · 0.64mm/px · 3 of 142 slices shown]
[im 29/142  lung]
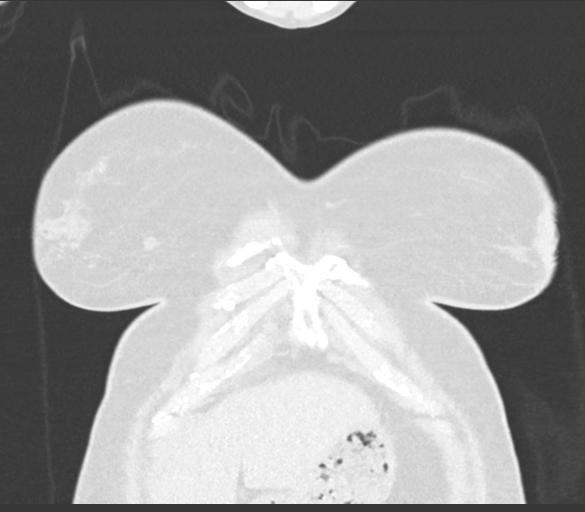
[im 57/142  lung]
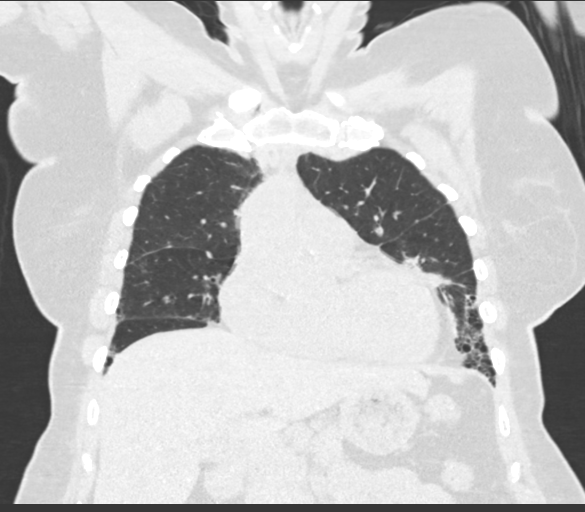
[im 85/142  lung]
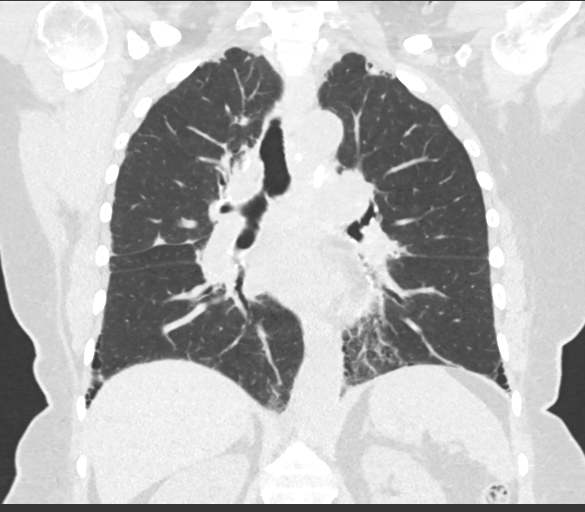

[15 of 36 positions shown; findings below may reference images not displayed]

FINDINGS: Cardiovascular: Atherosclerotic calcification of the arterial
vasculature. Pulmonary arteries and heart are enlarged. No
pericardial effusion.

Mediastinum/Nodes: Calcified bulky mediastinal and hilar lymph
nodes. Partially calcified subcentimeter short axis prepericardiac
lymph nodes. Axillary lymph nodes are not enlarged by CT size
criteria. Esophagus is grossly unremarkable.

Lungs/Pleura: Biapical pleuroparenchymal scarring. Perihilar
confluent soft tissue, bronchiectasis and peribronchovascular
nodularity. Multiple scattered pulmonary nodules measure up to 8 mm
in the apical segment right upper lobe, as before. Mild scattered
bronchiolectasis. Overall severely and distribution of the findings
appear stable from 07/12/2015. There are nodules along the fissures.
Basilar predominant emphysema with probable honeycombing. No pleural
fluid. Small diverticulum is seen off the lower trachea. Adherent
debris is seen within the airway. No air trapping.

Upper Abdomen: Visualized portions of the liver, adrenal glands,
kidneys, spleen, pancreas, stomach and bowel are grossly
unremarkable. Cholecystectomy. Upper abdominal lymph nodes measure
up to approximately 11 mm in the gastrohepatic ligament, similar.

Musculoskeletal: Degenerative changes in the spine. No worrisome
lytic or sclerotic lesions.
IMPRESSION: 1. Calcified mediastinal and hilar lymph nodes, together with
pulmonary parenchymal pattern of perihilar soft tissue confluence
and perilymphatic nodularity, stable from 07/12/2015 and indicative
of sarcoid.
2. Basilar predominant emphysema (ZWGSI-QK5.W) and
honeycombing/fibrosis.
3. Aortic atherosclerosis (ZWGSI-170.0).
4. Enlarged pulmonary arteries, indicative of pulmonary arterial
hypertension.

## 2019-02-21 ENCOUNTER — Other Ambulatory Visit: Payer: Self-pay | Admitting: Family Medicine

## 2019-02-21 DIAGNOSIS — Z794 Long term (current) use of insulin: Secondary | ICD-10-CM

## 2019-02-21 DIAGNOSIS — E119 Type 2 diabetes mellitus without complications: Secondary | ICD-10-CM

## 2019-03-10 ENCOUNTER — Other Ambulatory Visit: Payer: Self-pay | Admitting: Pharmacist

## 2019-03-10 ENCOUNTER — Other Ambulatory Visit: Payer: Self-pay | Admitting: Family Medicine

## 2019-03-10 DIAGNOSIS — Z794 Long term (current) use of insulin: Secondary | ICD-10-CM

## 2019-03-10 DIAGNOSIS — E119 Type 2 diabetes mellitus without complications: Secondary | ICD-10-CM

## 2019-03-10 NOTE — Patient Outreach (Addendum)
[place] [facility]) Care Management  [place]   03/10/2019  SREEJA SPIES 04-08-1951 [id number]  Reason for referral: medication assistance  Referral source: Patient Referral medication(s): Trulicity/Basaglar Current insurance: HTA--will be switching to Aetna for 2021  HPI:  Patient is a 68 year old female with multiple medical conditions including but not limited to:  CKD stage III, type 2 diabetes, hypertension, hyperlipidemia.   Objective: No Known Allergies  Medications Reviewed Today    Reviewed by Teresita Madura, RMA (Registered Medical Assistant) on 02/03/19 at [phone]  Med List Status: <None>  Medication Order Taking? Sig Documenting Provider Last Dose Status Informant  albuterol (PROVENTIL HFA;VENTOLIN HFA) 108 (90 Base) MCG/ACT inhaler [id number] Yes Inhale 2 puffs into the lungs every 4 (four) hours as needed for wheezing or shortness of breath (cough, shortness of breath or wheezing.). Tenna Delaine D, PA-C Taking Active   aspirin EC 81 MG tablet [id number] Yes Take 81 mg by mouth. [provider] Taking Active   atorvastatin (LIPITOR) 20 MG tablet [id number] Yes Take 1 tablet by mouth once daily Forrest Moron, MD Taking Active   Continuous Blood Gluc Receiver (FREESTYLE LIBRE 14 DAY READER) DEVI [id number] Yes 1 Device by Does not apply route daily as needed. Tenna Delaine D, PA-C Taking Active   Continuous Blood Gluc Sensor (FREESTYLE LIBRE 14 DAY SENSOR) [place] [id number] Yes USE AS DIRECTED Forrest Moron, MD Taking Active   Dulaglutide (TRULICITY) 5.42 HC/6.2BJ SOPN [id number] Yes Inject 0.75 mg into the skin once a week. Forrest Moron, MD Taking Active   ferrous sulfate 325 (65 FE) MG EC tablet [id number] Yes Take 325 mg by mouth daily. [provider] Taking Active   Insulin Glargine [facility] KWIKPEN) 100 UNIT/ML SOPN [id number] Yes Inject 0.25 mLs (25 Units total) into the skin daily. Forrest Moron, MD Taking Active    lisinopril (ZESTRIL) 20 MG tablet [id number] Yes Take 1 tablet (20 mg total) by mouth daily. Forrest Moron, MD Taking Active   metFORMIN (GLUCOPHAGE) 500 MG tablet [id number] Yes TAKE 1 TABLET BY MOUTH TWICE DAILY WITH A MEAL [name], [name], MD Taking Active   mometasone-formoterol [facility]) 200-5 MCG/ACT AERO [id number] Yes Inhale 2 puffs into the lungs 2 (two) times daily. [provider] Taking Active            Med Note Regino Bellow Jan 01, 2019  9:55 AM) Frances [place]wood from [employer] Patient Assistance program substituted for [employer]  Multiple Vitamin (MULTIVITAMIN WITH MINERALS) TABS tablet [id number] Yes Take 1 tablet by mouth daily. [provider] Taking Active           Assessment: HgA1c- 7.1% (01/2019) On statin-Atorvastatin-last filled #90 12/05/2018  Patient received Ruthe Mannan, Proventil HFA, Basaglar, and Trulicity from Patient Assistance Programs for 2020.  [facility] was called and orders were placed for Trulicity and Basaglar refills.  She will be sent new applications for [id number]  Plan: I will route patient assistance letter to [place] technician who will coordinate patient assistance program application process for medications listed above.  [facility] pharmacy technician will assist with obtaining all required documents from both patient and provider(s) and submit application(s) once completed.     Elayne Guerin, PharmD, [place] Clinical Pharmacist [phone]

## 2019-03-12 ENCOUNTER — Ambulatory Visit: Payer: HMO | Admitting: Pharmacist

## 2019-03-12 ENCOUNTER — Other Ambulatory Visit: Payer: Self-pay | Admitting: Pharmacy Technician

## 2019-03-12 NOTE — Patient Outreach (Addendum)
[place] [facility]) Care Management  03/12/2019  Jeanette Ellis 1950-10-12 [id number]                                        Medication Assistance Referral  Referral From: [place]  Medication/Company: Ruthe Mannan and Proventil / [employer] Patient application portion:  Education officer, museum portion: Magazine features editor to Dr. Delia Chimes Provider address/fax verified via: Office website  Medication/Company: Danelle Berry and Engineer, agricultural / [place] Patient application portion:  Mailed Provider application portion: Faxed  to Dr. Delia Chimes Provider address/fax verified via: Office website   This is for 2021 patient assistance   Follow up:  Will follow up with patient in 15-30 days to confirm application(s) have been received.  Jeanette Ellis, [place] Management [phone]

## 2019-03-13 ENCOUNTER — Other Ambulatory Visit: Payer: Self-pay | Admitting: Family Medicine

## 2019-03-13 DIAGNOSIS — E785 Hyperlipidemia, unspecified: Secondary | ICD-10-CM

## 2019-03-18 DIAGNOSIS — D869 Sarcoidosis, unspecified: Secondary | ICD-10-CM | POA: Diagnosis not present

## 2019-03-18 DIAGNOSIS — M150 Primary generalized (osteo)arthritis: Secondary | ICD-10-CM | POA: Diagnosis not present

## 2019-03-18 DIAGNOSIS — M545 Low back pain: Secondary | ICD-10-CM | POA: Diagnosis not present

## 2019-03-18 DIAGNOSIS — R768 Other specified abnormal immunological findings in serum: Secondary | ICD-10-CM | POA: Diagnosis not present

## 2019-03-27 DIAGNOSIS — H35033 Hypertensive retinopathy, bilateral: Secondary | ICD-10-CM | POA: Diagnosis not present

## 2019-03-27 DIAGNOSIS — E113313 Type 2 diabetes mellitus with moderate nonproliferative diabetic retinopathy with macular edema, bilateral: Secondary | ICD-10-CM | POA: Diagnosis not present

## 2019-03-27 DIAGNOSIS — H2513 Age-related nuclear cataract, bilateral: Secondary | ICD-10-CM | POA: Diagnosis not present

## 2019-03-27 DIAGNOSIS — H35373 Puckering of macula, bilateral: Secondary | ICD-10-CM | POA: Diagnosis not present

## 2019-04-07 ENCOUNTER — Other Ambulatory Visit: Payer: Self-pay | Admitting: Pharmacy Technician

## 2019-04-07 NOTE — Patient Outreach (Signed)
[place] [facility]) Care Management  04/07/2019  Jeanette Ellis 12/08/1950 [id number]     Successful call placed to patient regarding patient assistance application(s) for [facility] and Proventil HFA with [employer] and Lobbyist with [employer] , HIPAA identifiers verified.   Patient confirmed receiving the applications. Patient is still working on the applications and plans to have them in the mail in the next few days.  Follow up:  Will route note to [place] for case closure if document(s) have not been received in the next 15 business days.  Roberts Bon P. [name], [place] Management [phone]

## 2019-04-08 ENCOUNTER — Ambulatory Visit: Payer: HMO | Attending: Internal Medicine

## 2019-04-08 DIAGNOSIS — Z20822 Contact with and (suspected) exposure to covid-19: Secondary | ICD-10-CM

## 2019-04-09 LAB — NOVEL CORONAVIRUS, NAA: SARS-CoV-2, NAA: DETECTED — AB

## 2019-04-17 ENCOUNTER — Other Ambulatory Visit: Payer: Self-pay

## 2019-04-17 NOTE — Patient Outreach (Signed)
  [facility]) Care Management Chronic Special Needs Program    04/17/2019  Name: Jeanette Ellis, DOB: June 25, 1950  MRN: [id number]   [name] is enrolled in a chronic special needs plan for Diabetes.  Case closed as client has disenrolled from Chronic Special Needs Plan Plan to send case closure letter to [facility] [employer] ([employer]A) will notify client of dis-enrollment from plan  Dudley Major RN, [facility], CDE Chronic Care Management Coordinator [facility] Care Management [phone]

## 2019-04-21 ENCOUNTER — Other Ambulatory Visit: Payer: Self-pay

## 2019-04-21 DIAGNOSIS — E119 Type 2 diabetes mellitus without complications: Secondary | ICD-10-CM

## 2019-04-21 MED ORDER — FREESTYLE LIBRE 14 DAY READER DEVI: 1.0000 | Freq: Every day | 0 refills | Status: DC | PRN

## 2019-04-30 ENCOUNTER — Telehealth: Payer: Self-pay | Admitting: Family Medicine

## 2019-04-30 NOTE — Telephone Encounter (Signed)
Continuous Blood Gluc Receiver (FREESTYLE LIBRE 14 DAY READER) DEVI  Pt needs a PA for this as hers is broken and can not check her blood sugars,  Send to [employer] [facility] - [place], [place] - [address] Phone:  [phone]  Fax:  [phone]

## 2019-04-30 NOTE — Telephone Encounter (Signed)
Pt requesting PA to get another Continuous Blood Gluc Receiver (FREESTYLE LIBRE 14 DAY READER) DEVI. Hers is broken.

## 2019-05-01 NOTE — Telephone Encounter (Signed)
I can only process PA requests for prescription. Not equipment

## 2019-05-01 NOTE — Telephone Encounter (Signed)
Please advise 

## 2019-05-06 ENCOUNTER — Other Ambulatory Visit: Payer: Self-pay | Admitting: Pharmacist

## 2019-05-06 NOTE — Telephone Encounter (Signed)
Who can help with equipment Pa's?

## 2019-05-06 NOTE — Patient Outreach (Signed)
[facility]) Care Management  05/06/2019  Jeanette Ellis 03/30/1951 [id number]   Patient was called regarding medication assistance. HIPAA identifiers were obtained.   Patient now has [employer]. She is a 69 year old female with multiple medical conditions including but not limited to:  Anemia, type 2 diabetes, hypertension, history of sarcoidosis and hyperlipidemia.  Medications Reviewed Today    Reviewed by Beecher Mcardle, [facility] (Pharmacist) on 05/06/19 at 1101  Med List Status: <None>  Medication Order Taking? Sig Documenting Provider Last Dose Status Informant  albuterol (PROVENTIL HFA;VENTOLIN HFA) 108 (90 Base) MCG/ACT inhaler [id number] Yes Inhale 2 puffs into the lungs every 4 (four) hours as needed for wheezing or shortness of breath (cough, shortness of breath or wheezing.). [name], PA-C Taking Active   aspirin EC 81 MG tablet [id number] Yes Take 81 mg by mouth. [provider] Taking Active   atorvastatin (LIPITOR) 20 MG tablet [id number] Yes Take 1 tablet by mouth once daily [name], MD Taking Active   Continuous Blood Gluc Receiver (FREESTYLE LIBRE 14 DAY READER) DEVI [id number] Yes 1 Device by Does not apply route daily as needed. Use as directed. [name], MD Taking Active   Continuous Blood Gluc Sensor (FREESTYLE LIBRE 14 DAY SENSOR) [place] [id number] Yes USE AS DIRECTED [name], MD Taking Active   Dulaglutide (TRULICITY) 0.75 MG/0.5ML SOPN [id number] Yes Inject 0.75 mg into the skin once a week. [name], MD Taking Active   ferrous sulfate 325 (65 FE) MG EC tablet [id number] Yes Take 1 tablet (325 mg total) by mouth daily. [name], MD Taking Active   Insulin Glargine [facility]) 100 UNIT/ML SOPN [id number] Yes Inject 0.25 mLs (25 Units total) into the skin daily. [name], MD Taking Active   lisinopril (ZESTRIL) 20 MG tablet [id number] Yes Take 1 tablet (20 mg total) by mouth daily.  [name], MD Taking Active   metFORMIN (GLUCOPHAGE) 500 MG tablet [id number] Yes TAKE 1 TABLET BY MOUTH TWICE DAILY WITH A MEAL [name], [name], MD Taking Active   mometasone-formoterol [facility]) 200-5 MCG/ACT AERO [id number] Yes Inhale 2 puffs into the lungs 2 (two) times daily. [provider] Taking Active            Med Note Primus Bravo Jan 01, 2019  9:55 AM) Jeanette Ellis from [employer] Patient Assistance program substituted for [employer]  Multiple Vitamin (MULTIVITAMIN WITH MINERALS) TABS tablet [id number] Yes Take 1 tablet by mouth daily. [provider] Taking Active          Patient said she received the applications that were sent to her but she and her husband were just recovering from COVID. She also said she thought she was still eligible until April of 2021 instead of December of 2020.    She said she would send the applications back tomorrow.  Medication review: HgA1c-7.1% 10/20 On statin-atorvastatin 20mg  last filled-03/13/2019 for a 90 day supply   Plan: Route note to [place] to be looking for the patient's information. Follow up in 8-12 weeks.  10-12, PharmD, BCACP [facility] Clinical Pharmacist [phone]

## 2019-05-09 ENCOUNTER — Other Ambulatory Visit: Payer: Self-pay | Admitting: Family Medicine

## 2019-05-12 ENCOUNTER — Other Ambulatory Visit: Payer: Self-pay

## 2019-05-12 ENCOUNTER — Encounter: Payer: Self-pay | Admitting: Family Medicine

## 2019-05-12 ENCOUNTER — Ambulatory Visit: Payer: HMO

## 2019-05-12 ENCOUNTER — Ambulatory Visit (INDEPENDENT_AMBULATORY_CARE_PROVIDER_SITE_OTHER): Payer: Medicare HMO | Admitting: Family Medicine

## 2019-05-12 VITALS — BP 145/73 | HR 83 | Temp 98.2°F | Resp 17 | Ht 67.0 in | Wt 197.2 lb

## 2019-05-12 DIAGNOSIS — E11649 Type 2 diabetes mellitus with hypoglycemia without coma: Secondary | ICD-10-CM

## 2019-05-12 DIAGNOSIS — I10 Essential (primary) hypertension: Secondary | ICD-10-CM

## 2019-05-12 DIAGNOSIS — Z794 Long term (current) use of insulin: Secondary | ICD-10-CM | POA: Diagnosis not present

## 2019-05-12 DIAGNOSIS — D508 Other iron deficiency anemias: Secondary | ICD-10-CM

## 2019-05-12 LAB — CBC
Hematocrit: 33 % — ABNORMAL LOW (ref 34.0–46.6)
Hemoglobin: 10.9 g/dL — ABNORMAL LOW (ref 11.1–15.9)
MCH: 27.5 pg (ref 26.6–33.0)
MCHC: 33 g/dL (ref 31.5–35.7)
MCV: 83 fL (ref 79–97)
Platelets: 194 10*3/uL (ref 150–450)
RBC: 3.97 x10E6/uL (ref 3.77–5.28)
RDW: 11.8 % (ref 11.7–15.4)
WBC: 4.3 10*3/uL (ref 3.4–10.8)

## 2019-05-12 LAB — CMP14+EGFR
ALT: 25 IU/L (ref 0–32)
AST: 37 IU/L (ref 0–40)
Albumin/Globulin Ratio: 1.2 (ref 1.2–2.2)
Albumin: 4 g/dL (ref 3.8–4.8)
Alkaline Phosphatase: 65 IU/L (ref 39–117)
BUN/Creatinine Ratio: 19 (ref 12–28)
BUN: 16 mg/dL (ref 8–27)
Bilirubin Total: 0.7 mg/dL (ref 0.0–1.2)
CO2: 23 mmol/L (ref 20–29)
Calcium: 9.3 mg/dL (ref 8.7–10.3)
Chloride: 102 mmol/L (ref 96–106)
Creatinine, Ser: 0.86 mg/dL (ref 0.57–1.00)
GFR calc Af Amer: 79 mL/min/{1.73_m2} (ref >59)
GFR calc non Af Amer: 69 mL/min/{1.73_m2} (ref >59)
Globulin, Total: 3.3 g/dL (ref 1.5–4.5)
Glucose: 134 mg/dL — ABNORMAL HIGH (ref 65–99)
Potassium: 4.5 mmol/L (ref 3.5–5.2)
Sodium: 140 mmol/L (ref 134–144)
Total Protein: 7.3 g/dL (ref 6.0–8.5)

## 2019-05-12 LAB — POCT GLYCOSYLATED HEMOGLOBIN (HGB A1C): Hemoglobin A1C: 7.4 % — AB (ref 4.0–5.6)

## 2019-05-12 NOTE — Patient Instructions (Addendum)
   Your diabetes is still doing well.  I emailed [facility] so hopefully we can get you another freestyle.  Continue your current meds Add in some exercise even upper body chair exercises.      If you have lab work done today you will be contacted with your lab results within the next 2 weeks.  If you have not heard from [place] then please contact us. The fastest way to get your results is to register for My Chart.   IF you received an x-ray today, you will receive an invoice from [facility] Radiology. Please contact [facility] Radiology at [phone] with questions or concerns regarding your invoice.   IF you received labwork today, you will receive an invoice from [place]. Please contact [facility] at [phone] with questions or concerns regarding your invoice.   Our billing staff will not be able to assist you with questions regarding bills from these companies.  You will be contacted with the lab results as soon as they are available. The fastest way to get your results is to activate your My Chart account. Instructions are located on the last page of this paperwork. If you have not heard from [place] regarding the results in 2 weeks, please contact this office.

## 2019-05-12 NOTE — Progress Notes (Signed)
Established Patient Office Visit  Subjective:  Patient ID: Jeanette Ellis, female    DOB: 05-27-1950  Age: 69 y.o. MRN: [id number]  CC:  Chief Complaint  Patient presents with  . Diabetes  . Anemia    HPI Jeanette Ellis presents for   Diabetes Mellitus: Patient presents for follow up of diabetes. Symptoms: hypoglycemia . Symptoms have stabilized.  Last low reading was 75 without symptoms. Patient denies hyperglycemia, hypoglycemia , increase appetite, nausea and paresthesia of the feet.  Evaluation to date has been included: hemoglobin A1C.  Home sugars: not checked since her meter is broken. Lab Results  Component Value Date   HGBA1C 7.4 (A) 05/12/2019   Anemia Lab Results  Component Value Date   WBC 4.9 02/03/2019   HGB 10.4 (A) 02/03/2019   HCT 31.1 02/03/2019   MCV 83.0 02/03/2019   PLT 233 02/05/2018   Patient reports that she is taking an iron supplement She denies fatigue or palpitations She feels well  Hypertension: Patient here for follow-up of elevated blood pressure. She is not exercising and is adherent to low salt diet.  Blood pressure is well controlled at home. Cardiac symptoms none. Patient denies chest pain, claudication, dyspnea, fatigue and irregular heart beat.  Cardiovascular risk factors: advanced age (older than 48 for men, 24 for women), diabetes mellitus and hypertension. Use of agents associated with hypertension: none. History of target organ damage: none. BP Readings from Last 3 Encounters:  05/12/19 (!) 145/73  02/03/19 121/70  07/28/18 (!) 165/73     Past Medical History:  Diagnosis Date  . Anemia associated with stage 3 chronic renal failure 03/11/2017  . Diabetes mellitus without complication ([place])   . High cholesterol   . Hypertension   . Iron deficiency anemia 03/11/2017  . Sarcoidosis     Past Surgical History:  Procedure Laterality Date  . ABDOMINAL HYSTERECTOMY      Family History  Problem Relation Age of Onset  .  High blood pressure Mother   . Diabetes Father     Social History   Socioeconomic History  . Marital status: Married    Spouse name: Zynzulu  . Number of children: 4  . Years of education: Not on file  . Highest education level: Not on file  Occupational History  . Not on file  Tobacco Use  . Smoking status: Never Smoker  . Smokeless tobacco: Never Used  Substance and Sexual Activity  . Alcohol use: No  . Drug use: No  . Sexual activity: Not Currently  Other Topics Concern  . Not on file  Social History Narrative  . Not on file   Social Determinants of Health   Financial Resource Strain:   . Difficulty of Paying Living Expenses: Not on file  Food Insecurity: No Food Insecurity  . Worried About Charity fundraiser in the Last Year: Never true  . Ran Out of Food in the Last Year: Never true  Transportation Needs: No Transportation Needs  . Lack of Transportation (Medical): No  . Lack of Transportation (Non-Medical): No  Physical Activity:   . Days of Exercise per Week: Not on file  . Minutes of Exercise per Session: Not on file  Stress:   . Feeling of Stress : Not on file  Social Connections:   . Frequency of Communication with Friends and Family: Not on file  . Frequency of Social Gatherings with Friends and Family: Not on file  . Attends Religious Services: Not  on file  . Active Member of Clubs or Organizations: Not on file  . Attends Archivist Meetings: Not on file  . Marital Status: Not on file  Intimate Partner Violence:   . Fear of Current or Ex-Partner: Not on file  . Emotionally Abused: Not on file  . Physically Abused: Not on file  . Sexually Abused: Not on file    Outpatient Medications Prior to Visit  Medication Sig Dispense Refill  . albuterol (PROVENTIL HFA;VENTOLIN HFA) 108 (90 Base) MCG/ACT inhaler Inhale 2 puffs into the lungs every 4 (four) hours as needed for wheezing or shortness of breath (cough, shortness of breath or wheezing.).  1 Inhaler 1  . atorvastatin (LIPITOR) 20 MG tablet Take 1 tablet by mouth once daily 90 tablet 0  . Continuous Blood Gluc Receiver (FREESTYLE LIBRE 14 DAY READER) DEVI 1 Device by Does not apply route daily as needed. Use as directed. 1 each 0  . Continuous Blood Gluc Sensor (FREESTYLE LIBRE 14 DAY SENSOR) MISC USE AS DIRECTED 2 each 0  . Dulaglutide (TRULICITY) 6.62 HU/7.6LY SOPN Inject 0.75 mg into the skin once a week. 12 pen 1  . ferrous sulfate 325 (65 FE) MG EC tablet Take 1 tablet (325 mg total) by mouth daily. 90 tablet 1  . Insulin Glargine (BASAGLAR KWIKPEN) 100 UNIT/ML SOPN Inject 0.25 mLs (25 Units total) into the skin daily. 12 mL 1  . lisinopril (ZESTRIL) 20 MG tablet Take 1 tablet (20 mg total) by mouth daily. 90 tablet 1  . metFORMIN (GLUCOPHAGE) 500 MG tablet TAKE 1 TABLET BY MOUTH TWICE DAILY WITH A MEAL 180 tablet 0  . mometasone-formoterol (DULERA) 200-5 MCG/ACT AERO Inhale 2 puffs into the lungs 2 (two) times daily.    . Multiple Vitamin (MULTIVITAMIN WITH MINERALS) TABS tablet Take 1 tablet by mouth daily.    Marland Kitchen aspirin EC 81 MG tablet Take 81 mg by mouth.     No facility-administered medications prior to visit.    No Known Allergies  ROS Review of Systems    Review of Systems  Constitutional: Negative for activity change, appetite change, chills and fever.  HENT: Negative for congestion, nosebleeds, trouble swallowing and voice change.   Respiratory: Negative for cough, shortness of breath and wheezing.   Gastrointestinal: Negative for diarrhea, nausea and vomiting.  Genitourinary: Negative for difficulty urinating, dysuria, flank pain and hematuria.  Musculoskeletal: Negative for back pain, joint swelling and neck pain.  Neurological: Negative for dizziness, speech difficulty, light-headedness and numbness.  See HPI. All other review of systems negative.   Objective:    Physical Exam  BP (!) 145/73 (BP Location: Left Arm, Patient Position: Sitting, Cuff Size:  Large)   Pulse 83   Temp 98.2 F (36.8 C) (Oral)   Resp 17   Ht '5\' 7"'$  (1.702 m)   Wt 197 lb 3.2 oz (89.4 kg)   SpO2 97%   BMI 30.89 kg/m  Wt Readings from Last 3 Encounters:  05/12/19 197 lb 3.2 oz (89.4 kg)  02/03/19 197 lb 9.6 oz (89.6 kg)  07/28/18 195 lb (88.5 kg)    Physical Exam  Constitutional: Oriented to person, place, and time. Appears well-developed and well-nourished.  HENT:  Head: Normocephalic and atraumatic.  Eyes: Conjunctivae and EOM are normal.  Cardiovascular: Normal rate, regular rhythm, normal heart sounds and intact distal pulses.  No murmur heard. Pulmonary/Chest: Effort normal and breath sounds normal. No stridor. No respiratory distress. Has no wheezes.  Neurological: Is  alert and oriented to person, place, and time.  Skin: Skin is warm. Capillary refill takes less than 2 seconds.  Psychiatric: Has a normal mood and affect. Behavior is normal. Judgment and thought content normal.    There are no preventive care reminders to display for this patient.  There are no preventive care reminders to display for this patient.  Lab Results  Component Value Date   TSH 3.500 08/07/2017   Lab Results  Component Value Date   WBC 4.9 02/03/2019   HGB 10.4 (A) 02/03/2019   HCT 31.1 02/03/2019   MCV 83.0 02/03/2019   PLT 233 02/05/2018   Lab Results  Component Value Date   NA 142 02/03/2019   K 4.6 02/03/2019   CO2 24 02/03/2019   GLUCOSE 81 02/03/2019   BUN 11 02/03/2019   CREATININE 0.94 02/03/2019   BILITOT 0.7 02/03/2019   ALKPHOS 64 02/03/2019   AST 34 02/03/2019   ALT 25 02/03/2019   PROT 7.0 02/03/2019   ALBUMIN 4.1 02/03/2019   CALCIUM 9.5 02/03/2019   ANIONGAP 8 02/05/2018   Lab Results  Component Value Date   CHOL 138 02/03/2019   Lab Results  Component Value Date   HDL 68 02/03/2019   Lab Results  Component Value Date   LDLCALC 54 02/03/2019   Lab Results  Component Value Date   TRIG 82 02/03/2019   Lab Results    Component Value Date   CHOLHDL 2.0 02/03/2019   Lab Results  Component Value Date   HGBA1C 7.4 (A) 05/12/2019      Assessment & Plan:   Problem List Items Addressed This Visit      Cardiovascular and Mediastinum   Essential hypertension - Patient's blood pressure is at goal of 139/89 or less. Condition is stable. Continue current medications and treatment plan. I recommend that you exercise for 30-45 minutes 5 days a week. I also recommend a balanced diet with fruits and vegetables every day, lean meats, and little fried foods. The DASH diet (you can find this online) is a good example of this.      Other   Iron deficiency anemia (Chronic) - will monitor and notify pt of hemoglobin   Relevant Orders   CBC    Other Visit Diagnoses    Type 2 diabetes mellitus with hypoglycemia without coma, with long-term current use of insulin ([place])    -  Primary  Deterioration Advised pt to resume checking blood glucose Continue current meds    Relevant Orders   POCT glycosylated hemoglobin (Hb A1C) (Completed)   CMP14+EGFR      No orders of the defined types were placed in this encounter.   Follow-up: Return in about 3 months (around 08/09/2019) for diabetes mellitus.    Forrest Moron, MD

## 2019-05-14 ENCOUNTER — Other Ambulatory Visit: Payer: Self-pay | Admitting: Pharmacy Technician

## 2019-05-14 NOTE — Patient Outreach (Signed)
[facility]) Care Management  05/14/2019  VEGA STARE April 22, 1950 [id number]   Received both patient and provider portion(s) of patient assistance application(s) for Dulera, Proventil HFA, Trulicity and [employer]. Mailed completed application and required documents into [employer] for [employer] and Proventil. Faxed completed application and required documents into [employer] for Trulicity and [employer].  Will follow up with [employer] in 30 business days to check status of application. Will follow up with [employer] in 5-10 business days to check status of application.  Neida Ellegood P. Kailei Cowens, CPhT [facility] Care Management [phone]

## 2019-05-25 ENCOUNTER — Other Ambulatory Visit: Payer: Self-pay | Admitting: Pharmacy Technician

## 2019-05-25 ENCOUNTER — Other Ambulatory Visit: Payer: Self-pay | Admitting: Pharmacist

## 2019-05-25 NOTE — Patient Outreach (Signed)
[facility]) Care Management  05/25/2019  LURAE HORNBROOK [birth year] [id number]   Care coordination call placed to [employer] in regards to patient's application for Trulicity and Basaglar.  Spoke to [place] who informed the patient's enrollment ended on 04/09/2019. She informed she was unable to locate the application that has been faxed multiple times to [employer] but that does not mean they did not receive it. She informed the fax number we gave her from the server we are faxing it from is not coming across as the same number when they receive it. She informed they process orders in the order they receive them and she suggested either re faxing the document or checking back sometime next week.  Will follow up with [employer] in 5-7 business days.  Nysir Fergusson P. Omunique Pederson, CPhT Musician Care Management [phone]

## 2019-05-25 NOTE — Patient Outreach (Signed)
[facility]) Care Management  05/25/2019  MAZELLE HUEBERT Jul 26, 1950 [id number]  Patient called in to inquire about her application to [facility].  HIPAA identifiers were obtained.  Noreene Larsson Simcox, CPhT spoke with [employer] today and there has been an issue with faxing. She resent the application today to two separate numbers including the number the patient gave me today:  [phone]. Patient was updated on the status of her application. She also inquired about Dulera and Proventil. Those medications are available through [facility] and have been mailed to them.   Patient was counseled about the "attestation form" from [employer].   She said she was going to run out of Hospital doctor and Trulicity. She was encouraged to go to her local pharmacy for a one time fill until her application can be processed.  Plan: [name] will follow up for patient assistance.   Beecher Mcardle, PharmD, BCACP [facility] Clinical Pharmacist [phone]

## 2019-06-01 ENCOUNTER — Other Ambulatory Visit: Payer: Self-pay | Admitting: Pharmacy Technician

## 2019-06-01 ENCOUNTER — Other Ambulatory Visit: Payer: Self-pay | Admitting: Family Medicine

## 2019-06-01 DIAGNOSIS — Z794 Long term (current) use of insulin: Secondary | ICD-10-CM

## 2019-06-01 DIAGNOSIS — E119 Type 2 diabetes mellitus without complications: Secondary | ICD-10-CM

## 2019-06-01 MED ORDER — TRULICITY 0.75 MG/0.5ML ~~LOC~~ SOAJ: 0.7500 mg | SUBCUTANEOUS | 1 refills | Status: DC

## 2019-06-01 MED ORDER — BASAGLAR KWIKPEN 100 UNIT/ML ~~LOC~~ SOPN: 25.0000 [IU] | PEN_INJECTOR | Freq: Every day | SUBCUTANEOUS | 1 refills | Status: DC

## 2019-06-01 NOTE — Patient Outreach (Signed)
[facility]) Care Management  06/01/2019  Jeanette Ellis [birth year] [id number]    ADDENDUM  Unsuccessful outreach call placed to patient in regards to Trulicity and Radio producer with [employer] and Elwin Sleight and Proventil HFA application with [employer].  Unfortunately, tried multiple times and the phone number was busy. Was unable to leave a HIPAA compliant message.  Was calling to provide patient the phone number to [employer] to order the Trulicity, update her on the [place] application as well as the [employer] application.  Will attempt 2nd outreach in 5-7 business days.  Jeanette Ellis P. Tonique Mendonca, CPhT Musician Care Management [phone]

## 2019-06-01 NOTE — Patient Outreach (Signed)
[facility]) Care Management  06/01/2019  SUMAIYAH MARKERT 1950-11-23 [id number]  Care coordination call placed to [employer] in regards to patient's application for Trulicity and Basaglar.  Spoke to [place] who informed patient in enrolled in the system and APPROVED for Trulicity only effective 05/26/2019-12/31/221. She informed the pharmacy would be outreaching patient to set up delivery of the medicaiton.  She informed the patient is not enrolled in [place] as the prescription is written incorrectly. She informed the provider wrote the number of units in the strength field which would need to be corrected before the application could continue to be processed. She also informed to have the directions indicate why the max dose is 30 but the directions are 25 units qd. An example would be for the provided to write in the directions field may titrate up to a max daily dose of 30 units.  Will correct and re fax the prescription portion of the application when back in the office later this week and will follow up with [employer] in another 5-7 business days.  Carry Weesner P. Yeriel Mineo, CPhT Musician Care Management [phone]

## 2019-06-03 ENCOUNTER — Telehealth: Payer: Self-pay

## 2019-06-03 NOTE — Telephone Encounter (Signed)
RX and forms for [employer] have been faxed back with conformation

## 2019-06-09 ENCOUNTER — Other Ambulatory Visit: Payer: Self-pay | Admitting: Pharmacy Technician

## 2019-06-09 NOTE — Patient Outreach (Signed)
[facility]) Care Management  06/09/2019  Jeanette Ellis 11/06/1950 [id number]    ADDENDUM  Successful outreach call placed to patient in regards to [employer] application for [employer] and Trulicity and Energy manager for [employer] and Proventil.  Spoke to patient, HIPAA identifiers verified.  Patient informed she received the Trulicity. Patient informed she was not in need of Basaglar as she has a current supply. Provided patient the phon number to [employer] so that when she is down to a 2 week supply of either medication then she can call them to request a refill. Patient verbalized understanding. Confirmed patient had name and number if she had issues placing her refills.  Informed patient that we were still working on [employer] application but they were running 1&1/2 to 2 months behind in processing. Informed her that I would outreach her when I had an update.  Will follow up with [employer] in 10-15 business days.  Emely Fahy P. Jasmen Emrich, CPhT [facility]  [phone]

## 2019-06-09 NOTE — Patient Outreach (Signed)
[facility]) Care Management  06/09/2019  Jeanette Ellis 08-Sep-1950 [id number]  Care coordination call placed to [employer] in regards to patient's application for Duelra and Proventil HFA.  Spoke to [place] who informed they have not received or processed the renewal application that was mailed to them on 05/14/2019. Aram Beecham informed they were running several weeks behind schedule. She informed to check back in a few weeks.  Will follow up with [employer] in 10-15 business days to inquire on status of application.  Johnedward Brodrick P. Richerd Grime, CPhT [facility]  [phone]

## 2019-06-09 NOTE — Patient Outreach (Signed)
[facility]) Care Management  06/09/2019  Jeanette Ellis Jul 15, 1950 [id number]   ADDENDUM  Care coordination call placed to [employer] in regards to patient's application for Trulicity and Basaglar.  Spoke to [place] who informed patient was APPROVED 05/26/2019-04/08/2020 for both medications. She informed the Trulicity for a 4 month supply was delivered on 06/03/2019. She informed a request was sent to the pharmacy for the Basaglar but it has not shipped yet. She informed this normally means they are out of stock on the medication and are awaiting a shipment to arrive. She informed if the patient is need of a refill on the Basaglar then she can call into [employer] and they will transfer her to [name].  Will outreach patient with this information.  Jeanette Mulcahey P. Analyah Mcconnon, CPhT [facility]  [phone]

## 2019-06-11 ENCOUNTER — Other Ambulatory Visit: Payer: Self-pay | Admitting: Family Medicine

## 2019-06-11 DIAGNOSIS — E119 Type 2 diabetes mellitus without complications: Secondary | ICD-10-CM

## 2019-06-11 NOTE — Telephone Encounter (Signed)
Requested Prescriptions  Pending Prescriptions Disp Refills  . 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, with long-term current use of insulin ([place])   Primary Care at [facility], [name], MD   4 months ago Type 2 diabetes mellitus with hypoglycemia without coma, with long-term current use of insulin ([place])   Primary Care at [facility], Arlie Solomons, MD   10 months ago Essential hypertension   Primary Care at [facility], Arlie Solomons, MD   11 months ago Medicare annual wellness visit, subsequent   Primary Care at [facility], [name], MD   1 year ago Type 2 diabetes mellitus without complication, with long-term current use of insulin [facility])   Primary Care at [place], MD      Future Appointments            In 2 months Forrest Moron, MD Primary Care at [place], [facility]

## 2019-06-16 ENCOUNTER — Other Ambulatory Visit: Payer: Self-pay | Admitting: Family Medicine

## 2019-06-16 DIAGNOSIS — E785 Hyperlipidemia, unspecified: Secondary | ICD-10-CM

## 2019-06-16 NOTE — Telephone Encounter (Signed)
Requested Prescriptions  Pending Prescriptions Disp Refills  . atorvastatin (LIPITOR) 20 MG tablet [Pharmacy Med Name: Atorvastatin Calcium 20 MG Oral Tablet] 90 tablet 0    Sig: Take 1 tablet by mouth once daily     Cardiovascular:  Antilipid - Statins Failed - 06/16/2019 10:06 AM      Failed - LDL in normal range and within 360 days    LDL Chol Calc (NIH)  Date Value Ref Range Status  02/03/2019 54 0 - 99 mg/dL Final         Passed - Total Cholesterol in normal range and within 360 days    Cholesterol, Total  Date Value Ref Range Status  02/03/2019 138 100 - 199 mg/dL Final         Passed - HDL in normal range and within 360 days    HDL  Date Value Ref Range Status  02/03/2019 68 >39 mg/dL Final         Passed - Triglycerides in normal range and within 360 days    Triglycerides  Date Value Ref Range Status  02/03/2019 82 0 - 149 mg/dL Final         Passed - Patient is not pregnant      Passed - Valid encounter within last 12 months    Recent Outpatient Visits          1 month ago Type 2 diabetes mellitus with hypoglycemia without coma, with long-term current use of insulin (H[facility])   Primary Care at [facility], [name], MD   4 months ago Type 2 diabetes mellitus with hypoglycemia without coma, with long-term current use of insulin (HCC)   Primary Care at [facility], Manus Rudd, MD   10 months ago Essential hypertension   Primary Care at [facility], Manus Rudd, MD   11 months ago Medicare annual wellness visit, subsequent   Primary Care at [facility], [name], MD   1 year ago Type 2 diabetes mellitus without complication, with long-term current use of insulin [facility])   Primary Care at [facility], Manus Rudd, MD      Future Appointments            In 1 month [name], MD Primary Care at [place], [facility]

## 2019-06-19 ENCOUNTER — Ambulatory Visit (INDEPENDENT_AMBULATORY_CARE_PROVIDER_SITE_OTHER): Payer: Medicare HMO | Admitting: Adult Health Nurse Practitioner

## 2019-06-19 ENCOUNTER — Other Ambulatory Visit: Payer: Self-pay

## 2019-06-19 VITALS — BP 170/81 | HR 89 | Temp 98.1°F | Ht 70.0 in | Wt 201.2 lb

## 2019-06-19 DIAGNOSIS — Z23 Encounter for immunization: Secondary | ICD-10-CM | POA: Diagnosis not present

## 2019-06-19 DIAGNOSIS — N12 Tubulo-interstitial nephritis, not specified as acute or chronic: Secondary | ICD-10-CM | POA: Diagnosis not present

## 2019-06-19 DIAGNOSIS — N3001 Acute cystitis with hematuria: Secondary | ICD-10-CM

## 2019-06-19 DIAGNOSIS — R102 Pelvic and perineal pain: Secondary | ICD-10-CM | POA: Insufficient documentation

## 2019-06-19 DIAGNOSIS — R300 Dysuria: Secondary | ICD-10-CM | POA: Insufficient documentation

## 2019-06-19 LAB — POCT URINALYSIS DIP (MANUAL ENTRY)
Bilirubin, UA: NEGATIVE
Glucose, UA: NEGATIVE mg/dL
Ketones, POC UA: NEGATIVE mg/dL
Nitrite, UA: POSITIVE — AB
Protein Ur, POC: 100 mg/dL — AB
Spec Grav, UA: 1.02 (ref 1.010–1.025)
Urobilinogen, UA: 1 E.U./dL
pH, UA: 6 (ref 5.0–8.0)

## 2019-06-19 MED ORDER — NITROFURANTOIN MONOHYD MACRO 100 MG PO CAPS: 100.0000 mg | ORAL_CAPSULE | Freq: Two times a day (BID) | ORAL | 0 refills | Status: AC

## 2019-06-19 NOTE — Patient Instructions (Signed)
° ° ° °  If you have lab work done today you will be contacted with your lab results within the next 2 weeks.  If you have not heard from us then please contact us. The fastest way to get your results is to register for My Chart. ° ° °IF you received an x-ray today, you will receive an invoice from [facility]. Please contact [facility] at [phone] with questions or concerns regarding your invoice.  ° °IF you received labwork today, you will receive an invoice from [facility]. Please contact [facility] at [phone] with questions or concerns regarding your invoice.  ° °Our billing staff will not be able to assist you with questions regarding bills from these companies. ° °You will be contacted with the lab results as soon as they are available. The fastest way to get your results is to activate your My Chart account. Instructions are located on the last page of this paperwork. If you have not heard from us regarding the results in 2 weeks, please contact this office. °  ° ° ° °

## 2019-06-24 ENCOUNTER — Other Ambulatory Visit: Payer: Self-pay | Admitting: Pharmacy Technician

## 2019-06-24 LAB — URINE CULTURE

## 2019-06-24 NOTE — Patient Outreach (Signed)
[facility]) Care Management  06/24/2019  TEMPERENCE ZENOR [birth year] [id number]   Care coordination call placed to [employer] in regards to patient's application for [facility] and Proventil.  Spoke to [place] who informed the application was received on 06/18/2019 and the attestation form was mailed to the patient on 06/19/2019. Once [employer]ck receives back the attestation form then the process can continue. She informed that patient will have to be approved on or before 07/08/2019 for the [facility] in order to be enrolled into the program as [employer] is spinning off the [facility] patient assistance program. Will follow up with patient.  Successful outreach call placed to patient. Spoke to patient and HIPAA identifiers verified.  Patient informed she has not received a letter from [employer]. Informed patient to call me when she has received the letter and then we can discuss the attestation form together. Provided patient my phone number. Patient verbalized understanding.  Will follow up with patient in 5-7 business days to confirm receipt of attestation form as the [facility] program is spinning off and patient needs to be approved by 07/08/2019 to receive assistance.  Marquisa Salih P. Markis Langland, CPhT [facility]  [phone]

## 2019-06-28 ENCOUNTER — Encounter: Payer: Self-pay | Admitting: Family Medicine

## 2019-06-30 ENCOUNTER — Encounter: Payer: Self-pay | Admitting: Family Medicine

## 2019-07-01 ENCOUNTER — Other Ambulatory Visit: Payer: Self-pay | Admitting: Pharmacy Technician

## 2019-07-01 NOTE — Telephone Encounter (Signed)
06/19/2019 office visit pt now complaining symptoms are back after completing the abx  Should she return to the office for revaluation?

## 2019-07-01 NOTE — Patient Outreach (Signed)
[facility]) Care Management  07/01/2019  Jeanette Ellis 1950-07-30 [id number]   Successful outreach call placed to patient in regards to patient's application for Dulera and Proventil with [employer].  Spoke to patient, HIPAA identifiers verified.  Patient informed she received the attestation form. She informed she answered the 3 questions and signed the form. She informed she placed the attestation form and original application in the envelope provided. She informed she was taking it to the post office today.  Informed patient to make sure she mails it as soon as possible because it will need to be received by 07/08/2019 for patient to approved in the program for [facility] as [employer]ck is discontinuing Dulera in the program after 07/08/2019. Patient informed she would make sure it gets mailed today.  Will follow up with [employer] in 3-5 business days to confirm it was received.  Robertta Halfhill P. Kiyana Vazguez, CPhT [facility]  [phone]

## 2019-07-06 ENCOUNTER — Other Ambulatory Visit: Payer: Self-pay | Admitting: Pharmacy Technician

## 2019-07-06 NOTE — Patient Outreach (Signed)
[facility]) Care Management  07/06/2019  Jeanette Ellis June 04, 1950 [id number]  Care coordination call placed to [employer] in regards to patient's application for [facility] and Proventil HFA.  Spoke to [place] who informed they have not received back or processed the attestation form that the patient says she mailed back to them. [place] informed patient will need to be approved for [facility] by 07/08/2019 or she will be denied as Merck will no longer support Dulera starting 07/09/2019.He informed they will continue to honor the Proventil HFA if received after 07/08/2019.  Will follow up with [employer] in 2-3 business days.  Jeanette Ellis P. Youlanda Tomassetti, CPhT [facility]  [phone]

## 2019-07-07 ENCOUNTER — Other Ambulatory Visit: Payer: Self-pay | Admitting: Family Medicine

## 2019-07-07 ENCOUNTER — Other Ambulatory Visit: Payer: Self-pay | Admitting: Pharmacist

## 2019-07-07 ENCOUNTER — Other Ambulatory Visit: Payer: Self-pay | Admitting: Pharmacy Technician

## 2019-07-07 DIAGNOSIS — I10 Essential (primary) hypertension: Secondary | ICD-10-CM

## 2019-07-07 NOTE — Patient Outreach (Signed)
[facility]) Care Management  07/07/2019  SAIDE LANUZA [birth year] [id number]  Called patient to follow up on medication assistance. HIPAA identifiers were obtained.  According to [name]' note from yesterday, [employer] had not received the patient's completed application and attestation form. Patient reported that she sent the forms back last week or the week before.  Unfortunately, [employer]ck has announced they will no longer provide Dulera through their program after 07/09/2019. Applications received after 07/08/2019 will not be approved.   As such, a message will be sent to Dr. Creta Levin to inquire about a therapeutic switch to Symbicort.  Plan:  Since I do not forsee an issue transferring the patient to Symbicort, I will route patient assistance letter to [facility] pharmacy technician who will coordinate patient assistance program application process for medications listed above.  [facility] pharmacy technician will assist with obtaining all required documents from both patient and provider(s) and submit application(s) once completed.   Beecher Mcardle, PharmD, BCACP [facility] Clinical Pharmacist [phone]

## 2019-07-07 NOTE — Telephone Encounter (Signed)
Requested Prescriptions  Pending Prescriptions Disp Refills  . lisinopril (ZESTRIL) 20 MG tablet [Pharmacy Med Name: Lisinopril 20 MG Oral Tablet] 90 tablet 0    Sig: Take 1 tablet by mouth once daily     Cardiovascular:  ACE Inhibitors Failed - 07/07/2019 11:27 AM      Failed - Last BP in normal range    BP Readings from Last 1 Encounters:  06/19/19 (!) 170/81         Passed - Cr in normal range and within 180 days    Creatinine  Date Value Ref Range Status  02/05/2018 0.87 0.44 - 1.00 mg/dL Final   Creat  Date Value Ref Range Status  03/11/2017 0.7 0.6 - 1.2 mg/dl Final   Creatinine, Ser  Date Value Ref Range Status  05/12/2019 0.86 0.57 - 1.00 mg/dL Final         Passed - K in normal range and within 180 days    Potassium  Date Value Ref Range Status  05/12/2019 4.5 3.5 - 5.2 mmol/L Final  03/11/2017 4.2 3.3 - 4.7 mEq/L Final         Passed - Patient is not pregnant      Passed - Valid encounter within last 6 months    Recent Outpatient Visits          2 weeks ago Dysuria   Primary Care at [facility], Lonna Cobb, NP   1 month ago Type 2 diabetes mellitus with hypoglycemia without coma, with long-term current use of insulin (HCC)   Primary Care at [facility], [name], MD   5 months ago Type 2 diabetes mellitus with hypoglycemia without coma, with long-term current use of insulin (HCC)   Primary Care at [facility], Manus Rudd, MD   11 months ago Essential hypertension   Primary Care at [facility], Manus Rudd, MD   1 year ago Medicare annual wellness visit, subsequent   Primary Care at [facility], Manus Rudd, MD      Future Appointments            In 1 month [name], MD Primary Care at [place], [facility]

## 2019-07-07 NOTE — Patient Outreach (Signed)
[facility]) Care Management  07/07/2019  Jeanette Ellis 12-17-1950 [id number]                                        Medication Assistance Referral  Referral From: [facility] RPh Katina B.  Medication/Company: Symbicort / [place]&ME Patient application portion:  Mailed Provider application portion: Faxed  to Dr. Collie Siad Provider address/fax verified via: Office website    Follow up:  Will follow up with patient in 10-14 business days to confirm application(s) have been received.  Abbigayle Toole P. June Vacha, CPhT [facility]  [phone]

## 2019-07-08 ENCOUNTER — Ambulatory Visit: Payer: Self-pay | Admitting: Pharmacist

## 2019-07-09 ENCOUNTER — Other Ambulatory Visit: Payer: Self-pay | Admitting: Pharmacy Technician

## 2019-07-09 NOTE — Patient Outreach (Signed)
[facility]) Care Management  07/09/2019  Jeanette Ellis [birth year] [id number]    Care coordination call placed to [employer] in regards to patient's application for [facility] and Proventil HFA.  Spoke to [place] who informed that patient was APPROVED 07/08/2019-12/31/201. She informed it would take 2-3 business days for them to receive and fill the prescription and another 7-10 business days for patient to receive the medication.  Will follow up with patient in 10-15 business days to confirm medication was delivered and to discuss refill procedure.  Javian Nudd P. Brysten Reister, CPhT [facility]  [phone]

## 2019-07-17 ENCOUNTER — Encounter: Payer: Self-pay | Admitting: Adult Health Nurse Practitioner

## 2019-07-17 NOTE — Progress Notes (Signed)
SUBJECTIVE: Jeanette Ellis is a 69 y.o. female who complains of urinary frequency, urgency and dysuria x 2 days, without flank pain, fever, chills, or abnormal vaginal discharge or bleeding.   OBJECTIVE: Appears well, in no apparent distress.  Vital signs are normal. The abdomen is soft without tenderness, guarding, mass, rebound or organomegaly. No CVA tenderness or inguinal adenopathy noted. Urine dipstick shows positive for RBC's and positive for nitrates.  Micro exam: not done.   ASSESSMENT: UTI uncomplicated without evidence of pyelonephritis  PLAN: Treatment per orders - also push fluids, may use Pyridium OTC prn. Call or return to clinic prn if these symptoms worsen or fail to improve as anticipated.  1. Dysuria   2. Pelvic pain   3. Acute cystitis with hematuria   4. Need for tetanus booster   5. Pyelonephritis    Orders Placed This Encounter  Procedures  . Urine Culture  . POCT urinalysis dipstick   Meds ordered this encounter  Medications  . nitrofurantoin, macrocrystal-monohydrate, (MACROBID) 100 MG capsule    Sig: Take 1 capsule (100 mg total) by mouth 2 (two) times daily for 5 days.    Dispense:  10 capsule    Refill:  0   Elyse Jarvis, NP

## 2019-07-23 ENCOUNTER — Other Ambulatory Visit: Payer: Self-pay | Admitting: Pharmacy Technician

## 2019-07-23 NOTE — Patient Outreach (Signed)
[facility]) Care Management  07/23/2019  Jeanette Ellis [birth year] [id number]   Unsuccessful call placed to patient regarding patient assistance medication delivery of Dulera and Proventil HFA and patient assistance application for AZ&ME for Symbicort, HIPAA compliant voicemail left.   Was calling to inquire if patient had received the medication and to discuss the refill procedure as [employer] has a Geologist, engineering.  Was also calling to inquire if she has received the AZ&ME application for Symbicort which will replace Elwin Sleight once [employer] their supply of [employer].  Follow up:  Will followup with patient in 2-5 business days if call is not returned.  Darion Juhasz P. Jalaiya Oyster, CPhT [facility]  [phone]

## 2019-07-28 ENCOUNTER — Other Ambulatory Visit: Payer: Self-pay | Admitting: Pharmacy Technician

## 2019-07-28 ENCOUNTER — Other Ambulatory Visit: Payer: Self-pay | Admitting: Pharmacist

## 2019-07-28 NOTE — Patient Outreach (Signed)
[facility] ([facility])  [facility] Quality Pharmacy Team    [facility] pharmacy case will be closed as our team is transitioning from the [facility] Care Management Department into the [facility] Quality Department and will no longer be using [facility] for documentation purposes.   [facility] pharmacy technician will continue to assist patient with medication assistance program applications until complete.       Khaleef Ruby J. Tascha Casares, PharmD, BCACP [facility] Clinical Pharmacist [phone]   

## 2019-07-28 NOTE — Patient Outreach (Signed)
[facility]) Care Management  07/28/2019  [name] 1951-01-30 [id number]  Successful call placed to patient regarding patient assistance medication delivery of Dulera and Proventil HFA with [employer] and application for Symbicort which will be replacing the Duelra with AZ&ME, HIPAA identifiers verified.   Patient informed she received the inhalers. Discussed briefly the refill process for the Proventil HFA which requires patient to call in the request to [facility] pharmacy which this year is [facility] (last year it was [facility]). Patient verbalized understanding.  Patient also informed she has received the AZ&ME application for Symbicort. She informed she would work on completing that application and mailing it back in.  Follow up:  Will route note to [facility] RPh Nunzio Cobbs for case closure if document(s) have not been received in the next 15 business days.  Jadyn Barge P. Etter Royall, CPhT [facility]  [phone]

## 2019-07-29 ENCOUNTER — Other Ambulatory Visit: Payer: Self-pay | Admitting: Pharmacy Technician

## 2019-07-29 ENCOUNTER — Other Ambulatory Visit: Payer: Self-pay | Admitting: Family Medicine

## 2019-07-29 DIAGNOSIS — E119 Type 2 diabetes mellitus without complications: Secondary | ICD-10-CM

## 2019-07-29 NOTE — Patient Outreach (Signed)
[facility]) Care Management  07/29/2019  Jeanette Ellis 07-18-1950 [id number]  Incoming call received from patient in regards to [employer] application for [facility] and Proventil HFA.  Spoke to patient, HIPAA identifiers verified.  Patient informed she misspoke yesterday and has only received the Proventil HFA. Informed patient I would outreach [employer] to inquire about [facility] delivery.  Patient was agreeable to this plan.  Meta Kroenke P. Avenell Sellers, CPhT [facility]  [phone]

## 2019-07-29 NOTE — Telephone Encounter (Signed)
Requested Prescriptions  Pending Prescriptions Disp Refills  . Continuous Blood Gluc Sensor (FREESTYLE LIBRE 14 DAY SENSOR) MISC [Pharmacy Med Name: FREESTYLE LIBRE SENSOR 14D KIT] 2 each 2    Sig: USE AS DIRECTED     Endocrinology: Diabetes - Testing Supplies Passed - 07/29/2019  5:30 AM      Passed - Valid encounter within last 12 months    Recent Outpatient Visits          1 month ago Dysuria   Primary Care at [facility], Lorelee Market, NP   2 months ago Type 2 diabetes mellitus with hypoglycemia without coma, with long-term current use of insulin ([place])   Primary Care at [facility], [name], MD   5 months ago Type 2 diabetes mellitus with hypoglycemia without coma, with long-term current use of insulin [facility])   Primary Care at [facility], Arlie Solomons, MD   1 year ago Essential hypertension   Primary Care at [facility], Arlie Solomons, MD   1 year ago Medicare annual wellness visit, subsequent   Primary Care at [place], MD      Future Appointments            In 1 week Forrest Moron, MD Primary Care at [place], [facility]

## 2019-07-29 NOTE — Patient Outreach (Signed)
[facility]) Care Management  07/29/2019  Jeanette Ellis 1950-09-29 [id number]   ADDENDUM   Successful call placed to patient regarding patient assistance medication delivery of Dulera with [employer], HIPAA identifiers verified.   Informed patient that the [employer] representative form [facility] informed me that the Elwin Sleight is out for delivery today. Patient verbalized understanding.    Jury Caserta P. Hallelujah Wysong, CPhT [facility] [employer]  [phone]

## 2019-07-29 NOTE — Patient Outreach (Signed)
[facility]) Care Management  07/29/2019  Jeanette Ellis 1950-09-05 [id number]  ADDENDUM  Care coordination call placed to [facility] pharmacy [facility] in regards to patient's [facility] shipment.  Spoke to [employer] who informed the Elwin Sleight was out for delivery to the patient today.  Will call patient back with this information.  Sharron Petruska P. Dakarri Kessinger, CPhT [facility] [employer]  [phone]

## 2019-07-30 ENCOUNTER — Other Ambulatory Visit: Payer: Self-pay | Admitting: Pharmacist

## 2019-07-30 NOTE — Patient Outreach (Signed)
[facility] ([facility])  [facility] Quality Pharmacy Team    [facility] pharmacy case will be closed as our team is transitioning from the [facility] Care Management Department into the [facility] Quality Department and will no longer be using [facility] for documentation purposes.   [facility] pharmacy technician will continue to assist patient with medication assistance program applications until complete.       Linnie Mcglocklin J. Domino Holten, PharmD, BCACP [facility] Clinical Pharmacist [phone]   

## 2019-08-03 ENCOUNTER — Telehealth: Payer: Self-pay | Admitting: *Deleted

## 2019-08-03 NOTE — Telephone Encounter (Signed)
KEY:   [id number]    QVAR

## 2019-08-04 ENCOUNTER — Ambulatory Visit: Payer: Medicare HMO | Admitting: Family Medicine

## 2019-08-04 ENCOUNTER — Other Ambulatory Visit: Payer: Self-pay | Admitting: Pharmacy Technician

## 2019-08-04 ENCOUNTER — Telehealth: Payer: Self-pay

## 2019-08-04 NOTE — Patient Outreach (Signed)
[facility]) Care Management  08/04/2019  Jeanette Ellis 07/29/1950 [id number]   Care coordination call placed to Dr. Creta Levin office in regards to AZ&ME application for Symbicort.  Spoke to [name] to clarify strength of Symbicort written on the application. The provider wrote Symbicort 160/18mcg. After consulting [facility] RPh Tiffany Benfield, it was confirmed that Symbicort comes as 80/4.93mcg  and 160/4.50mcg.  [name] was able to provide clarification while  on the line and gave permission to fix the strength on the application.  [name] P. Henning Ehle, CPhT [facility] [employer]  [phone]

## 2019-08-04 NOTE — Patient Outreach (Signed)
[facility]) Care Management  08/04/2019  Jeanette Ellis 01/09/1951 [id number]   ADDENDUM  Received both patient and provider portion(s) of patient assistance application(s) for Symbicort. Faxed completed application and required documents into AZ&ME.  Will follow up with company(ies) in 5-15 business days to check status of application(s).  Emira Eubanks P. Mckenleigh Tarlton, CPhT [facility] [employer]  [phone]

## 2019-08-04 NOTE — Telephone Encounter (Signed)
Noreene Larsson from [facility] assistance called in regards to pt Symbicort. Seems as thought the rx sig request form was incorrect. It is to read Symbicort 160-4.5. Correction approval was made, no follow up necessary for right now

## 2019-08-07 DIAGNOSIS — H35033 Hypertensive retinopathy, bilateral: Secondary | ICD-10-CM | POA: Diagnosis not present

## 2019-08-07 DIAGNOSIS — E113313 Type 2 diabetes mellitus with moderate nonproliferative diabetic retinopathy with macular edema, bilateral: Secondary | ICD-10-CM | POA: Diagnosis not present

## 2019-08-07 DIAGNOSIS — H2513 Age-related nuclear cataract, bilateral: Secondary | ICD-10-CM | POA: Diagnosis not present

## 2019-08-07 DIAGNOSIS — H35373 Puckering of macula, bilateral: Secondary | ICD-10-CM | POA: Diagnosis not present

## 2019-08-10 ENCOUNTER — Ambulatory Visit: Payer: Medicare HMO | Admitting: Family Medicine

## 2019-08-11 ENCOUNTER — Ambulatory Visit (INDEPENDENT_AMBULATORY_CARE_PROVIDER_SITE_OTHER): Payer: Medicare HMO | Admitting: Family Medicine

## 2019-08-11 ENCOUNTER — Encounter: Payer: Self-pay | Admitting: Family Medicine

## 2019-08-11 ENCOUNTER — Other Ambulatory Visit: Payer: Self-pay

## 2019-08-11 ENCOUNTER — Ambulatory Visit: Payer: Self-pay | Admitting: Pharmacist

## 2019-08-11 VITALS — BP 134/77 | HR 73 | Temp 98.0°F | Resp 17 | Ht 70.0 in | Wt 198.4 lb

## 2019-08-11 DIAGNOSIS — Z794 Long term (current) use of insulin: Secondary | ICD-10-CM | POA: Diagnosis not present

## 2019-08-11 DIAGNOSIS — E782 Mixed hyperlipidemia: Secondary | ICD-10-CM

## 2019-08-11 DIAGNOSIS — E11649 Type 2 diabetes mellitus with hypoglycemia without coma: Secondary | ICD-10-CM | POA: Diagnosis not present

## 2019-08-11 DIAGNOSIS — E1165 Type 2 diabetes mellitus with hyperglycemia: Secondary | ICD-10-CM | POA: Diagnosis not present

## 2019-08-11 DIAGNOSIS — I10 Essential (primary) hypertension: Secondary | ICD-10-CM | POA: Diagnosis not present

## 2019-08-11 LAB — CMP14+EGFR
ALT: 16 IU/L (ref 0–32)
AST: 24 IU/L (ref 0–40)
Albumin/Globulin Ratio: 1.6 (ref 1.2–2.2)
Albumin: 4.4 g/dL (ref 3.7–4.7)
Alkaline Phosphatase: 73 IU/L (ref 39–117)
BUN/Creatinine Ratio: 15 (ref 12–28)
BUN: 15 mg/dL (ref 8–27)
Bilirubin Total: 0.5 mg/dL (ref 0.0–1.2)
CO2: 23 mmol/L (ref 20–29)
Calcium: 9.7 mg/dL (ref 8.7–10.3)
Chloride: 104 mmol/L (ref 96–106)
Creatinine, Ser: 0.97 mg/dL (ref 0.57–1.00)
GFR calc Af Amer: 68 mL/min/{1.73_m2} (ref >59)
GFR calc non Af Amer: 59 mL/min/{1.73_m2} — ABNORMAL LOW (ref >59)
Globulin, Total: 2.8 g/dL (ref 1.5–4.5)
Glucose: 90 mg/dL (ref 65–99)
Potassium: 4.6 mmol/L (ref 3.5–5.2)
Sodium: 142 mmol/L (ref 134–144)
Total Protein: 7.2 g/dL (ref 6.0–8.5)

## 2019-08-11 LAB — POCT GLYCOSYLATED HEMOGLOBIN (HGB A1C): Hemoglobin A1C: 7.7 % — AB (ref 4.0–5.6)

## 2019-08-11 MED ORDER — DEXCOM G5 MOB/G4 PLAT SENSOR MISC: 1 refills | Status: DC

## 2019-08-11 MED ORDER — DEXCOM G5 MOBILE TRANSMITTER MISC: 1 refills | Status: DC

## 2019-08-11 MED ORDER — DEXCOM G5 MOBILE RECEIVER DEVI: 1 refills | Status: DC

## 2019-08-11 NOTE — Progress Notes (Signed)
Established Patient Office Visit  Subjective:  Patient ID: Jeanette Ellis, female    DOB: 12-18-1950  Age: 69 y.o. MRN: [id number]  CC:  Chief Complaint  Patient presents with  . Diabetes    3 month f/u, pt has discontinued aspirin  . Medication Refill    albuterol inhaler, atorvastatin, freestyle libre 14 day sensor, trulicity, ferrous sulfate, basaglar kwikpen, lisinopril, metformin, dulera     HPI Jeanette Ellis presents for   Diabetes Mellitus: Patient presents for follow up of diabetes. Symptoms: hyperglycemia. Symptoms have stabilized. Patient denies hypoglycemia .  Evaluation to date has been included: fasting lipid panel and hemoglobin A1C.  Home sugars: BGs consistently in an acceptable range. Treatment to date: Glargine 25 units, metformin, trulicity 0.75mg  weekly, lipitor, lisinopril 20mg , aspirin 81mg   Lab Results  Component Value Date   HGBA1C 7.4 (A) 05/12/2019    Hypertension: Patient here for follow-up of elevated blood pressure. She is exercising and is adherent to low salt diet.  Blood pressure is well controlled at home. Cardiac symptoms none. Patient denies chest pain, dyspnea, exertional chest pressure/discomfort, irregular heart beat, near-syncope and orthopnea.  Cardiovascular risk factors: advanced age (older than 7 for men, 58 for women), diabetes mellitus, dyslipidemia and hypertension. Use of agents associated with hypertension: none. History of target organ damage: none. BP Readings from Last 3 Encounters:  08/11/19 134/77  06/19/19 (!) 170/81  05/12/19 (!) 145/73      Past Medical History:  Diagnosis Date  . Anemia associated with stage 3 chronic renal failure 03/11/2017  . Diabetes mellitus without complication (HCC)   . High cholesterol   . Hypertension   . Iron deficiency anemia 03/11/2017  . Sarcoidosis     Past Surgical History:  Procedure Laterality Date  . ABDOMINAL HYSTERECTOMY      Family History  Problem Relation Age of  Onset  . High blood pressure Mother   . Diabetes Father     Social History   Socioeconomic History  . Marital status: Married    Spouse name: Zynzulu  . Number of children: 4  . Years of education: Not on file  . Highest education level: Not on file  Occupational History  . Not on file  Tobacco Use  . Smoking status: Never Smoker  . Smokeless tobacco: Never Used  Substance and Sexual Activity  . Alcohol use: No  . Drug use: No  . Sexual activity: Not Currently  Other Topics Concern  . Not on file  Social History Narrative  . Not on file   Social Determinants of Health   Financial Resource Strain:   . Difficulty of Paying Living Expenses:   Food Insecurity:   . Worried About 14/06/2016 in the Last Year:   . 14/06/2016 in the Last Year:   Transportation Needs:   . Programme researcher, broadcasting/film/video (Medical):   Barista Lack of Transportation (Non-Medical):   Physical Activity:   . Days of Exercise per Week:   . Minutes of Exercise per Session:   Stress:   . Feeling of Stress :   Social Connections:   . Frequency of Communication with Friends and Family:   . Frequency of Social Gatherings with Friends and Family:   . Attends Religious Services:   . Active Member of Clubs or Organizations:   . Attends Freight forwarder Meetings:   Marland Kitchen Marital Status:   Intimate Partner Violence:   . Fear of Current or Ex-Partner:   .  Emotionally Abused:   Marland Kitchen Physically Abused:   . Sexually Abused:     Outpatient Medications Prior to Visit  Medication Sig Dispense Refill  . albuterol (PROVENTIL HFA;VENTOLIN HFA) 108 (90 Base) MCG/ACT inhaler Inhale 2 puffs into the lungs every 4 (four) hours as needed for wheezing or shortness of breath (cough, shortness of breath or wheezing.). 1 Inhaler 1  . atorvastatin (LIPITOR) 20 MG tablet Take 1 tablet by mouth once daily 90 tablet 0  . Continuous Blood Gluc Receiver (FREESTYLE LIBRE 14 DAY READER) DEVI 1 Device by Does not apply route daily  as needed. Use as directed. 1 each 0  . Continuous Blood Gluc Sensor (FREESTYLE LIBRE 14 DAY SENSOR) MISC USE AS DIRECTED 2 each 2  . Dulaglutide (TRULICITY) 0.75 MG/0.5ML SOPN Inject 0.75 mg into the skin once a week. 12 pen 1  . ferrous sulfate 325 (65 FE) MG EC tablet Take 1 tablet (325 mg total) by mouth daily. 90 tablet 1  . Insulin Glargine (BASAGLAR KWIKPEN) 100 UNIT/ML SOPN Inject 0.25 mLs (25 Units total) into the skin daily. 12 mL 1  . lisinopril (ZESTRIL) 20 MG tablet Take 1 tablet by mouth once daily 90 tablet 0  . metFORMIN (GLUCOPHAGE) 500 MG tablet TAKE 1 TABLET BY MOUTH TWICE DAILY WITH A MEAL 180 tablet 0  . mometasone-formoterol (DULERA) 200-5 MCG/ACT AERO Inhale 2 puffs into the lungs 2 (two) times daily.    . Multiple Vitamin (MULTIVITAMIN WITH MINERALS) TABS tablet Take 1 tablet by mouth daily.    Marland Kitchen aspirin EC 81 MG tablet Take 81 mg by mouth.     No facility-administered medications prior to visit.    No Known Allergies  ROS Review of Systems Review of Systems  Constitutional: Negative for activity change, appetite change, chills and fever.  HENT: Negative for congestion, nosebleeds, trouble swallowing and voice change.   Respiratory: Negative for cough, shortness of breath and wheezing.   Gastrointestinal: Negative for diarrhea, nausea and vomiting.  Genitourinary: Negative for difficulty urinating, dysuria, flank pain and hematuria.  Musculoskeletal: Negative for back pain, joint swelling and neck pain.  Neurological: Negative for dizziness, speech difficulty, light-headedness and numbness.  See HPI. All other review of systems negative.     Objective:    Physical Exam  BP 134/77 (BP Location: Right Arm, Patient Position: Sitting, Cuff Size: Normal)   Pulse 73   Temp 98 F (36.7 C) (Temporal)   Resp 17   Ht 5\' 10"  (1.778 m)   Wt 198 lb 6.4 oz (90 kg)   SpO2 96%   BMI 28.47 kg/m  Wt Readings from Last 3 Encounters:  08/11/19 198 lb 6.4 oz (90 kg)    06/19/19 201 lb 3.2 oz (91.3 kg)  05/12/19 197 lb 3.2 oz (89.4 kg)   Physical Exam  Constitutional: Oriented to person, place, and time. Appears well-developed and well-nourished.  HENT:  Head: Normocephalic and atraumatic.  Eyes: Conjunctivae and EOM are normal.  Cardiovascular: Normal rate, regular rhythm, normal heart sounds and intact distal pulses.  No murmur heard. Pulmonary/Chest: Effort normal and breath sounds normal. No stridor. No respiratory distress. Has no wheezes.  Neurological: Is alert and oriented to person, place, and time.  Skin: Skin is warm. Capillary refill takes less than 2 seconds.  Psychiatric: Has a normal mood and affect. Behavior is normal. Judgment and thought content normal.    Health Maintenance Due  Topic Date Due  . COVID-19 Vaccine (1) Never done  There are no preventive care reminders to display for this patient.  Lab Results  Component Value Date   TSH 3.500 08/07/2017   Lab Results  Component Value Date   WBC 4.3 05/12/2019   HGB 10.9 (L) 05/12/2019   HCT 33.0 (L) 05/12/2019   MCV 83 05/12/2019   PLT 194 05/12/2019   Lab Results  Component Value Date   NA 140 05/12/2019   K 4.5 05/12/2019   CO2 23 05/12/2019   GLUCOSE 134 (H) 05/12/2019   BUN 16 05/12/2019   CREATININE 0.86 05/12/2019   BILITOT 0.7 05/12/2019   ALKPHOS 65 05/12/2019   AST 37 05/12/2019   ALT 25 05/12/2019   PROT 7.3 05/12/2019   ALBUMIN 4.0 05/12/2019   CALCIUM 9.3 05/12/2019   ANIONGAP 8 02/05/2018   Lab Results  Component Value Date   CHOL 138 02/03/2019   Lab Results  Component Value Date   HDL 68 02/03/2019   Lab Results  Component Value Date   LDLCALC 54 02/03/2019   Lab Results  Component Value Date   TRIG 82 02/03/2019   Lab Results  Component Value Date   CHOLHDL 2.0 02/03/2019   Lab Results  Component Value Date   HGBA1C 7.4 (A) 05/12/2019      Assessment & Plan:   Problem List Items Addressed This Visit       Cardiovascular and Mediastinum   Essential hypertension - Patient's blood pressure is at goal of 139/89 or less. Condition is stable. Continue current medications and treatment plan. I recommend that you exercise for 30-45 minutes 5 days a week. I also recommend a balanced diet with fruits and vegetables every day, lean meats, and little fried foods. The DASH diet (you can find this online) is a good example of this.     Other Visit Diagnoses    Type 2 diabetes mellitus with hypoglycemia without coma, with long-term current use of insulin ([place])    -  Primary well controlled hemoglobin a1c is at goal Continue exercise Lipids monitored and renal function in range Reviewed diabetic foot care Emphasized importance of eye and dental exam         No orders of the defined types were placed in this encounter.   Follow-up: No follow-ups on file.    Forrest Moron, MD

## 2019-08-11 NOTE — Patient Instructions (Signed)
° ° ° °  If you have lab work done today you will be contacted with your lab results within the next 2 weeks.  If you have not heard from us then please contact us. The fastest way to get your results is to register for My Chart. ° ° °IF you received an x-ray today, you will receive an invoice from [facility]. Please contact  Radiology at [phone] with questions or concerns regarding your invoice.  ° °IF you received labwork today, you will receive an invoice from [facility]. Please contact [facility] at [phone] with questions or concerns regarding your invoice.  ° °Our billing staff will not be able to assist you with questions regarding bills from these companies. ° °You will be contacted with the lab results as soon as they are available. The fastest way to get your results is to activate your My Chart account. Instructions are located on the last page of this paperwork. If you have not heard from us regarding the results in 2 weeks, please contact this office. °  ° ° ° °

## 2019-08-12 DIAGNOSIS — E669 Obesity, unspecified: Secondary | ICD-10-CM | POA: Diagnosis not present

## 2019-08-12 DIAGNOSIS — E119 Type 2 diabetes mellitus without complications: Secondary | ICD-10-CM | POA: Diagnosis not present

## 2019-08-12 DIAGNOSIS — G8929 Other chronic pain: Secondary | ICD-10-CM | POA: Diagnosis not present

## 2019-08-12 DIAGNOSIS — I10 Essential (primary) hypertension: Secondary | ICD-10-CM | POA: Diagnosis not present

## 2019-08-12 DIAGNOSIS — R0602 Shortness of breath: Secondary | ICD-10-CM | POA: Diagnosis not present

## 2019-08-12 DIAGNOSIS — E785 Hyperlipidemia, unspecified: Secondary | ICD-10-CM | POA: Diagnosis not present

## 2019-08-12 DIAGNOSIS — D509 Iron deficiency anemia, unspecified: Secondary | ICD-10-CM | POA: Diagnosis not present

## 2019-08-12 DIAGNOSIS — Z008 Encounter for other general examination: Secondary | ICD-10-CM | POA: Diagnosis not present

## 2019-08-12 DIAGNOSIS — Z794 Long term (current) use of insulin: Secondary | ICD-10-CM | POA: Diagnosis not present

## 2019-08-12 DIAGNOSIS — R69 Illness, unspecified: Secondary | ICD-10-CM | POA: Diagnosis not present

## 2019-08-12 DIAGNOSIS — R062 Wheezing: Secondary | ICD-10-CM | POA: Diagnosis not present

## 2019-08-12 LAB — LIPID PANEL
Chol/HDL Ratio: 2.2 ratio (ref 0.0–4.4)
Cholesterol, Total: 146 mg/dL (ref 100–199)
HDL: 65 mg/dL (ref >39)
LDL Chol Calc (NIH): 69 mg/dL (ref 0–99)
Triglycerides: 54 mg/dL (ref 0–149)
VLDL Cholesterol Cal: 12 mg/dL (ref 5–40)

## 2019-08-13 ENCOUNTER — Other Ambulatory Visit: Payer: Self-pay | Admitting: Pharmacy Technician

## 2019-08-13 NOTE — Patient Outreach (Signed)
[facility]) Care Management  08/13/2019  Jeanette Ellis Jan 05, 1951 [id number]   Care coordination call placed to AZ&ME in regards to patient's Symbicort application.  Spoke to [place] who informed patient was APPROVED 08/11/2019-04/08/2020. She informed a shipment was in progress and that the mediation should arrive to patient's home in next 7-10 business days.  Will follow up with patient in 7-10 business days to discuss refill procedure and to confirm receipt of medication.  Jeanette Ellis P. Jibran Crookshanks, CPhT [facility] [employer]  [phone]

## 2019-08-26 ENCOUNTER — Other Ambulatory Visit: Payer: Self-pay | Admitting: Pharmacy Technician

## 2019-08-26 NOTE — Patient Outreach (Signed)
[facility]) Care Management  08/26/2019  Jeanette Ellis [birth year] [id number]   Successful call placed to patient regarding patient assistance medication delivery of Symbicort with AZ&ME, HIPAA identifiers verified.   Patient informed she received her supply of inhalers. Discussed refill procedure with patient which requires the patient to call the company to order the refills. Informed patient where to find the number on the prescription label. Confirmed patient had no other questions or concerns relating to patient assistance and confirmed patient had name and number.  Follow up:  Will remove myself from care team as patient assistance is completed.  Kamaal Cast P. Altheria Shadoan, CPhT [facility] [employer]  [phone]

## 2019-08-31 ENCOUNTER — Telehealth: Payer: Self-pay

## 2019-08-31 ENCOUNTER — Other Ambulatory Visit: Payer: Self-pay

## 2019-08-31 DIAGNOSIS — E1165 Type 2 diabetes mellitus with hyperglycemia: Secondary | ICD-10-CM

## 2019-08-31 NOTE — Telephone Encounter (Signed)
Telephoned in the dexcom g6 sensor to [facility] pharmacy [phone] as the dexcom g5 is no longer available.  Rx left on vm per pharmacist.  Advised to call office with any further questions of concerns.

## 2019-09-04 ENCOUNTER — Other Ambulatory Visit: Payer: Self-pay | Admitting: Family Medicine

## 2019-09-16 DIAGNOSIS — M150 Primary generalized (osteo)arthritis: Secondary | ICD-10-CM | POA: Diagnosis not present

## 2019-09-16 DIAGNOSIS — D869 Sarcoidosis, unspecified: Secondary | ICD-10-CM | POA: Diagnosis not present

## 2019-09-16 DIAGNOSIS — M545 Low back pain: Secondary | ICD-10-CM | POA: Diagnosis not present

## 2019-09-16 DIAGNOSIS — R768 Other specified abnormal immunological findings in serum: Secondary | ICD-10-CM | POA: Diagnosis not present

## 2019-09-16 DIAGNOSIS — E663 Overweight: Secondary | ICD-10-CM | POA: Diagnosis not present

## 2019-09-16 DIAGNOSIS — Z6829 Body mass index (BMI) 29.0-29.9, adult: Secondary | ICD-10-CM | POA: Diagnosis not present

## 2019-09-22 ENCOUNTER — Other Ambulatory Visit: Payer: Self-pay

## 2019-09-22 DIAGNOSIS — E785 Hyperlipidemia, unspecified: Secondary | ICD-10-CM

## 2019-09-22 MED ORDER — ATORVASTATIN CALCIUM 20 MG PO TABS: 20.0000 mg | ORAL_TABLET | Freq: Every day | ORAL | 0 refills | Status: DC

## 2019-10-16 ENCOUNTER — Telehealth: Payer: Self-pay

## 2019-10-16 DIAGNOSIS — D508 Other iron deficiency anemias: Secondary | ICD-10-CM

## 2019-10-16 MED ORDER — FERROUS SULFATE 325 (65 FE) MG PO TBEC: 325.0000 mg | DELAYED_RELEASE_TABLET | Freq: Every day | ORAL | 0 refills | Status: DC

## 2019-10-16 NOTE — Telephone Encounter (Signed)
Pt is following Dr. Creta Levin

## 2019-10-16 NOTE — Telephone Encounter (Signed)
Patient due for [facility] for  to 4 month follow up with new provider. Medications refilled.

## 2019-12-04 DIAGNOSIS — H35033 Hypertensive retinopathy, bilateral: Secondary | ICD-10-CM | POA: Diagnosis not present

## 2019-12-04 DIAGNOSIS — H35373 Puckering of macula, bilateral: Secondary | ICD-10-CM | POA: Diagnosis not present

## 2019-12-04 DIAGNOSIS — H2513 Age-related nuclear cataract, bilateral: Secondary | ICD-10-CM | POA: Diagnosis not present

## 2019-12-04 DIAGNOSIS — E113313 Type 2 diabetes mellitus with moderate nonproliferative diabetic retinopathy with macular edema, bilateral: Secondary | ICD-10-CM | POA: Diagnosis not present

## 2019-12-16 ENCOUNTER — Other Ambulatory Visit: Payer: Self-pay | Admitting: Family Medicine

## 2019-12-16 DIAGNOSIS — E785 Hyperlipidemia, unspecified: Secondary | ICD-10-CM

## 2020-02-04 DIAGNOSIS — E1165 Type 2 diabetes mellitus with hyperglycemia: Secondary | ICD-10-CM | POA: Diagnosis not present

## 2020-02-09 DIAGNOSIS — Z1231 Encounter for screening mammogram for malignant neoplasm of breast: Secondary | ICD-10-CM | POA: Diagnosis not present

## 2020-03-05 DIAGNOSIS — E1165 Type 2 diabetes mellitus with hyperglycemia: Secondary | ICD-10-CM | POA: Diagnosis not present

## 2020-03-09 ENCOUNTER — Other Ambulatory Visit: Payer: Self-pay

## 2020-03-09 DIAGNOSIS — Z794 Long term (current) use of insulin: Secondary | ICD-10-CM

## 2020-03-09 DIAGNOSIS — E119 Type 2 diabetes mellitus without complications: Secondary | ICD-10-CM

## 2020-03-09 NOTE — Telephone Encounter (Signed)
Medication Refill - Medication: Dulaglutide (TRULICITY) 0.75 MG/0.5ML SOPN   Has the patient contacted their pharmacy? Yes.   (Agent: If no, request that the patient contact the pharmacy for the refill.) (Agent: If yes, when and what did the pharmacy advise?)  Preferred Pharmacy (with phone number or street name): [email or web address]  Agent: Please be advised that RX refills may take up to 3 business days. We ask that you follow-up with your pharmacy.

## 2020-03-09 NOTE — Telephone Encounter (Signed)
Requested medication (s) are due for refill today: Yes  Requested medication (s) are on the active medication list: Yes  Last refill:  06/01/19  Future visit scheduled: No  Notes to clinic:  Unable to refill, appointment needed     Requested Prescriptions  Pending Prescriptions Disp Refills   Dulaglutide (TRULICITY) 0.75 MG/0.5ML SOPN      Sig: Inject 0.75 mg into the skin once a week.      Endocrinology:  Diabetes - GLP-1 Receptor Agonists Failed - 03/09/2020  6:05 PM      Failed - HBA1C is between 0 and 7.9 and within 180 days    Hemoglobin A1C  Date Value Ref Range Status  08/11/2019 7.7 (A) 4.0 - 5.6 % Final          Failed - Valid encounter within last 6 months    Recent Outpatient Visits           7 months ago Type 2 diabetes mellitus with hyperglycemia, with long-term current use of insulin (HCC)   Primary Care at [facility], Manus Rudd, MD   8 months ago Dysuria   Primary Care at [facility], Lonna Cobb, NP   10 months ago Type 2 diabetes mellitus with hypoglycemia without coma, with long-term current use of insulin [facility])   Primary Care at [facility], [name], MD   1 year ago Type 2 diabetes mellitus with hypoglycemia without coma, with long-term current use of insulin [facility])   Primary Care at [facility], Manus Rudd, MD   1 year ago Essential hypertension   Primary Care at [facility], Manus Rudd, MD

## 2020-03-10 NOTE — Telephone Encounter (Signed)
Pt has followed dr. Creta Levin to her practice.

## 2020-03-10 NOTE — Telephone Encounter (Signed)
Please schedule patient f/u appt for med refills and TOC appt. No refills without appt, Look like patient has been seen by another PCP at another office please verify. If not then we can send in a 30 day supply till appt once scheduled. Send back for med refill once appt has been scheduled

## 2020-03-18 DIAGNOSIS — D860 Sarcoidosis of lung: Secondary | ICD-10-CM | POA: Diagnosis not present

## 2020-03-18 DIAGNOSIS — Z794 Long term (current) use of insulin: Secondary | ICD-10-CM | POA: Diagnosis not present

## 2020-03-18 DIAGNOSIS — Z7984 Long term (current) use of oral hypoglycemic drugs: Secondary | ICD-10-CM | POA: Diagnosis not present

## 2020-03-18 DIAGNOSIS — E11649 Type 2 diabetes mellitus with hypoglycemia without coma: Secondary | ICD-10-CM | POA: Diagnosis not present

## 2020-03-18 DIAGNOSIS — E1165 Type 2 diabetes mellitus with hyperglycemia: Secondary | ICD-10-CM | POA: Diagnosis not present

## 2020-03-23 DIAGNOSIS — E1165 Type 2 diabetes mellitus with hyperglycemia: Secondary | ICD-10-CM | POA: Diagnosis not present

## 2020-04-04 DIAGNOSIS — E1165 Type 2 diabetes mellitus with hyperglycemia: Secondary | ICD-10-CM | POA: Diagnosis not present

## 2020-05-06 LAB — HM DIABETES EYE EXAM

## 2020-05-12 DIAGNOSIS — E1165 Type 2 diabetes mellitus with hyperglycemia: Secondary | ICD-10-CM | POA: Diagnosis not present

## 2020-06-11 DIAGNOSIS — E1165 Type 2 diabetes mellitus with hyperglycemia: Secondary | ICD-10-CM | POA: Diagnosis not present

## 2020-06-30 DIAGNOSIS — E785 Hyperlipidemia, unspecified: Secondary | ICD-10-CM | POA: Diagnosis not present

## 2020-06-30 DIAGNOSIS — I10 Essential (primary) hypertension: Secondary | ICD-10-CM | POA: Diagnosis not present

## 2020-06-30 DIAGNOSIS — Z794 Long term (current) use of insulin: Secondary | ICD-10-CM | POA: Diagnosis not present

## 2020-06-30 DIAGNOSIS — E663 Overweight: Secondary | ICD-10-CM | POA: Diagnosis not present

## 2020-06-30 DIAGNOSIS — G8929 Other chronic pain: Secondary | ICD-10-CM | POA: Diagnosis not present

## 2020-06-30 DIAGNOSIS — R69 Illness, unspecified: Secondary | ICD-10-CM | POA: Diagnosis not present

## 2020-06-30 DIAGNOSIS — D649 Anemia, unspecified: Secondary | ICD-10-CM | POA: Diagnosis not present

## 2020-06-30 DIAGNOSIS — E1165 Type 2 diabetes mellitus with hyperglycemia: Secondary | ICD-10-CM | POA: Diagnosis not present

## 2020-06-30 DIAGNOSIS — D869 Sarcoidosis, unspecified: Secondary | ICD-10-CM | POA: Diagnosis not present

## 2020-06-30 DIAGNOSIS — M1990 Unspecified osteoarthritis, unspecified site: Secondary | ICD-10-CM | POA: Diagnosis not present

## 2020-07-11 DIAGNOSIS — E1165 Type 2 diabetes mellitus with hyperglycemia: Secondary | ICD-10-CM | POA: Diagnosis not present

## 2020-08-16 DIAGNOSIS — Z79899 Other long term (current) drug therapy: Secondary | ICD-10-CM | POA: Diagnosis not present

## 2020-08-16 DIAGNOSIS — M1990 Unspecified osteoarthritis, unspecified site: Secondary | ICD-10-CM | POA: Diagnosis not present

## 2020-08-16 DIAGNOSIS — D509 Iron deficiency anemia, unspecified: Secondary | ICD-10-CM | POA: Diagnosis not present

## 2020-08-16 DIAGNOSIS — Z7984 Long term (current) use of oral hypoglycemic drugs: Secondary | ICD-10-CM | POA: Diagnosis not present

## 2020-08-16 DIAGNOSIS — I10 Essential (primary) hypertension: Secondary | ICD-10-CM | POA: Diagnosis not present

## 2020-08-16 DIAGNOSIS — E1165 Type 2 diabetes mellitus with hyperglycemia: Secondary | ICD-10-CM | POA: Diagnosis not present

## 2020-08-16 DIAGNOSIS — Z1331 Encounter for screening for depression: Secondary | ICD-10-CM | POA: Diagnosis not present

## 2020-08-16 DIAGNOSIS — Z794 Long term (current) use of insulin: Secondary | ICD-10-CM | POA: Diagnosis not present

## 2020-08-30 DIAGNOSIS — D509 Iron deficiency anemia, unspecified: Secondary | ICD-10-CM | POA: Diagnosis not present

## 2020-08-30 DIAGNOSIS — E1165 Type 2 diabetes mellitus with hyperglycemia: Secondary | ICD-10-CM | POA: Diagnosis not present

## 2020-10-08 DIAGNOSIS — E1165 Type 2 diabetes mellitus with hyperglycemia: Secondary | ICD-10-CM | POA: Diagnosis not present

## 2020-11-05 DIAGNOSIS — H52209 Unspecified astigmatism, unspecified eye: Secondary | ICD-10-CM | POA: Diagnosis not present

## 2020-11-05 DIAGNOSIS — H524 Presbyopia: Secondary | ICD-10-CM | POA: Diagnosis not present

## 2020-11-05 DIAGNOSIS — H5203 Hypermetropia, bilateral: Secondary | ICD-10-CM | POA: Diagnosis not present

## 2020-11-07 DIAGNOSIS — E1165 Type 2 diabetes mellitus with hyperglycemia: Secondary | ICD-10-CM | POA: Diagnosis not present

## 2020-12-07 DIAGNOSIS — E1165 Type 2 diabetes mellitus with hyperglycemia: Secondary | ICD-10-CM | POA: Diagnosis not present

## 2020-12-31 NOTE — Progress Notes (Addendum)
   Subjective:    Patient ID: Jeanette Ellis, female    DOB: 01/04/1951, 70 y.o.   MRN: [id number]   CC: New Patient  HPI:  Diabetes Patient's current diabetic medications include Metformin 500mg  BID, basaglar 18 units, trulicity 0.75mg . Tolerating well without side effects.  Patient endorses compliance with these medications. CBG readings averaging in the 79-119.  range.  Pt gets diarrhea with metformin once she restarted. Patient's last A1c was  Lab Results  Component Value Date   HGBA1C 7.0 01/02/2021   HGBA1C 7.7 (A) 08/11/2019   HGBA1C 7.4 (A) 05/12/2019   Denies abdominal pain, blurred vision, polyuria, polydipsia, hypoglycemia. Patient states they understand that diet and exercise can help with her diabetes.  Last Microalbumin, LDL, Creatinine: Lab Results  Component Value Date   LDLCALC 75 01/02/2021   CREATININE 1.04 (H) 01/02/2021     Hypertension: Patient's current antihypertensive  medications include: lisinopril. Compliant with medications and tolerating well without side effects. Denies any SOB, CP, vision changes, LE edema, medication SEs, or symptoms of hypotension.   Most recent creatinine trend:  Lab Results  Component Value Date   CREATININE 1.04 (H) 01/02/2021   CREATININE 0.97 08/11/2019   CREATININE 0.86 05/12/2019     Patient has had a BMP in the past 1 year.   PMH: HTN DM Sarcoidosis Arthritis   PSH: Past Surgical History:  Procedure Laterality Date   ABDOMINAL HYSTERECTOMY      DH: Lipitor  Trulicity  Ferrous sulphate Metformin Basaglar Lisinopril Symbicort  Ferrous sulphate  Allergies NKDA  FH: Family History  Problem Relation Age of Onset   High blood pressure Mother    Diabetes Father     SH:  Who lives at home: husband  01/04/2021 ADLs: Independent 01/04/2021  Work / Education:  retired 01/04/2021 Smoker: never 01/04/2021  EOTH: none 01/04/2021  Illicit drug use: none 01/04/2021  Current Stressors: none  01/04/2021    Preventative Screening Colonoscopy: 20147 Mammogram: 2021 Pap test: n/a DEXA: 2018 Infuenza vaccine: no done  COVID vaccine: x 2 plus booster  Tetanus vaccine: 2020  Pneumonia vaccine: not done  Shingles vaccine: not done    Smoking status reviewed  Review of Systems   Objective:  BP 124/62   Pulse 84   Wt 188 lb 6.4 oz (85.5 kg)   SpO2 99%   BMI 27.03 kg/m  Vitals and nursing note reviewed  General: Alert, no acute distress Cardio: Normal S1 and S2, RRR, no r/m/g Pulm: CTAB, normal work of breathing Abdomen: Bowel sounds normal. Abdomen soft and non-tender.  Extremities: No peripheral edema.  Neuro: Cranial nerves grossly intact    Assessment & Plan:    Essential hypertension BP at goal, continue Lisinopril 20mg .  Hyperlipemia LDL at goal. Continue lipitor 20mg  daily.  Type 2 diabetes mellitus without complication, with long-term current use of insulin (HCC) A1c 7. At goal. Congratulated pt. Recommended she reduces basaglar to 9 units daily due to adequate control and also because she has been having some low blood sugar readings in the morning. Recommended she takes basaglar in the morning instead of the evening. Can reduce dose further in 3 months if A1c well controlled. Goal is to get pt off basaglar in the future. Continue metformin and trulicity at same doses. Obtained BMP today.  Healthcare maintenance Pt received flu and prevnar 20 vaccines today.    No follow-ups on file.   2021 MD, PGY-3

## 2021-01-02 ENCOUNTER — Other Ambulatory Visit: Payer: Self-pay

## 2021-01-02 ENCOUNTER — Encounter: Payer: Self-pay | Admitting: Family Medicine

## 2021-01-02 ENCOUNTER — Ambulatory Visit (INDEPENDENT_AMBULATORY_CARE_PROVIDER_SITE_OTHER): Payer: Medicare HMO | Admitting: Family Medicine

## 2021-01-02 VITALS — BP 124/62 | HR 84 | Wt 188.4 lb

## 2021-01-02 DIAGNOSIS — Z23 Encounter for immunization: Secondary | ICD-10-CM | POA: Diagnosis not present

## 2021-01-02 DIAGNOSIS — I10 Essential (primary) hypertension: Secondary | ICD-10-CM

## 2021-01-02 DIAGNOSIS — Z794 Long term (current) use of insulin: Secondary | ICD-10-CM | POA: Diagnosis not present

## 2021-01-02 DIAGNOSIS — E782 Mixed hyperlipidemia: Secondary | ICD-10-CM | POA: Diagnosis not present

## 2021-01-02 DIAGNOSIS — E119 Type 2 diabetes mellitus without complications: Secondary | ICD-10-CM | POA: Diagnosis not present

## 2021-01-02 DIAGNOSIS — E7800 Pure hypercholesterolemia, unspecified: Secondary | ICD-10-CM | POA: Diagnosis not present

## 2021-01-02 DIAGNOSIS — E1169 Type 2 diabetes mellitus with other specified complication: Secondary | ICD-10-CM

## 2021-01-02 DIAGNOSIS — Z0000 Encounter for general adult medical examination without abnormal findings: Secondary | ICD-10-CM | POA: Diagnosis not present

## 2021-01-02 LAB — POCT GLYCOSYLATED HEMOGLOBIN (HGB A1C): HbA1c, POC (controlled diabetic range): 7 % (ref 0.0–7.0)

## 2021-01-02 MED ORDER — METFORMIN HCL 500 MG PO TABS: 500.0000 mg | ORAL_TABLET | Freq: Two times a day (BID) | ORAL | 0 refills | Status: DC

## 2021-01-02 NOTE — Patient Instructions (Signed)
Thank you for coming to see me today. It was a pleasure. Today we discussed your diabetes. I recommend reducing basaglar to 9 units. I will call you with the results tonight/tomorrow. Continue other medications as is for now.  Follow up in 3 months for A1c.   We will get some labs today.  If they are abnormal or we need to do something about them, I will call you.  If they are normal, I will send you a message on MyChart (if it is active) or a letter in the mail.  If you don't hear from [place] in 2 weeks, please call the office at the number below.   If you have any questions or concerns, please do not hesitate to call the office at [phone].  Best wishes,   Dr Allena Katz

## 2021-01-03 LAB — BASIC METABOLIC PANEL
BUN/Creatinine Ratio: 12 (ref 12–28)
BUN: 12 mg/dL (ref 8–27)
CO2: 25 mmol/L (ref 20–29)
Calcium: 9.9 mg/dL (ref 8.7–10.3)
Chloride: 104 mmol/L (ref 96–106)
Creatinine, Ser: 1.04 mg/dL — ABNORMAL HIGH (ref 0.57–1.00)
Glucose: 85 mg/dL (ref 70–99)
Potassium: 5.2 mmol/L (ref 3.5–5.2)
Sodium: 143 mmol/L (ref 134–144)
eGFR: 57 mL/min/{1.73_m2} — ABNORMAL LOW (ref >59)

## 2021-01-03 LAB — LIPID PANEL
Chol/HDL Ratio: 2.3 ratio (ref 0.0–4.4)
Cholesterol, Total: 164 mg/dL (ref 100–199)
HDL: 71 mg/dL (ref >39)
LDL Chol Calc (NIH): 75 mg/dL (ref 0–99)
Triglycerides: 99 mg/dL (ref 0–149)
VLDL Cholesterol Cal: 18 mg/dL (ref 5–40)

## 2021-01-04 ENCOUNTER — Telehealth: Payer: Self-pay | Admitting: Family Medicine

## 2021-01-04 DIAGNOSIS — Z0000 Encounter for general adult medical examination without abnormal findings: Secondary | ICD-10-CM | POA: Insufficient documentation

## 2021-01-04 NOTE — Telephone Encounter (Signed)
Called pt to discuss lab results with her. Recommended decreasing basaglar dose to 8 units daily as A1c is well controlled at 7. Follow up in 3 months.

## 2021-01-04 NOTE — Assessment & Plan Note (Signed)
LDL at goal. Continue lipitor 20mg  daily.

## 2021-01-04 NOTE — Assessment & Plan Note (Signed)
BP at goal, continue Lisinopril 20mg .

## 2021-01-04 NOTE — Assessment & Plan Note (Addendum)
A1c 7. At goal. Congratulated pt. Recommended she reduces basaglar to 9 units daily due to adequate control and also because she has been having some low blood sugar readings in the morning. Recommended she takes basaglar in the morning instead of the evening. Can reduce dose further in 3 months if A1c well controlled. Goal is to get pt off basaglar in the future. Continue metformin and trulicity at same doses. Obtained BMP today.

## 2021-01-04 NOTE — Assessment & Plan Note (Signed)
Pt received flu and prevnar 20 vaccines today. 

## 2021-01-14 DIAGNOSIS — E1165 Type 2 diabetes mellitus with hyperglycemia: Secondary | ICD-10-CM | POA: Diagnosis not present

## 2021-01-27 ENCOUNTER — Telehealth: Payer: Self-pay

## 2021-01-27 NOTE — Telephone Encounter (Signed)
Left return call phone number [phone] (in regards to patient assistance re-enrollment for AZ&ME and [employer]).

## 2021-01-31 NOTE — Progress Notes (Signed)
Submitted NEW RX for SYMBICORT 160/4. to [employer] for patient assistance.   2023 RE-ENROLLMENT AUTOMATICALLY SUBMITTED.   Phone: [phone]

## 2021-02-13 DIAGNOSIS — E1165 Type 2 diabetes mellitus with hyperglycemia: Secondary | ICD-10-CM | POA: Diagnosis not present

## 2021-02-14 ENCOUNTER — Other Ambulatory Visit: Payer: Self-pay

## 2021-02-14 DIAGNOSIS — E785 Hyperlipidemia, unspecified: Secondary | ICD-10-CM

## 2021-02-14 DIAGNOSIS — I10 Essential (primary) hypertension: Secondary | ICD-10-CM

## 2021-02-14 DIAGNOSIS — Z1231 Encounter for screening mammogram for malignant neoplasm of breast: Secondary | ICD-10-CM | POA: Diagnosis not present

## 2021-02-16 MED ORDER — ATORVASTATIN CALCIUM 20 MG PO TABS: 20.0000 mg | ORAL_TABLET | Freq: Every day | ORAL | 0 refills | Status: DC

## 2021-02-16 MED ORDER — LISINOPRIL 20 MG PO TABS: 20.0000 mg | ORAL_TABLET | Freq: Every day | ORAL | 0 refills | Status: DC

## 2021-03-09 ENCOUNTER — Encounter: Payer: Self-pay | Admitting: Family Medicine

## 2021-03-14 ENCOUNTER — Ambulatory Visit: Payer: Medicare HMO | Admitting: Family Medicine

## 2021-03-15 DIAGNOSIS — E1165 Type 2 diabetes mellitus with hyperglycemia: Secondary | ICD-10-CM | POA: Diagnosis not present

## 2021-03-17 ENCOUNTER — Encounter: Payer: Self-pay | Admitting: Family Medicine

## 2021-03-21 NOTE — Progress Notes (Signed)
° ° °  SUBJECTIVE:   CHIEF COMPLAINT / HPI: diabetes check and foot pain  Diabetes Type 2 For repeat A1c.  Compliant with medications.  Taking Glargine 9 units daily, moslty afternoon.  Has Dexcom and average monthly sugar 148.  Has had some hypoglycemic events at night, as low as 58 but asymptomatic. Takes candy to increase level.  Denies any chest pain, shortness of breath, visual disturbances, abdominal pain, polyuria, polydipsia. Other medications include Trulicity 0.75 mg weekly, Metformin 500 mg BID.  On statin and ACEi.   Foot pain Had been walking on treadmill regularly.  Noticed increase in pain in instep of left foot 3 weeks ago.  Thought was related to poor foot wear.  Stopped exercising and reports pain has improved.  Denies any trauma, swelling or fevers.   PERTINENT  PMH / PSH:   DM Type 2  OBJECTIVE:   BP 108/60    Pulse 74    Ht 5\' 10"  (1.778 m)    Wt 175 lb 12.8 oz (79.7 kg)    SpO2 98%    BMI 25.22 kg/m    General: Alert, no acute distress Cardio: Normal S1 and S2, RRR, no r/m/g Pulm: CTAB, normal work of breathing Abdomen: Bowel sounds normal. Abdomen soft and non-tender.  Extremities: No peripheral edema. Diabetic foot exam: No deformities, ulcerations, or other skin breakdown on feet bilaterally. No erythema, swelling or tenderness. Full ROM wnl.  Sensation intact to monofilament and light touch.  PT and DP pulses intact BL.   ASSESSMENT/PLAN:   Type 2 diabetes mellitus without complication, with long-term current use of insulin (HCC) A1c remains at goal.  Having hypoglycemic events at night taking 9u Lantus -Decrease Lantus to 5 u with breakfast -Continue Trulicity 0.75mg  weekly -Continue Metformin 500 mg BID -Encourage healthy lifestyle -Continue Statin and ACEi -Strict hypoglycemic precautions provided -Strict return precautions -Follow up scheduled 12/22   Essential hypertension Normotensive today.  Recent Cr wnl Continue current management  Foot  pain, left Likely MSK given now improving and without pain on exam.   Toenails thickened and needs trimming.  Referral to Podiatry at patients request for nail trimming and evaluation for foot wear  Healthcare maintenance Shingrix ordered today. Follow up with PCP for ongoing HCM     1/23, MD [facility]

## 2021-03-21 NOTE — Patient Instructions (Addendum)
Thank you for coming to see me today. It was a pleasure.   You should pay attention to your hemoglobin A1C.  It is a three month test about your average blood sugar. If the A1C is - <7.0 is great.  That is our goal for treating you. - Between 7.0 and 9.0 is not so good.  We would need to work to do better. - Above 9.0 is terrible.  You would really need to work with [place] to get it under control.    Today's A1C = 7.0   Your last A1C in Sept was 7.0  Decrease insulin to 5 units in the morning.  Check your sugars before you eat.  If your sugar level is less than 70, eat first.  If sugars are greater than 400 call MD or go to Emergency department  Recommend Shingles vaccine, prescription sent to pharmacy  Please follow-up 12/22 at 850 am  If you have any questions or concerns, please do not hesitate to call the office at [phone].  Best,   Dana Allan, MD

## 2021-03-22 ENCOUNTER — Other Ambulatory Visit: Payer: Self-pay

## 2021-03-22 ENCOUNTER — Ambulatory Visit (INDEPENDENT_AMBULATORY_CARE_PROVIDER_SITE_OTHER): Payer: Medicare HMO

## 2021-03-22 ENCOUNTER — Encounter: Payer: Self-pay | Admitting: Family Medicine

## 2021-03-22 ENCOUNTER — Ambulatory Visit (INDEPENDENT_AMBULATORY_CARE_PROVIDER_SITE_OTHER): Payer: Medicare HMO | Admitting: Family Medicine

## 2021-03-22 VITALS — BP 108/60 | HR 74 | Ht 70.0 in | Wt 175.8 lb

## 2021-03-22 DIAGNOSIS — Z0000 Encounter for general adult medical examination without abnormal findings: Secondary | ICD-10-CM

## 2021-03-22 DIAGNOSIS — Z794 Long term (current) use of insulin: Secondary | ICD-10-CM | POA: Diagnosis not present

## 2021-03-22 DIAGNOSIS — M79672 Pain in left foot: Secondary | ICD-10-CM | POA: Diagnosis not present

## 2021-03-22 DIAGNOSIS — Z23 Encounter for immunization: Secondary | ICD-10-CM | POA: Diagnosis not present

## 2021-03-22 DIAGNOSIS — I10 Essential (primary) hypertension: Secondary | ICD-10-CM | POA: Diagnosis not present

## 2021-03-22 DIAGNOSIS — E119 Type 2 diabetes mellitus without complications: Secondary | ICD-10-CM

## 2021-03-22 LAB — POCT GLYCOSYLATED HEMOGLOBIN (HGB A1C): HbA1c, POC (controlled diabetic range): 7 % (ref 0.0–7.0)

## 2021-03-22 MED ORDER — BASAGLAR KWIKPEN 100 UNIT/ML ~~LOC~~ SOPN: 5.0000 [IU] | PEN_INJECTOR | Freq: Every day | SUBCUTANEOUS | 1 refills | Status: DC

## 2021-03-22 MED ORDER — SHINGRIX 50 MCG/0.5ML IM SUSR: 0.5000 mL | Freq: Once | INTRAMUSCULAR | 0 refills | Status: AC

## 2021-03-23 ENCOUNTER — Encounter: Payer: Self-pay | Admitting: Family Medicine

## 2021-03-23 DIAGNOSIS — M79672 Pain in left foot: Secondary | ICD-10-CM | POA: Insufficient documentation

## 2021-03-23 NOTE — Assessment & Plan Note (Signed)
Shingrix ordered today. Follow up with PCP for ongoing HCM

## 2021-03-23 NOTE — Assessment & Plan Note (Signed)
Likely MSK given now improving and without pain on exam.   Toenails thickened and needs trimming.  Referral to Podiatry at patients request for nail trimming and evaluation for foot wear

## 2021-03-23 NOTE — Assessment & Plan Note (Signed)
A1c remains at goal.  Having hypoglycemic events at night taking 9u Lantus -Decrease Lantus to 5 u with breakfast -Continue Trulicity 0.75mg  weekly -Continue Metformin 500 mg BID -Encourage healthy lifestyle -Continue Statin and ACEi -Strict hypoglycemic precautions provided -Strict return precautions -Follow up scheduled 12/22

## 2021-03-23 NOTE — Assessment & Plan Note (Signed)
Normotensive today.  Recent Cr wnl Continue current management

## 2021-03-29 ENCOUNTER — Other Ambulatory Visit: Payer: Self-pay | Admitting: Family Medicine

## 2021-03-30 ENCOUNTER — Ambulatory Visit: Payer: Medicare HMO | Admitting: Family Medicine

## 2021-04-21 DIAGNOSIS — M1990 Unspecified osteoarthritis, unspecified site: Secondary | ICD-10-CM | POA: Diagnosis not present

## 2021-04-21 DIAGNOSIS — D862 Sarcoidosis of lung with sarcoidosis of lymph nodes: Secondary | ICD-10-CM | POA: Diagnosis not present

## 2021-04-21 DIAGNOSIS — Z794 Long term (current) use of insulin: Secondary | ICD-10-CM | POA: Diagnosis not present

## 2021-04-21 DIAGNOSIS — E785 Hyperlipidemia, unspecified: Secondary | ICD-10-CM | POA: Diagnosis not present

## 2021-04-21 DIAGNOSIS — Z833 Family history of diabetes mellitus: Secondary | ICD-10-CM | POA: Diagnosis not present

## 2021-04-21 DIAGNOSIS — Z7951 Long term (current) use of inhaled steroids: Secondary | ICD-10-CM | POA: Diagnosis not present

## 2021-04-21 DIAGNOSIS — I10 Essential (primary) hypertension: Secondary | ICD-10-CM | POA: Diagnosis not present

## 2021-04-21 DIAGNOSIS — Z7985 Long-term (current) use of injectable non-insulin antidiabetic drugs: Secondary | ICD-10-CM | POA: Diagnosis not present

## 2021-04-21 DIAGNOSIS — E119 Type 2 diabetes mellitus without complications: Secondary | ICD-10-CM | POA: Diagnosis not present

## 2021-04-21 DIAGNOSIS — Z8249 Family history of ischemic heart disease and other diseases of the circulatory system: Secondary | ICD-10-CM | POA: Diagnosis not present

## 2021-04-21 DIAGNOSIS — Z7984 Long term (current) use of oral hypoglycemic drugs: Secondary | ICD-10-CM | POA: Diagnosis not present

## 2021-05-02 DIAGNOSIS — E1165 Type 2 diabetes mellitus with hyperglycemia: Secondary | ICD-10-CM | POA: Diagnosis not present

## 2021-05-09 ENCOUNTER — Other Ambulatory Visit (HOSPITAL_COMMUNITY): Payer: Self-pay

## 2021-05-14 ENCOUNTER — Other Ambulatory Visit: Payer: Self-pay | Admitting: Family Medicine

## 2021-05-14 DIAGNOSIS — E785 Hyperlipidemia, unspecified: Secondary | ICD-10-CM

## 2021-05-14 DIAGNOSIS — I10 Essential (primary) hypertension: Secondary | ICD-10-CM

## 2021-05-24 ENCOUNTER — Ambulatory Visit: Payer: Medicare HMO | Admitting: Podiatry

## 2021-05-24 ENCOUNTER — Other Ambulatory Visit: Payer: Self-pay

## 2021-05-24 DIAGNOSIS — M2041 Other hammer toe(s) (acquired), right foot: Secondary | ICD-10-CM | POA: Diagnosis not present

## 2021-05-24 DIAGNOSIS — M2042 Other hammer toe(s) (acquired), left foot: Secondary | ICD-10-CM

## 2021-05-24 DIAGNOSIS — B351 Tinea unguium: Secondary | ICD-10-CM | POA: Diagnosis not present

## 2021-05-24 DIAGNOSIS — M2011 Hallux valgus (acquired), right foot: Secondary | ICD-10-CM | POA: Diagnosis not present

## 2021-05-24 DIAGNOSIS — M2012 Hallux valgus (acquired), left foot: Secondary | ICD-10-CM

## 2021-05-24 DIAGNOSIS — L84 Corns and callosities: Secondary | ICD-10-CM

## 2021-05-24 DIAGNOSIS — E119 Type 2 diabetes mellitus without complications: Secondary | ICD-10-CM | POA: Diagnosis not present

## 2021-05-24 DIAGNOSIS — Z794 Long term (current) use of insulin: Secondary | ICD-10-CM | POA: Diagnosis not present

## 2021-05-24 DIAGNOSIS — M79674 Pain in right toe(s): Secondary | ICD-10-CM | POA: Diagnosis not present

## 2021-05-24 DIAGNOSIS — M79675 Pain in left toe(s): Secondary | ICD-10-CM | POA: Diagnosis not present

## 2021-05-24 NOTE — Patient Instructions (Addendum)
Shoe List For tennis shoes recommend:  [employer] New balance Can be purchased at [employer] sports or [employer] arch fit: Can be purchased at any major retailers Vionic  SAS Can be purchased at [employer]   For casual shoes recommend: Oofos can be purchased at [email or web address]  Diabetes Mellitus and Foot Care Foot care is an important part of your health, especially when you have diabetes. Diabetes may cause you to have problems because of poor blood flow (circulation) to your feet and legs, which can cause your skin to: Become thinner and drier. Break more easily. Heal more slowly. Peel and crack. You may also have nerve damage (neuropathy) in your legs and feet, causing decreased feeling in them. This means that you may not notice minor injuries to your feet that could lead to more serious problems. Noticing and addressing any potential problems early is the best way to prevent future foot problems. How to care for your feet Foot hygiene  Wash your feet daily with warm water and mild soap. Do not use hot water. Then, pat your feet and the areas between your toes until they are completely dry. Do not soak your feet as this can dry your skin. Trim your toenails straight across. Do not dig under them or around the cuticle. File the edges of your nails with an emery board or nail file. Apply a moisturizing lotion or petroleum jelly to the skin on your feet and to dry, brittle toenails. Use lotion that does not contain alcohol and is unscented. Do not apply lotion between your toes. Shoes and socks Wear clean socks or stockings every day. Make sure they are not too tight. Do not wear knee-high stockings since they may decrease blood flow to your legs. Wear shoes that fit properly and have enough cushioning. Always look in your shoes before you put them on to be sure there are no objects inside. To break in new shoes, wear them for just a few hours a day. This prevents injuries  on your feet. Wounds, scrapes, corns, and calluses  Check your feet daily for blisters, cuts, bruises, sores, and redness. If you cannot see the bottom of your feet, use a mirror or ask someone for help. Do not cut corns or calluses or try to remove them with medicine. If you find a minor scrape, cut, or break in the skin on your feet, keep it and the skin around it clean and dry. You may clean these areas with mild soap and water. Do not clean the area with peroxide, alcohol, or iodine. If you have a wound, scrape, corn, or callus on your foot, look at it several times a day to make sure it is healing and not infected. Check for: Redness, swelling, or pain. Fluid or blood. Warmth. Pus or a bad smell. General tips Do not cross your legs. This may decrease blood flow to your feet. Do not use heating pads or hot water bottles on your feet. They may burn your skin. If you have lost feeling in your feet or legs, you may not know this is happening until it is too late. Protect your feet from hot and cold by wearing shoes, such as at the beach or on hot pavement. Schedule a complete foot exam at least once a year (annually) or more often if you have foot problems. Report any cuts, sores, or bruises to your health care provider immediately. Where to find more information American Diabetes Association: www.diabetes.org  Association of Diabetes Care & Education Specialists: www.diabeteseducator.org Contact a health care provider if: You have a medical condition that increases your risk of infection and you have any cuts, sores, or bruises on your feet. You have an injury that is not healing. You have redness on your legs or feet. You feel burning or tingling in your legs or feet. You have pain or cramps in your legs and feet. Your legs or feet are numb. Your feet always feel cold. You have pain around any toenails. Get help right away if: You have a wound, scrape, corn, or callus on your foot  and: You have pain, swelling, or redness that gets worse. You have fluid or blood coming from the wound, scrape, corn, or callus. Your wound, scrape, corn, or callus feels warm to the touch. You have pus or a bad smell coming from the wound, scrape, corn, or callus. You have a fever. You have a red line going up your leg. Summary Check your feet every day for blisters, cuts, bruises, sores, and redness. Apply a moisturizing lotion or petroleum jelly to the skin on your feet and to dry, brittle toenails. Wear shoes that fit properly and have enough cushioning. If you have foot problems, report any cuts, sores, or bruises to your health care provider immediately. Schedule a complete foot exam at least once a year (annually) or more often if you have foot problems. This information is not intended to replace advice given to you by your health care provider. Make sure you discuss any questions you have with your health care provider. Document Revised: 10/15/2019 Document Reviewed: 10/15/2019 Elsevier Patient Education  2022 Elsevier Inc.  Apply Tea Tree Oil to toenails once daily.

## 2021-05-30 ENCOUNTER — Encounter: Payer: Self-pay | Admitting: Podiatry

## 2021-05-30 ENCOUNTER — Ambulatory Visit (INDEPENDENT_AMBULATORY_CARE_PROVIDER_SITE_OTHER): Payer: Medicare HMO | Admitting: Family Medicine

## 2021-05-30 ENCOUNTER — Other Ambulatory Visit: Payer: Self-pay

## 2021-05-30 ENCOUNTER — Encounter: Payer: Self-pay | Admitting: Family Medicine

## 2021-05-30 VITALS — Ht 67.72 in | Wt 178.2 lb

## 2021-05-30 DIAGNOSIS — Z0000 Encounter for general adult medical examination without abnormal findings: Secondary | ICD-10-CM | POA: Diagnosis not present

## 2021-05-30 NOTE — Progress Notes (Signed)
Subjective: Jeanette Ellis presents today referred by Lattie Haw, MD for diabetic foot evaluation.  Today, patient c/o  painful left foot. She relates h/o bunion surgery around 2001 or 2003 with Dr. Paulla Dolly. Patient states she now has discomfort around operative site. Denies any redness, drainage or swelling. Denies any recent trauma to the foot .  Patient relates 30 year history of diabetes.  Patient denies any history of foot wounds.  Patient denies symptoms of numbness or burning in feet.  Patient endorses occasional symptoms of tingling in feet.  Patient relates blood glucose was 119 mg/dl this morning.   PCP is Lattie Haw, MD , and last visit was January 02, 2021.  Past Medical History:  Diagnosis Date   Anemia associated with stage 3 chronic renal failure ([place]) 03/11/2017   Diabetes mellitus without complication (HCC)    High cholesterol    Hypertension    Iron deficiency anemia 03/11/2017   Sarcoidosis     Patient Active Problem List   Diagnosis Date Noted   Foot pain, left 03/23/2021   Healthcare maintenance 01/04/2021   Pelvic pain 06/19/2019   Acute cystitis with hematuria 06/19/2019   Microalbuminuria due to type 2 diabetes mellitus ([place]) 09/19/2017   Sarcoidosis 09/06/2017   Diabetic retinopathy ([place]) 08/21/2017   Iron deficiency anemia 03/11/2017   Anemia associated with stage 3 chronic renal failure ([place]) 03/11/2017   Microscopic hematuria 02/20/2016   Tired 09/08/2015   History of sarcoidosis 06/09/2015   Increased frequency of urination 06/07/2014   Need for pneumococcal vaccine 06/07/2014   Type 2 diabetes mellitus without complication, with long-term current use of insulin ([place]) 01/12/2014   Essential hypertension 09/21/2013   Hyperlipemia 09/21/2013    Past Surgical History:  Procedure Laterality Date   ABDOMINAL HYSTERECTOMY      Current Outpatient Medications on File Prior to Visit  Medication Sig Dispense Refill   metFORMIN  (GLUCOPHAGE-XR) 500 MG 24 hr tablet Take by mouth.     sitaGLIPtin (JANUVIA) 100 MG tablet Take 1 tablet by mouth daily.     albuterol (PROVENTIL HFA;VENTOLIN HFA) 108 (90 Base) MCG/ACT inhaler Inhale 2 puffs into the lungs every 4 (four) hours as needed for wheezing or shortness of breath (cough, shortness of breath or wheezing.). 1 Inhaler 1   atorvastatin (LIPITOR) 20 MG tablet Take 1 tablet by mouth once daily 90 tablet 0   Continuous Blood Gluc Receiver (DEXCOM G5 MOBILE RECEIVER) DEVI Use to check blood glucose E11.65 1 each 1   Continuous Blood Gluc Sensor (DEXCOM G5 MOB/G4 PLAT SENSOR) MISC Use to check blood glucose with meals. E11.65 4 each 1   Continuous Blood Gluc Transmit (DEXCOM G5 MOBILE TRANSMITTER) MISC Use to check blood glucose with meals. E11.65 1 each 1   Dulaglutide (TRULICITY) A999333 0000000 SOPN Inject 0.75 mg into the skin once a week. 12 pen 1   ferrous sulfate 325 (65 FE) MG EC tablet Take 1 tablet (325 mg total) by mouth daily. 90 tablet 0   Insulin Glargine (BASAGLAR KWIKPEN) 100 UNIT/ML Inject 5 Units into the skin daily. At breakfast 12 mL 1   lisinopril (ZESTRIL) 20 MG tablet Take 1 tablet by mouth once daily 90 tablet 0   metFORMIN (GLUCOPHAGE) 500 MG tablet TAKE 1 TABLET BY MOUTH TWICE DAILY WITH A MEAL 180 tablet 0   mometasone-formoterol (DULERA) 200-5 MCG/ACT AERO Inhale 2 puffs into the lungs 2 (two) times daily.     Multiple Vitamin (MULTIVITAMIN WITH MINERALS) TABS tablet  Take 1 tablet by mouth daily.     No current facility-administered medications on file prior to visit.     No Known Allergies  Social History   Occupational History   Not on file  Tobacco Use   Smoking status: Never   Smokeless tobacco: Never  Vaping Use   Vaping Use: Never used  Substance and Sexual Activity   Alcohol use: No   Drug use: No   Sexual activity: Not Currently    Family History  Problem Relation Age of Onset   High blood pressure Mother    Diabetes Father      Immunization History  Administered Date(s) Administered   Fluad Quad(high Dose 65+) 02/03/2019   Influenza, High Dose Seasonal PF 01/13/2018   Influenza,inj,Quad PF,6+ Mos 05/11/2016, 01/02/2021   PFIZER Comirnaty(Gray Top)Covid-19 Tri-Sucrose Vaccine 05/15/2019, 06/05/2019, 03/21/2020   PNEUMOCOCCAL CONJUGATE-20 01/02/2021   Pfizer Covid-19 Vaccine Bivalent Booster 67yrs & up 03/22/2021   Pneumococcal Conjugate-13 06/07/2014   Pneumococcal Polysaccharide-23 09/08/2015   Td 04/21/2018    Objective: There were no vitals filed for this visit.  Jeanette Ellis is a pleasant 71 y.o. female WD, WN in NAD. AAO X 3.  Vascular Examination: CFT <3 seconds b/l LE. Palpable pedal pulses b/l LE. Pedal hair sparse. No pain with calf compression b/l. Lower extremity skin temperature gradient within normal limits. No edema noted b/l LE. No cyanosis or clubbing noted b/l LE.  Dermatological Examination: Pedal integument with normal turgor, texture and tone BLE. Well healed longitudinal surgical scar from bunionectomy noted dorsal aspect 1st metatarsal left foot. No open wounds b/l LE. No interdigital macerations noted b/l LE. Toenails 1-5 b/l elongated, discolored, dystrophic, thickened, crumbly with subungual debris and tenderness to dorsal palpation. Hyperkeratotic lesion(s) L 5th toe.  No erythema, no edema, no drainage, no fluctuance.  Musculoskeletal Examination: Normal muscle strength 5/5 to all lower extremity muscle groups bilaterally. Hallux valgus with bunion deformity noted right lower extremity. Hammertoe deformity noted 2-5 b/l.Marland Kitchen No pain, crepitus or joint limitation noted with ROM b/l LE.  Patient ambulates independently without assistive aids.  Footwear Assessment: Does the patient wear appropriate shoes? Yes. Does the patient need inserts/orthotics? No.  Neurological Examination: Protective sensation intact 5/5 intact bilaterally with 10g monofilament b/l. Vibratory sensation  intact b/l.  A1c:   Hemoglobin A1C Latest Ref Rng & Units 03/22/2021 01/02/2021  HGBA1C 0.0 - 7.0 % 7.0 7.0  Some recent data might be hidden   Assessment: 1. Pain due to onychomycosis of toenails of both feet   2. Corns   3. Hallux valgus, acquired, bilateral   4. Acquired hammertoes of both feet   5. Type 2 diabetes mellitus without complication, with long-term current use of insulin ([place])   6. Encounter for diabetic foot exam ([place])      ADA Risk Categorization: Low Risk:  Patient has all of the following: Intact protective sensation No prior foot ulcer  No severe deformity Pedal pulses present  Plan: -Examined patient. -Diabetic foot examination performed today. -Continue foot and shoe inspections daily. Monitor blood glucose per PCP/Endocrinologist's recommendations. -Patient instructed to continue use of tea tree oil  to affected toenails once daily. -Toenails 1-5 b/l were debrided in length and girth with sterile nail nippers and dremel without iatrogenic bleeding.  -Corn(s) L 5th toe pared utilizing sterile scalpel blade without complication or incident. Total number debrided=1. -Patient given list of shoe recommendations. -Patient referred to Dr. Ila Mcgill for evaluation of painful bunion left foot  s/p bunionectomy >20 years ago. -Patient/POA to call should there be question/concern in the interim.  Return in about 3 months (around 08/21/2021).  Marzetta Board, DPM

## 2021-05-30 NOTE — Progress Notes (Signed)
Subjective:   Jeanette Ellis is a 71 y.o. female who presents for Medicare Annual (Subsequent) preventive examination.  Patient consented to have virtual visit and was identified by name and date of birth. Method of visit: in person  Encounter participants: Patient: [facility] Nurse/Provider: Lattie Haw - [facility] at [facility] Others (if applicable):     Review of Systems:  negative Cardiac Risk Factors include: advanced age (>2men, >6 women);diabetes mellitus;dyslipidemia;hypertension     Objective:     Vitals: Ht 5' 7.72" (1.72 m)    Wt 178 lb 3.2 oz (80.8 kg)    BMI 27.32 kg/m   Body mass index is 27.32 kg/m.  Advanced Directives 05/30/2021 03/22/2021 01/02/2021 01/08/2019 07/23/2018 07/07/2018 05/20/2018  Does Patient Have a Medical Advance Directive? No No No No No No No  Would patient like information on creating a medical advance directive? No - Patient declined No - Patient declined No - Patient declined Yes (MAU/Ambulatory/Procedural Areas - Information given) Yes (MAU/Ambulatory/Procedural Areas - Information given) No - Patient declined Yes (MAU/Ambulatory/Procedural Areas - Information given)    Tobacco Social History   Tobacco Use  Smoking Status Never  Smokeless Tobacco Never     Counseling given: Not Answered   Clinical Intake:           BMI - recorded: 27 Nutritional Status: BMI 25 -29 Overweight Diabetes: Yes  How often do you need to have someone help you when you read instructions, pamphlets, or other written materials from your doctor or pharmacy?: 1 - Never  Interpreter Needed?: No     Past Medical History:  Diagnosis Date   Anemia associated with stage 3 chronic renal failure ([place]) 03/11/2017   Diabetes mellitus without complication ([place])    High cholesterol    Hypertension    Iron deficiency anemia 03/11/2017   Sarcoidosis    Past Surgical History:  Procedure Laterality Date   ABDOMINAL HYSTERECTOMY      Family History  Problem Relation Age of Onset   High blood pressure Mother    Diabetes Father    Social History   Socioeconomic History   Marital status: Married    Spouse name: Advertising account planner   Number of children: 4   Years of education: Not on file   Highest education level: Not on file  Occupational History   Not on file  Tobacco Use   Smoking status: Never   Smokeless tobacco: Never  Vaping Use   Vaping Use: Never used  Substance and Sexual Activity   Alcohol use: No   Drug use: No   Sexual activity: Not Currently  Other Topics Concern   Not on file  Social History Narrative   Not on file   Social Determinants of Health   Financial Resource Strain: Not on file  Food Insecurity: Not on file  Transportation Needs: Not on file  Physical Activity: Not on file  Stress: Not on file  Social Connections: Not on file    Outpatient Encounter Medications as of 05/30/2021  Medication Sig   albuterol (PROVENTIL HFA;VENTOLIN HFA) 108 (90 Base) MCG/ACT inhaler Inhale 2 puffs into the lungs every 4 (four) hours as needed for wheezing or shortness of breath (cough, shortness of breath or wheezing.).   atorvastatin (LIPITOR) 20 MG tablet Take 1 tablet by mouth once daily   Continuous Blood Gluc Receiver (DEXCOM G5 MOBILE RECEIVER) DEVI Use to check blood glucose E11.65   Continuous Blood Gluc Sensor (DEXCOM G5  MOB/G4 PLAT SENSOR) MISC Use to check blood glucose with meals. E11.65   Continuous Blood Gluc Transmit (DEXCOM G5 MOBILE TRANSMITTER) MISC Use to check blood glucose with meals. E11.65   Dulaglutide (TRULICITY) A999333 0000000 SOPN Inject 0.75 mg into the skin once a week.   Insulin Glargine (BASAGLAR KWIKPEN) 100 UNIT/ML Inject 5 Units into the skin daily. At breakfast   lisinopril (ZESTRIL) 20 MG tablet Take 1 tablet by mouth once daily   mometasone-formoterol (DULERA) 200-5 MCG/ACT AERO Inhale 2 puffs into the lungs 2 (two) times daily.   Multiple Vitamin (MULTIVITAMIN WITH  MINERALS) TABS tablet Take 1 tablet by mouth daily.   metFORMIN (GLUCOPHAGE) 500 MG tablet TAKE 1 TABLET BY MOUTH TWICE DAILY WITH A MEAL (Patient taking differently: 500 mg daily with breakfast.)   [DISCONTINUED] ferrous sulfate 325 (65 FE) MG EC tablet Take 1 tablet (325 mg total) by mouth daily. (Patient not taking: Reported on 05/30/2021)   [DISCONTINUED] metFORMIN (GLUCOPHAGE-XR) 500 MG 24 hr tablet Take by mouth.   [DISCONTINUED] sitaGLIPtin (JANUVIA) 100 MG tablet Take 1 tablet by mouth daily.   No facility-administered encounter medications on file as of 05/30/2021.    Activities of Daily Living In your present state of health, do you have any difficulty performing the following activities: 05/30/2021  Hearing? N  Vision? N  Difficulty concentrating or making decisions? N  Walking or climbing stairs? N  Dressing or bathing? N  Doing errands, shopping? N  Preparing Food and eating ? N  Using the Toilet? N  In the past six months, have you accidently leaked urine? N  Do you have problems with loss of bowel control? N  Managing your Medications? N  Managing your Finances? N  Housekeeping or managing your Housekeeping? N  Some recent data might be hidden    Patient Care Team: Lattie Haw, MD as PCP - General (Family Medicine) Patient, No Pcp Per (Inactive) (General Practice)    Assessment:   This is a routine wellness examination for Jeanette Ellis.  Exercise Activities and Dietary recommendations Current Exercise Habits: Home exercise routine, Type of exercise: treadmill;walking, Time (Minutes): 60, Frequency (Times/Week): 5, Weekly Exercise (Minutes/Week): 300, Intensity: Moderate, Exercise limited by: None identified   Goals      Client will verbalize knowledge of self management of Hypertension as evidences by BP reading of 140/90 or less; or as defined by provider     DIET - EAT MORE FRUITS AND VEGETABLES     Increase walking      Set My Weight Loss Goal     Follow Up  Date 06/27/21    - set weight loss goal 8lb    Why is this important?   Losing only 5 to 15 percent of your weight makes a big difference in your health.  To get off some of the medications: metformin, insulin    Notes:         Fall Risk Fall Risk  05/30/2021 01/02/2021 08/11/2019 06/19/2019 05/12/2019  Falls in the past year? 0 0 0 0 0  Number falls in past yr: 0 - 0 0 0  Injury with Fall? 0 - 0 0 0  Follow up - - Falls evaluation completed Falls evaluation completed Falls evaluation completed   Is the patient's home free of loose throw rugs in walkways, pet beds, electrical cords, etc?   no      Grab bars in the bathroom? no      Handrails on the stairs?  yes      Adequate lighting?   yes  Patient rating of health (0-10) scale:    Depression Screen PHQ 2/9 Scores 05/30/2021 08/11/2019 06/19/2019 05/12/2019  PHQ - 2 Score 1 0 0 0  PHQ- 9 Score 5 - - -     Cognitive Function     6CIT Screen 05/30/2021 07/07/2018  What Year? 0 points 0 points  What month? 0 points 0 points  What time? 0 points 0 points  Count back from 20 0 points 0 points  Months in reverse 0 points 0 points  Repeat phrase 0 points 0 points  Total Score 0 0    Immunization History  Administered Date(s) Administered   Fluad Quad(high Dose 65+) 02/03/2019   Influenza, High Dose Seasonal PF 01/13/2018   Influenza,inj,Quad PF,6+ Mos 05/11/2016, 01/02/2021   PFIZER Comirnaty(Gray Top)Covid-19 Tri-Sucrose Vaccine 05/15/2019, 06/05/2019, 03/21/2020   PNEUMOCOCCAL CONJUGATE-20 01/02/2021   Pfizer Covid-19 Vaccine Bivalent Booster 43yrs & up 03/22/2021   Pneumococcal Conjugate-13 06/07/2014   Pneumococcal Polysaccharide-23 09/08/2015   Td 04/21/2018     Screening Tests Health Maintenance  Topic Date Due   Hepatitis C Screening  Never done   Zoster Vaccines- Shingrix (1 of 2) Never done   OPHTHALMOLOGY EXAM  05/06/2021   HEMOGLOBIN A1C  09/20/2021   FOOT EXAM  05/24/2022   COLONOSCOPY (Pts 45-76yrs  Insurance coverage will need to be confirmed)  11/11/2022   MAMMOGRAM  02/15/2023   TETANUS/TDAP  04/21/2028   Pneumonia Vaccine 42+ Years old  Completed   INFLUENZA VACCINE  Completed   DEXA SCAN  Completed   COVID-19 Vaccine  Completed   HPV VACCINES  Aged Out    Cancer Screenings: Lung: Low Dose CT Chest recommended if Age 71-80 years, 20 pack-year currently smoking OR have quit w/in 15years. Patient does not qualify. Breast:  Up to date on Mammogram? Yes   Up to date of Bone Density/Dexa? Yes Colorectal: yes  Additional Screenings: : Hepatitis C Screening: due      Plan:    I have personally reviewed and noted the following in the patients chart:   Medical and social history Use of alcohol, tobacco or illicit drugs  Current medications and supplements Functional ability and status Nutritional status Physical activity Advanced directives List of other physicians Hospitalizations, surgeries, and ER visits in previous 12 months Vitals Screenings to include cognitive, depression, and falls Referrals and appointments  In addition, I have reviewed and discussed with patient certain preventive protocols, quality metrics, and best practice recommendations. A written personalized care plan for preventive services as well as general preventive health recommendations were provided to patient.    This visit was conducted virtually in the setting of the [place] pandemic.    Lattie Haw, MD  05/30/2021

## 2021-05-30 NOTE — Patient Instructions (Signed)
Thank you for coming to see me today. It was a pleasure. Today we discussed your sugar levels. They sound like they are too low. Should >120 fasting. I recommend: Decreasing Lantus 3 units Monitor blood sugars morning only  Please follow-up with me in 1 week  If you have any questions or concerns, please do not hesitate to call the office at [phone].  Best wishes,   Dr Allena Katz

## 2021-06-01 DIAGNOSIS — E1165 Type 2 diabetes mellitus with hyperglycemia: Secondary | ICD-10-CM | POA: Diagnosis not present

## 2021-06-02 ENCOUNTER — Ambulatory Visit: Payer: Medicare HMO | Admitting: Podiatry

## 2021-06-02 ENCOUNTER — Ambulatory Visit (INDEPENDENT_AMBULATORY_CARE_PROVIDER_SITE_OTHER): Payer: Medicare HMO

## 2021-06-02 ENCOUNTER — Other Ambulatory Visit: Payer: Self-pay

## 2021-06-02 ENCOUNTER — Encounter: Payer: Self-pay | Admitting: Podiatry

## 2021-06-02 DIAGNOSIS — M779 Enthesopathy, unspecified: Secondary | ICD-10-CM

## 2021-06-02 DIAGNOSIS — M2012 Hallux valgus (acquired), left foot: Secondary | ICD-10-CM

## 2021-06-02 DIAGNOSIS — M2011 Hallux valgus (acquired), right foot: Secondary | ICD-10-CM | POA: Diagnosis not present

## 2021-06-02 DIAGNOSIS — M21619 Bunion of unspecified foot: Secondary | ICD-10-CM | POA: Diagnosis not present

## 2021-06-02 DIAGNOSIS — M21612 Bunion of left foot: Secondary | ICD-10-CM | POA: Diagnosis not present

## 2021-06-02 NOTE — Progress Notes (Signed)
Subjective:   Patient ID: Jeanette Ellis, female   DOB: 71 y.o.   MRN: [id number]   HPI Patient presents stating that she is getting   ROS      Objective:  Physical Exam  Some pain on the top of her left foot she is concerned about a bony injury stating its not severe but it is bothersome and she wanted it checked     Assessment:  Neurovascular status was found to be intact with pain of the dorsum of the left foot localized and it is not severe     Plan:  Review condition and discussed tendinitis of a low-grade nature possibility for ligament damage but did do precautionary x-ray.  She will use topical medicines on a currently and if it does not get better we will consider cortisone injection  X-rays were negative for signs of bone changes appears to be soft tissue with history of bunion correction with pin in place moderate arthritis around the joint but joint overall functioning well clinically

## 2021-06-06 ENCOUNTER — Other Ambulatory Visit: Payer: Self-pay

## 2021-06-06 ENCOUNTER — Ambulatory Visit (INDEPENDENT_AMBULATORY_CARE_PROVIDER_SITE_OTHER): Payer: Medicare HMO | Admitting: Family Medicine

## 2021-06-06 ENCOUNTER — Encounter: Payer: Self-pay | Admitting: Family Medicine

## 2021-06-06 DIAGNOSIS — Z794 Long term (current) use of insulin: Secondary | ICD-10-CM | POA: Diagnosis not present

## 2021-06-06 DIAGNOSIS — E119 Type 2 diabetes mellitus without complications: Secondary | ICD-10-CM

## 2021-06-06 DIAGNOSIS — I10 Essential (primary) hypertension: Secondary | ICD-10-CM | POA: Diagnosis not present

## 2021-06-06 NOTE — Progress Notes (Signed)
° ° ° °  SUBJECTIVE:   CHIEF COMPLAINT / HPI:   Jeanette Ellis is a 71 y.o. female presents for diabetes follow up  Diabetes Patient's current diabetic medications include trulicity, metformin 500mg  BID and lantus 3 units. Tolerating well without side effects.  Patient endorses compliance with these medications. Fasting CBG readings averaging in the 120-130 range.  Switched lantus to 3 units. Patient's last A1c was  Lab Results  Component Value Date   HGBA1C 7.0 03/22/2021   HGBA1C 7.0 01/02/2021   HGBA1C 7.7 (A) 08/11/2019    Denies abdominal pain, blurred vision, polyuria, polydipsia, hypoglycemia. Patient states they understand that diet and exercise can help with her diabetes. Her goal is reduce the amount of meds she takes   Hypertension Patient's current antihypertensive  medications include: Lisinopril.  Has not taken her medication today. Compliant with medications and tolerating well without side effects. Denies any SOB, CP, vision changes, LE edema, medication SEs, or symptoms of hypotension.   Most recent creatinine trend:  Lab Results  Component Value Date   CREATININE 1.04 (H) 01/02/2021   CREATININE 0.97 08/11/2019   CREATININE 0.86 05/12/2019     Patient has had a BMP in the past 1 year.   Last Microalbumin, LDL, Creatinine: Lab Results  Component Value Date   LDLCALC 75 01/02/2021   CREATININE 1.04 (H) 01/02/2021    Flowsheet Row Office Visit from 06/06/2021 in [facility]  PHQ-9 Total Score 1        PERTINENT  PMH / PSH: DM, HTN  OBJECTIVE:   BP (!) 153/73    Pulse 85    Wt 177 lb 3.2 oz (80.4 kg)    SpO2 98%    BMI 27.17 kg/m    Repeat BP 131/73  General: Alert, no acute distress Cardio: well perfused  Pulm: normal work of breathing Neuro: Cranial nerves grossly intact   ASSESSMENT/PLAN:   Essential hypertension Initial BP elevated to 153/73. Repeat BP 131/73. At goal. Continue Lisinopril.   Type 2 diabetes mellitus  without complication, with long-term current use of insulin (HCC) Fasting CBGs 120-130. No CBGs < 100 on Lantus 3 units. No hypoglycemic symptoms. Continue lantus 3 units, trulicity 0.75mg  weekly and metformin 500mg  BID. F/u a1c in 1 month. Goal is to get pt off lantus if possible.    [place], MD PGY-3 [facility]

## 2021-06-06 NOTE — Patient Instructions (Signed)
Thank you for coming to see me today. It was a pleasure. Today we discussed your blood sugars, I am happy with where they are right ow. Continue lantus 3 units. Continue the rest the same.  Please follow-up with me in 3-4 weeks for diabetic check, we will do labs then too   Shingles vaccines- get at pharmacy   If you have any questions or concerns, please do not hesitate to call the office at [phone].  Best wishes,   Dr Allena Katz

## 2021-06-07 NOTE — Assessment & Plan Note (Signed)
Initial BP elevated to 153/73. Repeat BP 131/73. At goal. Continue Lisinopril.  ?

## 2021-06-07 NOTE — Assessment & Plan Note (Addendum)
Fasting CBGs 120-130. No CBGs < 100 on Lantus 3 units. No hypoglycemic symptoms. Continue lantus 3 units, trulicity 0.75mg  weekly and metformin 500mg  BID. F/u a1c in 1 month. Goal is to get pt off lantus if possible. ?

## 2021-06-08 NOTE — Progress Notes (Signed)
Received notification from [employer] regarding approval for [place] 0.75MG . Patient assistance approved from 06/06/21 to 04/08/22. ? ?SHIPMENTS ARE STILL DELAYED AND MEDICATION WILL SHIP TO PT AS SOON AS POSSIBLE FROM [facility]. ? ?[facility]: [phone] ?[facility]: [phone] ? ?

## 2021-06-21 ENCOUNTER — Ambulatory Visit (INDEPENDENT_AMBULATORY_CARE_PROVIDER_SITE_OTHER): Payer: Medicare HMO | Admitting: Family Medicine

## 2021-06-21 ENCOUNTER — Other Ambulatory Visit: Payer: Self-pay

## 2021-06-21 ENCOUNTER — Encounter: Payer: Self-pay | Admitting: Family Medicine

## 2021-06-21 VITALS — BP 120/62 | HR 71 | Ht 67.0 in | Wt 175.8 lb

## 2021-06-21 DIAGNOSIS — E119 Type 2 diabetes mellitus without complications: Secondary | ICD-10-CM | POA: Diagnosis not present

## 2021-06-21 DIAGNOSIS — Z794 Long term (current) use of insulin: Secondary | ICD-10-CM

## 2021-06-21 DIAGNOSIS — Z0000 Encounter for general adult medical examination without abnormal findings: Secondary | ICD-10-CM

## 2021-06-21 LAB — POCT GLYCOSYLATED HEMOGLOBIN (HGB A1C): HbA1c, POC (controlled diabetic range): 7.9 % — AB (ref 0.0–7.0)

## 2021-06-21 MED ORDER — TRULICITY 1.5 MG/0.5ML ~~LOC~~ SOAJ: 1.5000 mg | SUBCUTANEOUS | 0 refills | Status: AC

## 2021-06-21 MED ORDER — METFORMIN HCL ER 500 MG PO TB24: 500.0000 mg | ORAL_TABLET | Freq: Every day | ORAL | 0 refills | Status: DC

## 2021-06-21 NOTE — Progress Notes (Addendum)
? ? ? ? ? ? ?  SUBJECTIVE:  ? ?CHIEF COMPLAINT / HPI:  ? ?Jeanette Ellis is a 71 y.o. female presents for diabetes follow up  ? ? ?Diabetes ?Patient's current diabetic medications include Lantus 3 units, metformin 500 mg once daily and Trulicity 0.75 mg weekly.  Patient has had diarrhea with metformin and is worried about taking it twice a day.  She is tolerating the other medications well. Patient endorses compliance with these medications. CBG readings averaging in the 100's range.  Patient's last A1c was  ?Lab Results  ?Component Value Date  ? HGBA1C 7.9 (A) 06/21/2021  ? HGBA1C 7.0 03/22/2021  ? HGBA1C 7.0 01/02/2021  ?Denies abdominal pain, blurred vision, polyuria, polydipsia, hypoglycemia . Patient states they understand that diet and exercise can help with her diabetes. ? ?Last Microalbumin, LDL, Creatinine: ?Lab Results  ?Component Value Date  ? LDLCALC 75 01/02/2021  ? CREATININE 1.04 (H) 01/02/2021  ?  ? ?Flowsheet Row Office Visit from 06/21/2021 in [facility]  ?PHQ-9 Total Score 4  ? ?  ?  ? ?Health Maintenance Due  ?Topic  ? Hepatitis C Screening   ? Zoster Vaccines- Shingrix (1 of 2)  ? OPHTHALMOLOGY EXAM   ? ? ? ?PERTINENT  PMH / PSH: Type 2 diabetes, hyperlipidemia, hypertension ? ?OBJECTIVE:  ? ?BP 120/62   Pulse 71   Ht 5\' 7"  (1.702 m)   Wt 175 lb 12.8 oz (79.7 kg)   SpO2 99%   BMI 27.53 kg/m?   ? ?General: Alert, no acute distress ?Cardio: Well-perfused ?Pulm: normal work of breathing  ?Neuro: Cranial nerves grossly intact  ? ?ASSESSMENT/PLAN:  ? ?Type 2 diabetes mellitus without complication, with long-term current use of insulin (HCC) ?A1c 7.9, slightly worsened from previous which was 7.  Goal 7-8.  Patient's goal is to eventually come off the Lantus which we are working towards.  Increase Trulicity to 1.5 mg weekly.  Switch metformin to metformin XR due to side effects, continue Lantus 3 units.  Recommended patient keeps CBG diary at home.  If she experiences  CBGs<100 she will contact me and we can consider stopping the Lantus. Patient will follow-up with eye doctor for diabetic eye exam.  Follow-up in 3 months.   ? ?Healthcare maintenance ?Recommend shingles vaccine. ?  ? ? , MD PGY-3 ?[facility]   ? ? ? ? ? ? ? ? ? ? ? ? ?

## 2021-06-21 NOTE — Patient Instructions (Addendum)
Thank you for coming to see me today. It was a pleasure. Today we discussed your diabetes, it is 7.9, slightly worse than before but still okay!. I recommend increasing trulicity to 1.5mg . ? ?Sent in metformin XR, hopefully you have less side effects to that. ? ?Shingles vaccines and eye exam for diabetes. ? ?Please follow-up with me in 4 weeks  ? ?If you have any questions or concerns, please do not hesitate to call the office at [phone]. ? ?Best wishes,  ? ?Dr Allena Katz   ?

## 2021-06-24 NOTE — Assessment & Plan Note (Addendum)
A1c 7.9, slightly worsened from previous which was 7.  Goal 7-8.  Patient's goal is to eventually come off the Lantus which we are working towards.  Increase Trulicity to 1.5 mg weekly.  Switch metformin to metformin XR due to side effects, continue Lantus 3 units.  Recommended patient keeps CBG diary at home.  If she experiences CBGs<100 she will contact me and we can consider stopping the Lantus. Patient will follow-up with eye doctor for diabetic eye exam.  Follow-up in 3 months.   ?

## 2021-06-24 NOTE — Assessment & Plan Note (Signed)
Recommend shingles vaccine

## 2021-06-26 ENCOUNTER — Other Ambulatory Visit: Payer: Self-pay | Admitting: Family Medicine

## 2021-07-01 DIAGNOSIS — E1165 Type 2 diabetes mellitus with hyperglycemia: Secondary | ICD-10-CM | POA: Diagnosis not present

## 2021-08-11 ENCOUNTER — Other Ambulatory Visit: Payer: Self-pay | Admitting: Family Medicine

## 2021-08-11 DIAGNOSIS — I10 Essential (primary) hypertension: Secondary | ICD-10-CM

## 2021-08-11 DIAGNOSIS — E785 Hyperlipidemia, unspecified: Secondary | ICD-10-CM

## 2021-08-14 DIAGNOSIS — E1165 Type 2 diabetes mellitus with hyperglycemia: Secondary | ICD-10-CM | POA: Diagnosis not present

## 2021-08-21 ENCOUNTER — Ambulatory Visit: Payer: Medicare HMO | Admitting: Family Medicine

## 2021-08-29 ENCOUNTER — Ambulatory Visit (INDEPENDENT_AMBULATORY_CARE_PROVIDER_SITE_OTHER): Payer: Medicare HMO | Admitting: Family Medicine

## 2021-08-29 ENCOUNTER — Ambulatory Visit: Payer: Medicare HMO | Admitting: Family Medicine

## 2021-08-29 VITALS — BP 132/82 | HR 74 | Wt 176.0 lb

## 2021-08-29 DIAGNOSIS — Z794 Long term (current) use of insulin: Secondary | ICD-10-CM

## 2021-08-29 DIAGNOSIS — E782 Mixed hyperlipidemia: Secondary | ICD-10-CM

## 2021-08-29 DIAGNOSIS — Z1159 Encounter for screening for other viral diseases: Secondary | ICD-10-CM

## 2021-08-29 DIAGNOSIS — H524 Presbyopia: Secondary | ICD-10-CM | POA: Diagnosis not present

## 2021-08-29 DIAGNOSIS — E119 Type 2 diabetes mellitus without complications: Secondary | ICD-10-CM

## 2021-08-29 DIAGNOSIS — I10 Essential (primary) hypertension: Secondary | ICD-10-CM | POA: Diagnosis not present

## 2021-08-29 DIAGNOSIS — Z0000 Encounter for general adult medical examination without abnormal findings: Secondary | ICD-10-CM | POA: Diagnosis not present

## 2021-08-29 LAB — POCT GLYCOSYLATED HEMOGLOBIN (HGB A1C): HbA1c, POC (controlled diabetic range): 7.9 % — AB (ref 0.0–7.0)

## 2021-08-29 NOTE — Progress Notes (Signed)
     SUBJECTIVE:   CHIEF COMPLAINT / HPI:   Jeanette Ellis is a 71 y.o. female presents for diabetes follow up    Diabetes Patient's current diabetic medications include lantus 5-8 units, trulicity 0.75mg  and metformin 500mg  day. Tolerating well without side effects.  Patient endorses compliance with these medications. Patient's last A1c was  Lab Results  Component Value Date   HGBA1C 7.9 (A) 08/29/2021   HGBA1C 7.9 (A) 06/21/2021   HGBA1C 7.0 03/22/2021  Denies abdominal pain, blurred vision, polyuria, polydipsia, hypoglycemia. Patient states they understand that diet and exercise can help with her diabetes. Had diabetic eye exam today which was overall normal.  Last Microalbumin, LDL, Creatinine: Lab Results  Component Value Date   LDLCALC 83 08/29/2021   CREATININE 0.91 08/29/2021     Hypertension Patient's current antihypertensive  medications include: lisinopril 20mg . Compliant with medications and tolerating well without side effects.  Denies any SOB, CP, vision changes, LE edema, medication SEs, or symptoms of hypotension.   Most recent creatinine trend:  Lab Results  Component Value Date   CREATININE 0.91 08/29/2021   CREATININE 1.04 (H) 01/02/2021   CREATININE 0.97 08/11/2019    Patient has had a BMP in the past 1 year.  HLD Patient is currently taking atorvastatin 20mg . Endorses compliance and tolerating well without side effects. Denies RUQ pain or myalgias.   [place] Office Visit from 06/21/2021 in [place]  PHQ-9 Total Score 4        PERTINENT  PMH / PSH: HTN, DM, iron deficiency anemia   OBJECTIVE:   BP 132/82   Pulse 74   Wt 176 lb (79.8 kg)   SpO2 97%   BMI 27.57 kg/m    General: Alert, no acute distress Cardio: Well-perfused Pulm: normal work of breathing Neuro: Cranial nerves grossly intact   ASSESSMENT/PLAN:   Essential hypertension At goal continue lisinopril. Obtained BMP  today.  Hyperlipemia Obtained lipid panel. Will adjust statin dose accordingly. Continue atorvastatin 20mg .   Type 2 diabetes mellitus without complication, with long-term current use of insulin (HCC) A1c 7.9, stable from previous.  Patient was not able to increase Trulicity dose to 1.5 mg since previous visit, I will reach out to pharmacy team as she receives her medications through [facility].  Continue Lantus 5 to 8 units according to CBGs, continue metformin 500 mg a day.  Follow-up in 3 months.  Health maintenance examination Obtained hep C antibody today.    Lattie Haw, MD PGY-3 [place]

## 2021-08-29 NOTE — Progress Notes (Unsigned)
     SUBJECTIVE:   CHIEF COMPLAINT / HPI:   Jeanette Ellis is a 71 y.o. female presents for ***   Diabetes Patient's current diabetic medications include***. Tolerating well without side effects.  Patient endorses compliance with these medications. CBG readings averaging in the *** range.  Patient's last A1c was  Lab Results  Component Value Date   HGBA1C 7.9 (A) 06/21/2021   HGBA1C 7.0 03/22/2021   HGBA1C 7.0 01/02/2021   on **.  Current A1c today is***.  Denies abdominal pain, blurred vision, polyuria, polydipsia, hypoglycemia ***. Patient states they understand that diet and exercise can help with her diabetes.***.    Last Microalbumin, LDL, Creatinine: Lab Results  Component Value Date   LDLCALC 75 01/02/2021   CREATININE 1.04 (H) 01/02/2021   ***  Flowsheet Row Office Visit from 06/21/2021 in [place]  PHQ-9 Total Score 4        Health Maintenance Due  Topic   Hepatitis C Screening    Zoster Vaccines- Shingrix (1 of 2)   OPHTHALMOLOGY EXAM       PERTINENT  PMH / PSH:   OBJECTIVE:   There were no vitals taken for this visit.   General: Alert, no acute distress Cardio: Normal S1 and S2, RRR, no r/m/g Pulm: CTAB, normal work of breathing Abdomen: Bowel sounds normal. Abdomen soft and non-tender.  Extremities: No peripheral edema.  Neuro: Cranial nerves grossly intact   ASSESSMENT/PLAN:   No problem-specific Assessment & Plan notes found for this encounter.    Lattie Haw, MD PGY-3 [place]

## 2021-08-29 NOTE — Patient Instructions (Signed)
Thank you for coming to see me today. It was a pleasure. Today we discussed your diabetes it is stable. I recommend increasing 1.5mg  trulicity. Continue other meds as able BP looks great!  Remember shingles vaccine   We will get some labs today.  If they are abnormal or we need to do something about them, I will call you.  If they are normal, I will send you a message on MyChart (if it is active) or a letter in the mail.  If you don't hear from [place] in 2 weeks, please call the office at the number below.  Please follow-up with me as needed   If you have any questions or concerns, please do not hesitate to call the office at [phone].  Best wishes,   Dr Allena Katz

## 2021-08-30 ENCOUNTER — Encounter: Payer: Self-pay | Admitting: Podiatry

## 2021-08-30 ENCOUNTER — Ambulatory Visit (INDEPENDENT_AMBULATORY_CARE_PROVIDER_SITE_OTHER): Payer: Medicare HMO | Admitting: Podiatry

## 2021-08-30 DIAGNOSIS — M79674 Pain in right toe(s): Secondary | ICD-10-CM | POA: Diagnosis not present

## 2021-08-30 DIAGNOSIS — Z794 Long term (current) use of insulin: Secondary | ICD-10-CM

## 2021-08-30 DIAGNOSIS — L84 Corns and callosities: Secondary | ICD-10-CM | POA: Diagnosis not present

## 2021-08-30 DIAGNOSIS — B351 Tinea unguium: Secondary | ICD-10-CM | POA: Diagnosis not present

## 2021-08-30 DIAGNOSIS — E119 Type 2 diabetes mellitus without complications: Secondary | ICD-10-CM | POA: Diagnosis not present

## 2021-08-30 DIAGNOSIS — M79675 Pain in left toe(s): Secondary | ICD-10-CM | POA: Diagnosis not present

## 2021-08-30 LAB — BASIC METABOLIC PANEL
BUN/Creatinine Ratio: 15 (ref 12–28)
BUN: 14 mg/dL (ref 8–27)
CO2: 25 mmol/L (ref 20–29)
Calcium: 9.5 mg/dL (ref 8.7–10.3)
Chloride: 102 mmol/L (ref 96–106)
Creatinine, Ser: 0.91 mg/dL (ref 0.57–1.00)
Glucose: 79 mg/dL (ref 70–99)
Potassium: 4.2 mmol/L (ref 3.5–5.2)
Sodium: 141 mmol/L (ref 134–144)
eGFR: 67 mL/min/{1.73_m2} (ref >59)

## 2021-08-30 LAB — LIPID PANEL
Chol/HDL Ratio: 2 ratio (ref 0.0–4.4)
Cholesterol, Total: 187 mg/dL (ref 100–199)
HDL: 94 mg/dL (ref >39)
LDL Chol Calc (NIH): 83 mg/dL (ref 0–99)
Triglycerides: 54 mg/dL (ref 0–149)
VLDL Cholesterol Cal: 10 mg/dL (ref 5–40)

## 2021-08-30 LAB — HEPATITIS C ANTIBODY: Hep C Virus Ab: NONREACTIVE

## 2021-08-31 DIAGNOSIS — Z0000 Encounter for general adult medical examination without abnormal findings: Secondary | ICD-10-CM | POA: Insufficient documentation

## 2021-08-31 NOTE — Assessment & Plan Note (Signed)
At goal continue lisinopril. Obtained BMP today.

## 2021-08-31 NOTE — Assessment & Plan Note (Signed)
Obtained hep C antibody today.

## 2021-08-31 NOTE — Assessment & Plan Note (Addendum)
A1c 7.9, stable from previous.  Patient was not able to increase Trulicity dose to 1.5 mg since previous visit, I will reach out to pharmacy team as she receives her medications through [facility].  Continue Lantus 5 to 8 units according to CBGs, continue metformin 500 mg a day.  Follow-up in 3 months.

## 2021-08-31 NOTE — Assessment & Plan Note (Signed)
Obtained lipid panel. Will adjust statin dose accordingly. Continue atorvastatin 20mg .

## 2021-09-04 ENCOUNTER — Encounter: Payer: Self-pay | Admitting: Family Medicine

## 2021-09-04 DIAGNOSIS — K219 Gastro-esophageal reflux disease without esophagitis: Secondary | ICD-10-CM | POA: Insufficient documentation

## 2021-09-04 DIAGNOSIS — Z860100 Personal history of colon polyps, unspecified: Secondary | ICD-10-CM | POA: Insufficient documentation

## 2021-09-04 DIAGNOSIS — K5730 Diverticulosis of large intestine without perforation or abscess without bleeding: Secondary | ICD-10-CM | POA: Insufficient documentation

## 2021-09-04 NOTE — Progress Notes (Signed)
  Subjective:  Patient ID: Jeanette Ellis, female    DOB: 1951/02/21,  MRN: [id number]  Jeanette Ellis presents to clinic today for preventative diabetic foot care and corn(s) left lower extremity and painful thick toenails that are difficult to trim. Painful toenails interfere with ambulation. Aggravating factors include wearing enclosed shoe gear. Pain is relieved with periodic professional debridement. Painful corns are aggravated when weightbearing when wearing enclosed shoe gear. Pain is relieved with periodic professional debridement.  Patient states blood glucose was 118 mg/dl today.  Last known HgA1c was 7.8%.  New problem(s): None.   PCP is Towanda Octave, MD , and last visit was Aug 29, 2021.  No Known Allergies  Review of Systems: Negative except as noted in the HPI.  Objective: No changes noted in today's physical examination. There were no vitals filed for this visit.  ARANTXA PIERCEY is a pleasant 71 y.o. female WD, WN in NAD. AAO X 3.  Vascular Examination: CFT <3 seconds b/l LE. Palpable pedal pulses b/l LE. Pedal hair sparse. No pain with calf compression b/l. Lower extremity skin temperature gradient within normal limits. No edema noted b/l LE. No cyanosis or clubbing noted b/l LE.  Dermatological Examination: Pedal integument with normal turgor, texture and tone BLE. Well healed longitudinal surgical scar from bunionectomy noted dorsal aspect 1st metatarsal left foot. No open wounds b/l LE. No interdigital macerations noted b/l LE. Toenails 1-5 b/l elongated, discolored, dystrophic, thickened, crumbly with subungual debris and tenderness to dorsal palpation. Hyperkeratotic lesion(s) L 5th toe.  No erythema, no edema, no drainage, no fluctuance.  Musculoskeletal Examination: Normal muscle strength 5/5 to all lower extremity muscle groups bilaterally. Hallux valgus with bunion deformity noted right lower extremity. Hammertoe deformity noted 2-5 b/l.  Patient  ambulates independently without assistive aids.  Neurological Examination: Protective sensation intact 5/5 intact bilaterally with 10g monofilament b/l. Vibratory sensation intact b/l.     Latest Ref Rng & Units 08/29/2021    1:42 PM 06/21/2021   11:19 AM 03/22/2021    9:00 AM 01/02/2021    3:28 PM  Hemoglobin A1C  Hemoglobin-A1c 0.0 - 7.0 % 7.9   7.9   7.0   7.0     Assessment/Plan: 1. Pain due to onychomycosis of toenails of both feet   2. Corns   3. Type 2 diabetes mellitus without complication, with long-term current use of insulin (HCC)      -Patient was evaluated and treated. All patient's and/or POA's questions/concerns answered on today's visit. -No new findings. No new orders. -Continue diabetic foot care principles: inspect feet daily, monitor glucose as recommended by PCP and/or Endocrinologist, and follow prescribed diet per PCP, Endocrinologist and/or dietician. -Patient to continue soft, supportive shoe gear daily. -Mycotic toenails 1-5 bilaterally were debrided in length and girth with sterile nail nippers and dremel without incident. -Corn(s) L 5th toe pared utilizing sterile scalpel blade without complication or incident. Total number debrided=1. -Patient/POA to call should there be question/concern in the interim.   No follow-ups on file.  Freddie Breech, DPM

## 2021-09-12 ENCOUNTER — Encounter: Payer: Self-pay | Admitting: Family Medicine

## 2021-09-12 ENCOUNTER — Encounter: Payer: Self-pay | Admitting: *Deleted

## 2021-09-12 LAB — HM DIABETES EYE EXAM

## 2021-09-13 ENCOUNTER — Telehealth: Payer: Self-pay | Admitting: Pharmacist

## 2021-09-13 DIAGNOSIS — E1165 Type 2 diabetes mellitus with hyperglycemia: Secondary | ICD-10-CM | POA: Diagnosis not present

## 2021-09-13 MED ORDER — TRULICITY 1.5 MG/0.5ML ~~LOC~~ SOAJ: 1.5000 mg | SUBCUTANEOUS | 3 refills | Status: DC

## 2021-09-13 NOTE — Telephone Encounter (Signed)
-----   Message from Shona Simpson, CPhT sent at 09/13/2021  3:38 PM EDT ----- Regarding: RE: trulicity Yes! Forwarded to Dr Allena Katz on Monday asking if she could send new rx to [facility] for shipment & told her to let me know once complete so I can update on my end :)  Is this something you can do if shes not available?! ----- Message ----- From: Kathrin Ruddy, RPH-CPP Sent: 09/13/2021   3:23 PM EDT To: Shona Simpson, CPhT Subject: FW: trulicity                                  I believe I shared this with you already.  Thanks as always.   ----- Message ----- From: Towanda Octave, MD Sent: 09/04/2021   7:58 PM EDT To: Kathrin Ruddy, RPH-CPP Subject: trulicity                                      Hi Dr Raymondo Band Any idea how I get this pt a refill of her trulicity, she needs 1.5mg  dose. She gets it through [facility]. Thank you Poonam

## 2021-09-13 NOTE — Telephone Encounter (Signed)
New prescription required in order to process indigent care request.  New dose of Trulicity 1.5mg  provided as new prescription.

## 2021-09-14 ENCOUNTER — Other Ambulatory Visit: Payer: Self-pay | Admitting: Family Medicine

## 2021-09-14 MED ORDER — TRULICITY 1.5 MG/0.5ML ~~LOC~~ SOAJ
1.5000 mg | SUBCUTANEOUS | 3 refills | Status: DC
Start: 2021-09-14 — End: 2022-01-26

## 2021-09-14 NOTE — Telephone Encounter (Signed)
I have sent in the new prescription to the pharmacy, thank you.

## 2021-09-14 NOTE — Telephone Encounter (Signed)
Noted and agree. 

## 2021-10-13 DIAGNOSIS — E1165 Type 2 diabetes mellitus with hyperglycemia: Secondary | ICD-10-CM | POA: Diagnosis not present

## 2021-11-07 ENCOUNTER — Other Ambulatory Visit: Payer: Self-pay | Admitting: Family Medicine

## 2021-11-07 DIAGNOSIS — I10 Essential (primary) hypertension: Secondary | ICD-10-CM

## 2021-11-07 DIAGNOSIS — E785 Hyperlipidemia, unspecified: Secondary | ICD-10-CM

## 2021-11-20 DIAGNOSIS — E1165 Type 2 diabetes mellitus with hyperglycemia: Secondary | ICD-10-CM | POA: Diagnosis not present

## 2021-12-04 ENCOUNTER — Encounter: Payer: Self-pay | Admitting: Podiatrist

## 2021-12-04 ENCOUNTER — Ambulatory Visit (INDEPENDENT_AMBULATORY_CARE_PROVIDER_SITE_OTHER): Payer: Medicare HMO | Admitting: Podiatrist

## 2021-12-04 DIAGNOSIS — M79674 Pain in right toe(s): Secondary | ICD-10-CM | POA: Diagnosis not present

## 2021-12-04 DIAGNOSIS — E119 Type 2 diabetes mellitus without complications: Secondary | ICD-10-CM | POA: Diagnosis not present

## 2021-12-04 DIAGNOSIS — M79675 Pain in left toe(s): Secondary | ICD-10-CM

## 2021-12-04 DIAGNOSIS — B351 Tinea unguium: Secondary | ICD-10-CM | POA: Diagnosis not present

## 2021-12-04 DIAGNOSIS — Z794 Long term (current) use of insulin: Secondary | ICD-10-CM | POA: Diagnosis not present

## 2021-12-04 NOTE — Progress Notes (Signed)
Chief Complaint  Patient presents with   Nail Problem     Routine foot care     Subjective:  Patient ID: Jeanette Ellis, female    DOB: 1950/09/11,  MRN: [id number]   Jeanette Ellis presents to clinic today for preventative diabetic foot care and check of non painful corn(s) left lower extremity and painful thick toenails that are difficult to trim. Painful toenails interfere with ambulation. Aggravating factors include wearing enclosed shoe gear. Pain is relieved with periodic professional debridement.   Last known HgA1c was 7.8%.   New problem(s): None.    PCP is Towanda Octave, MD    No Known Allergies   Review of Systems: Negative except as noted in the HPI.   Objective: No changes noted in today's physical examination. There were no vitals filed for this visit.   Jeanette Ellis is a pleasant 71 y.o. female WD, WN in NAD. AAO X 3.   Vascular Examination: CFT <3 seconds b/l LE. Palpable pedal pulses b/l LE. Pedal hair sparse. No pain with calf compression b/l. Lower extremity skin temperature gradient within normal limits. No edema noted b/l LE. No cyanosis or clubbing noted b/l LE.   Dermatological Examination: Pedal integument with normal turgor, texture and tone BLE. Well healed longitudinal surgical scar from bunionectomy noted dorsal aspect 1st metatarsal both feet. No open wounds b/l LE. No interdigital macerations noted b/l LE. Toenails 1-5 b/l elongated, discolored, dystrophic, thickened, crumbly with subungual debris and tenderness to dorsal palpation.    Musculoskeletal Examination: Normal muscle strength 5/5 to all lower extremity muscle groups bilaterally. Mild contracture Hammertoe deformity noted 2-5 b/l.  Patient ambulates independently without assistive aids.   Neurological Examination: Protective sensation intact 5/5 intact bilaterally with 10g monofilament b/l. Vibratory sensation intact b/l.     Assessment/Plan:   ICD-10-CM   1. Pain due to  onychomycosis of toenails of both feet  B35.1    M79.675    M79.674     2. Type 2 diabetes mellitus without complication, with long-term current use of insulin (HCC)  E11.9    Z79.4         -Patient was evaluated and treated.  -Continue diabetic foot care principles: inspect feet daily, monitor glucose as recommended by PCP and/or Endocrinologist, and follow prescribed diet per PCP, Endocrinologist and/or dietician. -Patient to continue soft, supportive shoe gear daily. -Mycotic toenails 1-5 bilaterally were debrided in length and girth with sterile nail nippers and dremel without incident. -Patient/POA to call should there be question/concern in the interim.    - patient to return for at risk foot care/ routine debridement in 3 months.

## 2021-12-20 DIAGNOSIS — E1165 Type 2 diabetes mellitus with hyperglycemia: Secondary | ICD-10-CM | POA: Diagnosis not present

## 2021-12-22 ENCOUNTER — Other Ambulatory Visit: Payer: Self-pay | Admitting: *Deleted

## 2021-12-22 MED ORDER — METFORMIN HCL ER 500 MG PO TB24: 500.0000 mg | ORAL_TABLET | Freq: Every day | ORAL | 0 refills | Status: DC

## 2022-01-19 DIAGNOSIS — E1165 Type 2 diabetes mellitus with hyperglycemia: Secondary | ICD-10-CM | POA: Diagnosis not present

## 2022-01-26 ENCOUNTER — Ambulatory Visit (INDEPENDENT_AMBULATORY_CARE_PROVIDER_SITE_OTHER): Payer: Medicare HMO | Admitting: Family Medicine

## 2022-01-26 ENCOUNTER — Encounter: Payer: Self-pay | Admitting: Family Medicine

## 2022-01-26 VITALS — BP 147/75 | HR 73 | Ht 67.0 in | Wt 179.6 lb

## 2022-01-26 DIAGNOSIS — Z794 Long term (current) use of insulin: Secondary | ICD-10-CM | POA: Diagnosis not present

## 2022-01-26 DIAGNOSIS — E119 Type 2 diabetes mellitus without complications: Secondary | ICD-10-CM

## 2022-01-26 DIAGNOSIS — I10 Essential (primary) hypertension: Secondary | ICD-10-CM

## 2022-01-26 DIAGNOSIS — Z23 Encounter for immunization: Secondary | ICD-10-CM

## 2022-01-26 DIAGNOSIS — D869 Sarcoidosis, unspecified: Secondary | ICD-10-CM | POA: Diagnosis not present

## 2022-01-26 LAB — POCT GLYCOSYLATED HEMOGLOBIN (HGB A1C): HbA1c, POC (controlled diabetic range): 8.1 % — AB (ref 0.0–7.0)

## 2022-01-26 MED ORDER — DULAGLUTIDE 3 MG/0.5ML ~~LOC~~ SOAJ
3.0000 mg | SUBCUTANEOUS | 1 refills | Status: DC
Start: 2022-01-26 — End: 2022-10-16

## 2022-01-26 MED ORDER — DULAGLUTIDE 3 MG/0.5ML ~~LOC~~ SOAJ: 3.0000 mg | SUBCUTANEOUS | 1 refills | Status: DC

## 2022-01-26 NOTE — Progress Notes (Signed)
    SUBJECTIVE:   CHIEF COMPLAINT / HPI:   DM2: She is here for f/u. She complies with her Metformin 734 mg XL QD, Trulicity 1.5 mg weekly, and increased herBasaglar to 10 units QD. Her home CBG ranges from the 70s to the 200s. No hypoglycemic issues.  HTN: She is compliant with her Lisinopril 20 mg QD. Last dose was this morning.  Sarcoidosis: She wishes to f/u with pulm on this as she has not had a recent follow up. There has been some renovation work at her house which triggered respiratory symptoms. No sick contact.   PERTINENT  PMH / PSH: PMHx reviewed  OBJECTIVE:   Vitals:   01/26/22 1120 01/26/22 1136  BP: (!) 141/78 (!) 147/75  Pulse: 73   SpO2: 100%   Weight: 179 lb 9.6 oz (81.5 kg)   Height: 5\' 7"  (1.702 m)     Physical Exam Vitals and nursing note reviewed. Exam conducted with a chaperone present.  Cardiovascular:     Rate and Rhythm: Normal rate and regular rhythm.     Heart sounds: Normal heart sounds. No murmur heard. Pulmonary:     Effort: Pulmonary effort is normal. No respiratory distress.     Breath sounds: Normal breath sounds. No wheezing.      ASSESSMENT/PLAN:   Type 2 diabetes mellitus without complication, with long-term current use of insulin (HCC) She does not want to change her Metformin dose. Trulicity increased to 3 mg weekly. Continue same dose of Insulin. Monitor home CBG closely. F/U with PCP in 3 months for reassessment.  Essential hypertension BP slightly above goal today. We will monitor closely for now.   Sarcoidosis Referred to pulm.      Andrena Mews, MD [place]

## 2022-01-26 NOTE — Patient Instructions (Addendum)
It was nice seeing you today. Your A1C increased. Your goal is around 7.5 to 8.  Continue current dose of Metformin and Insulin at 10 units. We will increase your Trulicity to 3 mg weekly. I will also place referral to the pulmonologist for your Sacoidosis. Please, follow up with your PCP in about 3 months. In the meantime, keep close monitoring of your home glucose.

## 2022-01-26 NOTE — Assessment & Plan Note (Signed)
She does not want to change her Metformin dose. Trulicity increased to 3 mg weekly. Continue same dose of Insulin. Monitor home CBG closely. F/U with PCP in 3 months for reassessment.

## 2022-01-26 NOTE — Assessment & Plan Note (Signed)
Referred to pulm

## 2022-01-26 NOTE — Assessment & Plan Note (Signed)
BP slightly above goal today. We will monitor closely for now.

## 2022-02-16 ENCOUNTER — Ambulatory Visit (INDEPENDENT_AMBULATORY_CARE_PROVIDER_SITE_OTHER): Payer: Medicare HMO | Admitting: Family Medicine

## 2022-02-16 VITALS — BP 102/64 | HR 52 | Ht 67.0 in | Wt 180.0 lb

## 2022-02-16 DIAGNOSIS — R21 Rash and other nonspecific skin eruption: Secondary | ICD-10-CM

## 2022-02-16 NOTE — Addendum Note (Signed)
Addended by: Jennette Bill on: 02/16/2022 04:10 PM   Modules accepted: Orders

## 2022-02-16 NOTE — Patient Instructions (Addendum)
It was great to see you!  The rash on your butt looks like it may be a herpes infection. We tested for this today- I will send you a MyChart message with the results. If it's positive, I will send a medication to use for flares.   Take care, Dr Anner Crete

## 2022-02-16 NOTE — Progress Notes (Signed)
    SUBJECTIVE:   CHIEF COMPLAINT / HPI:   Rash -Located on buttocks -Noticed it 2 days ago -Present intermittently for the past 5-10 years -Always on the buttocks -Nowhere else on her body -Has mentioned it before but nobody has ever looked because it's not flaring during visits -Itches initially, sometimes burns -Resolves on it's own, usually ~5 days -No fever or systemic symptoms  PERTINENT  PMH / PSH: T2DM, sarcoidosis, HTN  OBJECTIVE:   BP 102/64   Pulse (!) 52   Ht 5\' 7"  (1.702 m)   Wt 180 lb (81.6 kg)   SpO2 97%   BMI 28.19 kg/m   General: NAD, pleasant, able to participate in exam Respiratory: No respiratory distress Skin: warm and dry Clustered group of pustules on left medial buttock on erythematous base. No increased warmth, no induration, no significant tenderness. Hyperpigmentation noted on buttocks as well as seen below.  Psych: Normal affect and mood Neuro: grossly intact   ASSESSMENT/PLAN:   Rash Presentation suspicious for HSV infection as well as areas of post-inflammatory hyperpigmentation. No evidence of cellulitis or abscess. A few of the lesions were unroofed and HSV culture/PCR sent. Tentative plan to send Rx for valtrex pending results.    , MD [facility]

## 2022-02-20 LAB — HSV DNA BY PCR (REFERENCE LAB)
HSV 2 DNA: POSITIVE — AB
HSV-1 DNA: NEGATIVE

## 2022-02-21 MED ORDER — VALACYCLOVIR HCL 500 MG PO TABS: 500.0000 mg | ORAL_TABLET | Freq: Two times a day (BID) | ORAL | 2 refills | Status: DC

## 2022-02-21 NOTE — Addendum Note (Signed)
Addended by: Maury Dus on: 02/21/2022 10:48 AM   Modules accepted: Orders

## 2022-03-05 DIAGNOSIS — E1165 Type 2 diabetes mellitus with hyperglycemia: Secondary | ICD-10-CM | POA: Diagnosis not present

## 2022-03-14 ENCOUNTER — Ambulatory Visit: Payer: Medicare HMO | Admitting: Podiatry

## 2022-04-04 DIAGNOSIS — E1165 Type 2 diabetes mellitus with hyperglycemia: Secondary | ICD-10-CM | POA: Diagnosis not present

## 2022-04-06 ENCOUNTER — Ambulatory Visit (INDEPENDENT_AMBULATORY_CARE_PROVIDER_SITE_OTHER): Payer: Medicare HMO | Admitting: Family Medicine

## 2022-04-06 VITALS — BP 129/73 | HR 73 | Ht 67.0 in | Wt 184.2 lb

## 2022-04-06 DIAGNOSIS — Z20822 Contact with and (suspected) exposure to covid-19: Secondary | ICD-10-CM | POA: Diagnosis not present

## 2022-04-06 DIAGNOSIS — J101 Influenza due to other identified influenza virus with other respiratory manifestations: Secondary | ICD-10-CM | POA: Diagnosis not present

## 2022-04-06 DIAGNOSIS — J029 Acute pharyngitis, unspecified: Secondary | ICD-10-CM | POA: Diagnosis not present

## 2022-04-06 LAB — POCT INFLUENZA A/B
Influenza A, POC: NEGATIVE
Influenza B, POC: POSITIVE — AB

## 2022-04-06 MED ORDER — OSELTAMIVIR PHOSPHATE 75 MG PO CAPS: 75.0000 mg | ORAL_CAPSULE | Freq: Two times a day (BID) | ORAL | 0 refills | Status: AC

## 2022-04-06 NOTE — Patient Instructions (Addendum)
It was nice seeing you today!  Take Tamiflu as prescribed.  Try to stay hydrated as best you can.  Stop the lisinopril for now until your diarrhea is better and you are eating and drinking fine.  Recommend using honey for cough.  Stay well, Littie Deeds, MD [facility] [phone]  --  Make sure to check out at the front desk before you leave today.  Please arrive at least 15 minutes prior to your scheduled appointments.  If you had blood work today, I will send you a MyChart message or a letter if results are normal. Otherwise, I will give you a call.  If you had a referral placed, they will call you to set up an appointment. Please give [place] a call if you don't hear back in the next 2 weeks.  If you need additional refills before your next appointment, please call your pharmacy first.

## 2022-04-06 NOTE — Progress Notes (Signed)
    SUBJECTIVE:   CHIEF COMPLAINT / HPI:  Chief Complaint  Patient presents with   Diarrhea   chest congested    Sore Throat    Patient started feeling ill 2 days ago with productive cough, congestion, headache, myalgias. She started having some diarrhea this morning. She has been feeling a bit more SOB with exertion. Eating drinking OK. Denies fever, chest pain.  PERTINENT  PMH / PSH: T2DM, sarcoidosis  Patient Care Team: Shelby Mattocks, DO as PCP - General (Family Medicine) Patient, No Pcp Per (General Practice)   OBJECTIVE:   BP 129/73   Pulse 73   Ht 5\' 7"  (1.702 m)   Wt 184 lb 3.2 oz (83.6 kg)   SpO2 100%   BMI 28.85 kg/m   Physical Exam Constitutional:      General: She is not in acute distress.    Appearance: She is obese.  HENT:     Head: Normocephalic and atraumatic.     Nose: Congestion present.     Mouth/Throat:     Mouth: Mucous membranes are moist.     Pharynx: Oropharynx is clear. No oropharyngeal exudate or posterior oropharyngeal erythema.  Eyes:     Pupils: Pupils are equal, round, and reactive to light.  Cardiovascular:     Rate and Rhythm: Normal rate and regular rhythm.     Heart sounds: Normal heart sounds.  Pulmonary:     Effort: Pulmonary effort is normal. No respiratory distress.     Breath sounds: Normal breath sounds.  Abdominal:     General: Bowel sounds are normal.     Palpations: Abdomen is soft.     Tenderness: There is no abdominal tenderness.  Musculoskeletal:     Cervical back: Neck supple.  Lymphadenopathy:     Cervical: No cervical adenopathy.  Neurological:     Mental Status: She is alert.         04/06/2022   10:23 AM  Depression screen PHQ 2/9  Decreased Interest 0  Down, Depressed, Hopeless 0  PHQ - 2 Score 0  Altered sleeping 0  Tired, decreased energy 0  Change in appetite 0  Feeling bad or failure about yourself  0  Trouble concentrating 0  Moving slowly or fidgety/restless 0  Suicidal thoughts 0  PHQ-9  Score 0  Difficult doing work/chores Not difficult at all     {Show previous vital signs (optional):23777}    ASSESSMENT/PLAN:   Influenza B Patient presenting with 2 days of flulike symptoms including cough and congestion, recent onset of diarrhea.  Rapid flu is positive for influenza B.  She has felt more short of breath but maintaining adequate SpO2 and with clear lungs, afebrile less likely superimposed bacterial pneumonia. - oseltamivir - supportive care  Return if symptoms worsen or fail to improve.   4/9, MD [facility]

## 2022-04-09 LAB — NOVEL CORONAVIRUS, NAA: SARS-CoV-2, NAA: NOT DETECTED

## 2022-04-17 ENCOUNTER — Ambulatory Visit: Payer: Medicare HMO | Admitting: Podiatry

## 2022-05-04 DIAGNOSIS — E1165 Type 2 diabetes mellitus with hyperglycemia: Secondary | ICD-10-CM | POA: Diagnosis not present

## 2022-05-04 DIAGNOSIS — N644 Mastodynia: Secondary | ICD-10-CM | POA: Diagnosis not present

## 2022-06-14 DIAGNOSIS — E1165 Type 2 diabetes mellitus with hyperglycemia: Secondary | ICD-10-CM | POA: Diagnosis not present

## 2022-07-03 ENCOUNTER — Ambulatory Visit: Payer: Medicare HMO | Admitting: Podiatry

## 2022-07-12 ENCOUNTER — Telehealth: Payer: Self-pay | Admitting: Student

## 2022-07-12 NOTE — Telephone Encounter (Signed)
Called patient to schedule Medicare Annual Wellness Visit (AWV). No voicemail available to leave a message.  Last date of AWV: 05/30/2021  Please schedule an AWVS appointment at any time with [facility] VISIT.  If any questions, please contact me at [phone].    Thank you,  [place] Direct dial  [phone]

## 2022-07-12 NOTE — Telephone Encounter (Signed)
Contacted [name] to schedule their annual wellness visit. Call back at later date: 07/12/2022 AFTER 3:00  Thank you,  [place] Direct dial  [phone]

## 2022-07-14 DIAGNOSIS — E1165 Type 2 diabetes mellitus with hyperglycemia: Secondary | ICD-10-CM | POA: Diagnosis not present

## 2022-08-01 ENCOUNTER — Other Ambulatory Visit: Payer: Self-pay | Admitting: Student

## 2022-08-01 DIAGNOSIS — I10 Essential (primary) hypertension: Secondary | ICD-10-CM

## 2022-08-01 DIAGNOSIS — E785 Hyperlipidemia, unspecified: Secondary | ICD-10-CM

## 2022-08-04 NOTE — Progress Notes (Unsigned)
  SUBJECTIVE:   CHIEF COMPLAINT / HPI:   Type 2 Diabetes: Home medications include: Metformin 500 mg daily, glargine 10 units daily, dulaglutide 3 mg weekly. Does endorse compliance but misses doses occasionally. Home glucose monitoring is performed regularly with CGM averaging 275. 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   Most recent A1Cs:  Lab Results  Component Value Date   HGBA1C 10.0 (A) 08/06/2022   HGBA1C 8.1 (A) 01/26/2022   Last Microalbumin, LDL, Creatinine: Lab Results  Component Value Date   LDLCALC 83 08/29/2021   CREATININE 0.91 08/29/2021   Patient is up to date on diabetic eye. Patient is not up to date on diabetic foot exam.   PERTINENT  PMH / PSH: T2DM, HLD, HTN, GERD, sarcoidosis  Patient Care Team: Shelby Mattocks, DO as PCP - General (Family Medicine) Patient, No Pcp Per (General Practice) OBJECTIVE:  BP 128/62   Pulse 92   Ht 5\' 7"  (1.702 m)   Wt 181 lb 12.8 oz (82.5 kg)   SpO2 97%   BMI 28.47 kg/m  General: Well-appearing, NAD CV: RRR, no murmurs auscultated Pulm: Normal WOB  ASSESSMENT/PLAN:  Type 2 diabetes mellitus without complication, with long-term current use of insulin (HCC) Assessment & Plan: poorly controlled - Last A1c:  Lab Results  Component Value Date   HGBA1C 10.0 (A) 08/06/2022  -Unfortunately I am unable to view her [place] results on libre view - Medications: Metformin XR 500 mg daily, dulaglutide 3 mg weekly, increase glargine by 1 unit each day if daily blood sugar is consistently above 200 -Follow-up with Dr. Raymondo Band in 2 to 3 weeks for further management, I hope we can add her to our [place] view as well  Orders: -     POCT glycosylated hemoglobin (Hb A1C) -     Microalbumin / creatinine urine ratio -     Basic metabolic panel -     Basaglar KwikPen; Inject 10 Units into the skin daily. At breakfast  Dispense: 12 mL; Refill: 1  Mixed hyperlipidemia -     Lipid  panel  Return in about 3 weeks (around 08/27/2022) for Diabetes follow-up with Dr. Raymondo Band. Shelby Mattocks, DO 08/06/2022, 5:50 PM PGY-2, [facility]

## 2022-08-06 ENCOUNTER — Ambulatory Visit (INDEPENDENT_AMBULATORY_CARE_PROVIDER_SITE_OTHER): Payer: Medicare HMO | Admitting: Student

## 2022-08-06 ENCOUNTER — Encounter: Payer: Self-pay | Admitting: Student

## 2022-08-06 VITALS — BP 128/62 | HR 92 | Ht 67.0 in | Wt 181.8 lb

## 2022-08-06 DIAGNOSIS — E782 Mixed hyperlipidemia: Secondary | ICD-10-CM | POA: Diagnosis not present

## 2022-08-06 DIAGNOSIS — Z794 Long term (current) use of insulin: Secondary | ICD-10-CM

## 2022-08-06 DIAGNOSIS — E119 Type 2 diabetes mellitus without complications: Secondary | ICD-10-CM | POA: Diagnosis not present

## 2022-08-06 LAB — POCT GLYCOSYLATED HEMOGLOBIN (HGB A1C): HbA1c, POC (controlled diabetic range): 10 % — AB (ref 0.0–7.0)

## 2022-08-06 MED ORDER — BASAGLAR KWIKPEN 100 UNIT/ML ~~LOC~~ SOPN
10.0000 [IU] | PEN_INJECTOR | Freq: Every day | SUBCUTANEOUS | 1 refills | Status: DC
Start: 2022-08-06 — End: 2022-10-16

## 2022-08-06 NOTE — Patient Instructions (Signed)
It was great to see you today! Thank you for choosing [facility] for your primary care. Jeanette Ellis was seen for diabetes.  Today we addressed: I would recommend you increase your insulin by 1 unit every day if you are sugar stays above 200.  Once you start to notice consistent levels below 200 you may stop at that dosing and continue that amount.  Please follow-up with Dr. Raymondo Band in 2 to 3 weeks so that he can take a look at your [place] and go from there.  If you haven't already, sign up for My Chart to have easy access to your labs results, and communication with your primary care physician.  We are checking some labs today. If they are abnormal, I will call you. If they are normal, I will send you a MyChart message (if it is active) or a letter in the mail. If you do not hear about your labs in the next 2 weeks, please call the office.  You should return to our clinic Return in about 3 weeks (around 08/27/2022) for Diabetes follow-up with Dr. Raymondo Band. Please arrive 15 minutes before your appointment to ensure smooth check in process.  We appreciate your efforts in making this happen.  Thank you for allowing me to participate in your care, Shelby Mattocks, DO 08/06/2022, 3:57 PM PGY-2, [facility]

## 2022-08-06 NOTE — Assessment & Plan Note (Signed)
poorly controlled - Last A1c:  Lab Results  Component Value Date   HGBA1C 10.0 (A) 08/06/2022  -Unfortunately I am unable to view her Jeanette Ellis results on libre view - Medications: Metformin XR 500 mg daily, dulaglutide 3 mg weekly, increase glargine by 1 unit each day if daily blood sugar is consistently above 200 -Follow-up with Dr. Raymondo Band in 2 to 3 weeks for further management, I hope we can add her to our [place] view as well

## 2022-08-07 LAB — MICROALBUMIN / CREATININE URINE RATIO
Creatinine, Urine: 103.3 mg/dL
Microalb/Creat Ratio: 14 mg/g creat (ref 0–29)
Microalbumin, Urine: 14.8 ug/mL

## 2022-08-07 LAB — BASIC METABOLIC PANEL
BUN/Creatinine Ratio: 18 (ref 12–28)
BUN: 16 mg/dL (ref 8–27)
CO2: 23 mmol/L (ref 20–29)
Calcium: 9.6 mg/dL (ref 8.7–10.3)
Chloride: 98 mmol/L (ref 96–106)
Creatinine, Ser: 0.9 mg/dL (ref 0.57–1.00)
Glucose: 162 mg/dL — ABNORMAL HIGH (ref 70–99)
Potassium: 4.6 mmol/L (ref 3.5–5.2)
Sodium: 137 mmol/L (ref 134–144)
eGFR: 67 mL/min/{1.73_m2} (ref >59)

## 2022-08-07 LAB — LIPID PANEL
Chol/HDL Ratio: 1.9 ratio (ref 0.0–4.4)
Cholesterol, Total: 162 mg/dL (ref 100–199)
HDL: 84 mg/dL (ref >39)
LDL Chol Calc (NIH): 62 mg/dL (ref 0–99)
Triglycerides: 88 mg/dL (ref 0–149)
VLDL Cholesterol Cal: 16 mg/dL (ref 5–40)

## 2022-08-13 DIAGNOSIS — E1165 Type 2 diabetes mellitus with hyperglycemia: Secondary | ICD-10-CM | POA: Diagnosis not present

## 2022-08-22 ENCOUNTER — Other Ambulatory Visit: Payer: Self-pay | Admitting: Student

## 2022-08-28 ENCOUNTER — Encounter: Payer: Self-pay | Admitting: Pharmacist

## 2022-08-28 ENCOUNTER — Ambulatory Visit (INDEPENDENT_AMBULATORY_CARE_PROVIDER_SITE_OTHER): Payer: Medicare HMO | Admitting: Pharmacist

## 2022-08-28 VITALS — BP 108/61 | HR 71 | Ht 67.62 in | Wt 185.8 lb

## 2022-08-28 DIAGNOSIS — Z794 Long term (current) use of insulin: Secondary | ICD-10-CM

## 2022-08-28 DIAGNOSIS — E119 Type 2 diabetes mellitus without complications: Secondary | ICD-10-CM | POA: Diagnosis not present

## 2022-08-28 NOTE — Patient Instructions (Signed)
It was nice to see you today!  Your goal blood sugar is 80-130 before eating and less than 180 after eating.  Medication Changes:  There are NO CHANGES to your medications today.  Please bring these on your next visit with Dr. Raymondo Band: Bring all your medications. Bring your [place] 2 Monitor. Tell us your dulaglutide (Trulicity) dose. Are you doing 1.5 mg or 3 mg weekly? Bring information about the group that sends you your sensor.   Keep up the good work with diet and exercise. Aim for a diet full of vegetables, fruit and lean meats (chicken, [place], fish). Try to limit salt intake by eating fresh or frozen vegetables (instead of canned), rinse canned vegetables prior to cooking and do not add any additional salt to meals.

## 2022-08-28 NOTE — Progress Notes (Signed)
S:     Chief Complaint  Patient presents with   Medication Management    T2DM F/U   72 y.o. female who presents for diabetes evaluation, education, and management.  PMH is significant for T2DM, HLD, HTN, GERD, and sarcoidosis .  Patient was referred and last seen by Primary Care Provider, Dr. Royal Piedra, on 08/06/2022.  At last visit, patient's A1C was obtained (10.0%) and no changes to DM regimen was made.   Today, patient arrives in good spirits and presents without any assistance.   Current diabetes medications include: Dulaglutide (Trulicity) 3 MG weekly, Insulin Glargine (BASAGLAR KWIKPEN) 20 units daily, metFORMIN (GLUCOPHAGE-XR) 500 MG daily. Current hypertension medications include: lisinopril (ZESTRIL) 20 MG daily  Current hyperlipidemia medications include: atorvastatin (LIPITOR) 20 MG daily  Patient reports adherence to taking all medications as prescribed; however, patient is unsure whether she has been doing 1.5 mg weekly or 3.0 mg weekly for her dulaglutide (Trulicity).   Do you feel that your medications are working for you? yes Have you been experiencing any side effects to the medications prescribed? no Do you have any problems obtaining medications due to transportation or finances? no Insurance coverage: [employer]  Patient reports hypoglycemic events. Patient reported a hypoglycemic event on 08/27/2022. She states that she was awaken by her monitor with a glucose reading around 60 mg/dL. She shared that she took one cough drop to try to bring her glucose up. Patient was educated on more appropriate strategies to address hypoglycemic events.   Patient denies nocturia (nighttime urination).  Patient reports neuropathy (nerve pain). Patient states this occurs about once every other month. Pain level starts at 1 and slowly goes up to 9, associated with "tingling." Patient denies visual changes. Patient reports self foot exams.   Within the past 12 months, did you  worry whether your food would run out before you got money to buy more? no Within the past 12 months, did the food you bought run out, and you didn't have money to get more? no  O:   Review of Systems  Neurological:  Positive for tingling.  All other systems reviewed and are negative.   Physical Exam Constitutional:      Appearance: Normal appearance.  Pulmonary:     Effort: Pulmonary effort is normal.  Neurological:     Mental Status: She is alert.  Psychiatric:        Mood and Affect: Mood normal.        Behavior: Behavior normal.        Thought Content: Thought content normal.      Lab Results  Component Value Date   HGBA1C 10.0 (A) 08/06/2022   Vitals:   08/28/22 1544  BP: 108/61  Pulse: 71  SpO2: 100%    Lipid Panel     Component Value Date/Time   CHOL 162 08/06/2022 1724   TRIG 88 08/06/2022 1724   HDL 84 08/06/2022 1724   CHOLHDL 1.9 08/06/2022 1724   LDLCALC 62 08/06/2022 1724    Clinical Atherosclerotic Cardiovascular Disease (ASCVD): No  The 10-year ASCVD risk score (Arnett DK, et al., 2019) is: 23.9%   Values used to calculate the score:     Age: 80 years     Sex: Female     Is Non-Hispanic African American: Yes     Diabetic: Yes     Tobacco smoker: No     Systolic Blood Pressure: 108 mmHg     Is BP treated: Yes  HDL Cholesterol: 84 mg/dL     Total Cholesterol: 162 mg/dL    A/P: Diabetes of approximately 40 years duration, currently suboptimal control due to patient reported home glucose readings within the 200-300 mg/dL range and most recent Z6X value of 10% from 08/06/2022. Patient is able to verbalize appropriate hypoglycemia management plan. Medication adherence appears optimal. Patient presents without her Libre 2 monitor. Unable to assess extent of current T2DM control. Reports one episode of low glucose reading. Patient is also unsure of current dulaglutide (Trulicity) regimen (1.5 mg weekly vs 3.0 mg weekly).  -No changes at this time  on patient's current pharmacotherapy regimen. -Patient was instructed to bring all her medications and Libre 2 monitor on her next visit.  -Change to [place] 3 and phone monitoring at next visit.  -Extensively discussed pathophysiology of diabetes, recommended lifestyle interventions, dietary effects on blood sugar control.  -Counseled on s/sx of and management of hypoglycemia.  -Next A1c anticipated in 2-3 months.   Potential Intermittent T2DM-Associated Neuropathy -Patient states she experiences tingling pain which occurs about once every other month. Pain level starts at 1 and slowly goes up to 9. -Patient instructed to monitor, log, and report if it occurs more frequently.  -Will monitor closely.   Written patient instructions provided. Patient verbalized understanding of treatment plan.  Total time in face to face counseling 30 minutes.    Follow-up:  Pharmacist 09/06/2022 No upcoming scheduled PCP visit.  Patient seen with Haze Boyden PharmD Candidate and Bing Plume, PharmD Candidate.

## 2022-08-28 NOTE — Assessment & Plan Note (Signed)
Diabetes of approximately 40 years duration, currently suboptimal control due to patient reported home glucose readings within the 200-300 mg/dL range and most recent U1L value of 10% from 08/06/2022. Patient is able to verbalize appropriate hypoglycemia management plan. Medication adherence appears optimal. Patient presents without her Libre 2 monitor. Unable to assess extent of current T2DM control. Reports one episode of low glucose reading. Patient is also unsure of current dulaglutide (Trulicity) regimen (1.5 mg weekly vs 3.0 mg weekly).  -No changes at this time on patient's current pharmacotherapy regimen. -Patient was instructed to bring all her medications and Libre 2 monitor on her next visit.  -Extensively discussed pathophysiology of diabetes, recommended lifestyle interventions, dietary effects on blood sugar control.  -Counseled on s/sx of and management of hypoglycemia.  -Next A1c anticipated in 2-3 months.

## 2022-08-29 NOTE — Progress Notes (Signed)
Reviewed and agree with Dr Koval's plan.   

## 2022-09-06 ENCOUNTER — Encounter: Payer: Self-pay | Admitting: Pharmacist

## 2022-09-06 ENCOUNTER — Ambulatory Visit (INDEPENDENT_AMBULATORY_CARE_PROVIDER_SITE_OTHER): Payer: Medicare HMO | Admitting: Pharmacist

## 2022-09-06 VITALS — BP 105/65 | HR 82 | Ht 69.0 in | Wt 181.0 lb

## 2022-09-06 DIAGNOSIS — Z794 Long term (current) use of insulin: Secondary | ICD-10-CM | POA: Diagnosis not present

## 2022-09-06 DIAGNOSIS — E119 Type 2 diabetes mellitus without complications: Secondary | ICD-10-CM

## 2022-09-06 MED ORDER — FREESTYLE LIBRE 3 SENSOR MISC
11 refills | Status: DC
Start: 2022-09-06 — End: 2023-10-15

## 2022-09-06 NOTE — Patient Instructions (Signed)
It was nice to see you today!  Your goal blood sugar is 80-130 before eating and less than 180 after eating.  Medication Changes: Continue all medications.   Begin using Freestyle Libre 3 sensors with your phone to monitor your blood sugar.  Fill out paperwork for [employer].   Monitor blood sugars at home and keep a log (glucometer or piece of paper) to bring with you to your next visit.  Keep up the good work with diet and exercise. Aim for a diet full of vegetables, fruit and lean meats (chicken, [place], fish). Try to limit salt intake by eating fresh or frozen vegetables (instead of canned), rinse canned vegetables prior to cooking and do not add any additional salt to meals.

## 2022-09-06 NOTE — Progress Notes (Signed)
S:     Chief Complaint  Patient presents with   Medication Management    Dm follow up     72 y.o. female who presents for diabetes evaluation, education, and management.  PMH is significant for T2DM, HLD, HTN, GERD, and sarcoidosis .  Patient was referred and last seen by Primary Care Provider, Dr. Royal Piedra, on 08/06/2022, where patient's A1C was obtained (10.0%) and no changes to DM regimen was made. Last seen by pharmacy on 08/28/22 where plan ws made to bring all medications and set up [facility] 3 sensor at today's visit.  Today, patient arrives in good spirits and presents without any assistance.  Patient reports Diabetes was diagnosed in the 81s.   Current diabetes medications include: Lantus (insulin glargine) 16 units daily (patient self-adjusted down from 20 units after seeing lows around dinner time), metformin 500 mg daily, dulaglutide (Trulicity) 1.5 mg weekly. Current hypertension medications include: Lisinopril 20 mg daily Current hyperlipidemia medications include: atorvastatin (lipitor) 20 mg daily  Patient reports adherence to taking all medications as prescribed.   Do you feel that your medications are working for you? yes Have you been experiencing any side effects to the medications prescribed? yno Do you have any problems obtaining medications due to transportation or finances? no Insurance coverage: [employer]  Patient denies hypoglycemic events. Reports seeing some morning blood glucose numbers between 80-90.   211 mg/dL during the visit after having a snack.  Patient denies nocturia (nighttime urination).  Patient denies neuropathy (nerve pain). Patient denies visual changes. Patient denies self foot exams.    O:   Review of Systems  All other systems reviewed and are negative.   Physical Exam Constitutional:      Appearance: Normal appearance.  Pulmonary:     Effort: Pulmonary effort is normal.  Neurological:     Mental Status: She is alert.   Psychiatric:        Mood and Affect: Mood normal.        Behavior: Behavior normal.        Thought Content: Thought content normal.        Judgment: Judgment normal.    7 day average blood glucose: 167 mg/dL 16-XWR average: 604 mg/dL 54-UJW average: 119 mg/dL  1/47/8295 CGM data: Libre 2 data  Time in Goal:  - Time in range 70-180: 67% - Time above range: 33% - Time below range: 0% Observed patterns:    Lab Results  Component Value Date   HGBA1C 10.0 (A) 08/06/2022   Vitals:   09/06/22 1600  BP: 105/65  Pulse: 82  SpO2: 100%    Lipid Panel     Component Value Date/Time   CHOL 162 08/06/2022 1724   TRIG 88 08/06/2022 1724   HDL 84 08/06/2022 1724   CHOLHDL 1.9 08/06/2022 1724   LDLCALC 62 08/06/2022 1724    Clinical Atherosclerotic Cardiovascular Disease (ASCVD): No  The 10-year ASCVD risk score (Arnett DK, et al., 2019) is: 22.9%   Values used to calculate the score:     Age: 36 years     Sex: Female     Is Non-Hispanic African American: Yes     Diabetic: Yes     Tobacco smoker: No     Systolic Blood Pressure: 105 mmHg     Is BP treated: Yes     HDL Cholesterol: 84 mg/dL     Total Cholesterol: 162 mg/dL   A/P: Diabetes longstanding recently uncontrolled with most recent A1c 10%  on 08/06/22. Patient is able to verbalize appropriate hypoglycemia management plan. Medication adherence appears good. -Continued basal insulin Lantus(insulin glargine) at 16 units daily in the morning. Consider decreasing at next visit if planning to increase dulaglutide (Trulicity).  -Continued GLP-1 Trulicity (dulaglutide) 1.5 mg. Consider increasing to 3 mg dose at next visit with a decrease in basal insulin.  -Continued metformin 500 mg daily. -[employer] 3 sensor and set up app on phone during visit today. Sent prescription to pharmacy for additional sensors. -Patient educated on purpose, proper use, and potential adverse effects of medications.  -Extensively discussed  pathophysiology of diabetes, recommended lifestyle interventions, dietary effects on blood sugar control.  -Counseled on s/sx of and management of hypoglycemia.  -Next A1c anticipated July 2024.   ASCVD risk - primary prevention in patient with diabetes. Last LDL is 62 at goal of <70 mg/dL. high intensity statin indicated.  -Continued atorvastatin 20 mg.   Hypertension longstanding currently controlled on lisinopril 20 mg. Blood pressure goal of <130/80 mmHg. Medication adherence good.  -Continued lisinopril 20 mg.  Written patient instructions provided. Patient verbalized understanding of treatment plan.  Total time in face to face counseling 24 minutes.    Follow-up:  Pharmacist 10/02/22 at 330 pm Patient seen with Haze Boyden PharmD Candidate and Valeda Malm, PharmD, PGY2 Pharmacy Resident.

## 2022-09-07 NOTE — Assessment & Plan Note (Signed)
Diabetes longstanding recently uncontrolled with most recent A1c 10% on 08/06/22. Patient is able to verbalize appropriate hypoglycemia management plan. Medication adherence appears good. -Continued basal insulin Lantus(insulin glargine) at 16 units daily in the morning. Consider decreasing at next visit if planning to increase dulaglutide (Trulicity).  -Continued GLP-1 Trulicity (dulaglutide) 1.5 mg. Consider increasing to 3 mg dose at next visit with a decrease in basal insulin.  -Continued metformin 500 mg daily. -[employer] 3 sensor and set up app on phone during visit today. Sent prescription to pharmacy for additional sensors. -Patient educated on purpose, proper use, and potential adverse effects of medications.  -Extensively discussed pathophysiology of diabetes, recommended lifestyle interventions, dietary effects on blood sugar control.  -Counseled on s/sx of and management of hypoglycemia.

## 2022-09-07 NOTE — Progress Notes (Signed)
Reviewed and agree with Dr Koval's plan.   

## 2022-09-21 ENCOUNTER — Encounter: Payer: Self-pay | Admitting: Internal Medicine

## 2022-09-21 ENCOUNTER — Ambulatory Visit: Payer: Medicare HMO | Admitting: Internal Medicine

## 2022-09-21 VITALS — BP 110/60 | HR 79 | Temp 98.2°F | Ht 67.0 in | Wt 186.8 lb

## 2022-09-21 DIAGNOSIS — D869 Sarcoidosis, unspecified: Secondary | ICD-10-CM

## 2022-09-21 MED ORDER — BUDESONIDE-FORMOTEROL FUMARATE 160-4.5 MCG/ACT IN AERO: 2.0000 | INHALATION_SPRAY | Freq: Two times a day (BID) | RESPIRATORY_TRACT | 5 refills | Status: DC

## 2022-09-21 NOTE — Progress Notes (Unsigned)
Jeanette Ellis    [id number]    03/06/1951  Primary Care Physician:Dahbura, Isidoro Donning, DO  Referring Physician: Doreene Eland, MD [address] [place],  [place] [address] Reason for Consultation: sarcoidosis Date of Consultation: 09/21/2022  Chief complaint:   Chief Complaint  Patient presents with   Consult    Sarcoidosis.  Has a cough she has had for a year or more with mucous that is pale yellow to while.     HPI: Jeanette Ellis is a 72 y.o. woman with past medical history of sarcoidosis who presents for new patient evaluation. She was diagnosed initially in 1994. Saw Dr. Kriste Basque for a while. Was on prednisone for a while back then.   She had a nephew who passed away a couple of years ago from sarcoidosis and brother and sister have it as well.   She did have childhood asthma. She has been told she wheezes sometimes. The cough is dry, pretty much daily. Also occurs at night. Occasional pale white phlegm.  Denies reflux. No seasonal allergies.   She denies dyspnea.  No kidney stones. No skin changes, no heart palpitations, eye problems.  Her main symptoms of sarcoidosis are related to fatigue.   She has gained about 5 lbs and feels she is having some swelling.   She gets annual dilated eye exams.   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  Exposures: lives at home with husband and son.  Smoking history: never smoker, no passive smoke exposure in life, son recently moved back in   Social History   Occupational History   Not on file  Tobacco Use   Smoking status: Never   Smokeless tobacco: Never  Vaping Use   Vaping Use: Never used  Substance and Sexual Activity   Alcohol use: No   Drug use: No   Sexual activity: Not Currently    Relevant family history:  Family History  Problem Relation Age of Onset   High blood pressure Mother    Diabetes Father     Sarcoidosis Sister    Sarcoidosis Brother    Sarcoidosis Nephew     Past Medical History:  Diagnosis Date   Anemia associated with stage 3 chronic renal failure (HCC) 03/11/2017   Diabetes mellitus without complication (HCC)    High cholesterol    Hypertension    Iron deficiency anemia 03/11/2017   Sarcoidosis     Past Surgical History:  Procedure Laterality Date   ABDOMINAL HYSTERECTOMY       Physical Exam: Blood pressure 110/60, pulse 79, temperature 98.2 F (36.8 C), temperature source Oral, height 5\' 7"  (1.702 m), weight 186 lb 12.8 oz (84.7 kg), SpO2 97 %. Gen:      No acute distress ENT:  no nasal polyps, mucus membranes moist Lungs:    No increased respiratory effort, symmetric chest wall excursion, clear to auscultation bilaterally, no wheezes or crackles CV:         Regular rate and rhythm; no murmurs, rubs, or gallops.  No pedal edema Abd:      + bowel sounds; soft, non-tender; no distension MSK: no acute synovitis of DIP or PIP joints, no mechanics hands.  Skin:      Warm and dry; no rashes Neuro: normal speech, no focal facial asymmetry Psych: alert and oriented x3, normal mood and affect   Data Reviewed/Medical Decision Making:  Independent interpretation of tests: Imaging:  Review of patient's CT Chest April 2019 showed calcified hilar lymph nodes, perilymphatic nodules. Basilar emphysema and some fibrosis. The patient's images have been independently reviewed by me.    PFTs: I have personally reviewed the patient's PFTs and probable mild restriction to ventilation with evidence of air trapping and bronchial hyper-responsiveness.    Latest Ref Rng & Units 09/06/2017    9:03 AM  PFT Results  FVC-Pre L 2.37   FVC-Predicted Pre % 90   FVC-Post L 2.48   FVC-Predicted Post % 94   Pre FEV1/FVC % % 79   Post FEV1/FCV % % 83   FEV1-Pre L 1.88   FEV1-Predicted Pre % 92   FEV1-Post L 2.07   DLCO uncorrected ml/min/mmHg 13.58   DLCO UNC% % 50   DLCO corrected  ml/min/mmHg 14.87   DLCO COR %Predicted % 55   DLVA Predicted % 79   TLC L 4.23   TLC % Predicted % 79   RV % Predicted % 79     Labs:  Lab Results  Component Value Date   NA 137 08/06/2022   K 4.6 08/06/2022   CO2 23 08/06/2022   GLUCOSE 162 (H) 08/06/2022   BUN 16 08/06/2022   CREATININE 0.90 08/06/2022   CALCIUM 9.6 08/06/2022   EGFR 67 08/06/2022   GFRNONAA 59 (L) 08/11/2019   Lab Results  Component Value Date   WBC 4.3 05/12/2019   HGB 10.9 (L) 05/12/2019   HCT 33.0 (L) 05/12/2019   MCV 83 05/12/2019   PLT 194 05/12/2019   Hgb A1c 10% IN aPRIL 2024.   Immunization status:  Immunization History  Administered Date(s) Administered   Fluad Quad(high Dose 65+) 02/03/2019, 01/26/2022   Influenza, High Dose Seasonal PF 01/13/2018   Influenza,inj,Quad PF,6+ Mos 05/11/2016, 01/02/2021   PFIZER Comirnaty(Gray Top)Covid-19 Tri-Sucrose Vaccine 05/15/2019, 06/05/2019, 03/21/2020   PNEUMOCOCCAL CONJUGATE-20 01/02/2021   Pfizer Covid-19 Vaccine Bivalent Booster 9yrs & up 03/22/2021   Pneumococcal Conjugate-13 06/07/2014   Pneumococcal Polysaccharide-23 09/08/2015   Td 04/21/2018     I reviewed prior external note(s) from pulmonary , pcp  I reviewed the result(s) of the labs and imaging as noted above.   I have ordered EKG, ct chest, pft   Assessment:  Pulmonary sarcoidosis Probable Asthma Type 2 DM, insulin dependent  Plan/Recommendations:  Will proceed with PFTs, CT Chest and trial of ICS-LABA. She had been on dulera previously for "asthma" but has been off this for several months due to formulary change.  I would really like to avoid oral steroids on her due to her diabetes (latest A1C 10.%) Hopefully ICS-LABA manages her symptoms.  She is due to follow up with an eye doctor and have dilated exam later this year - she does this annually already.    Baseline serum creatinine for renal sarcoidosis Baseline serum alkaline phosphatase for hepatic  sarcoidosis Baseline serum calcium Baseline serum complete blood count - Labs reviewed in April 2024 are wnl  Baseline EKG for cardiac sarcoidosis - obtained and reviewed today, NSR no evidence of intraventricular conduction delay.   We discussed disease management and progression at length today.   Return to Care: Return in about 3 months (around 12/22/2022).  Durel Salts, MD Pulmonary and Critical Care Medicine [facility] Office:[phone]  CC: Doreene Eland, MD    Reference: Diagnosis and Detection of Sarcoidosis: An Official American Thor[employer] Society Clinical Practice Guideline Am J Respir Crit Care Med Vol 201, Iss  8, pp [id number], Jul 23, 2018

## 2022-09-21 NOTE — Patient Instructions (Addendum)
Please schedule follow up scheduled with myself in 3 months.  If my schedule is not open yet, we will contact you with a reminder closer to that time. Please call [phone] if you haven't heard from [place] a month before.   Before your next visit I would like you to have: PFTs (spiro/dlco)  - you can have this done before your next appointment with me. CT Chest - we will schedule this and call you  Regarding sarcoidosis make sure you follow up with your eye doctor for a dilated eye exam.  The labs from April 2024 are sufficient and within normal limits.   Start taking symbicort 2 puffs twice daily and see if this helps the coughing symptoms.  In the mean time we will gather some information with the testing about the state of your sarcoidosis.

## 2022-09-24 ENCOUNTER — Telehealth: Payer: Self-pay | Admitting: Internal Medicine

## 2022-09-24 ENCOUNTER — Other Ambulatory Visit (HOSPITAL_COMMUNITY): Payer: Self-pay

## 2022-09-24 NOTE — Telephone Encounter (Signed)
Pt. Needs alternative on prescription inhaler SYMBICORT has a high copay

## 2022-09-25 ENCOUNTER — Other Ambulatory Visit (HOSPITAL_COMMUNITY): Payer: Self-pay

## 2022-09-25 NOTE — Telephone Encounter (Signed)
Symbicort seems to be the preferred medication under the plan, patient may have a deductible to meet making the co-pay higher than normal.

## 2022-09-26 NOTE — Telephone Encounter (Signed)
ATC no voicemail available. Wanted to update pt on high deductible.

## 2022-09-27 NOTE — Telephone Encounter (Signed)
Spoke to pt. And let her know about deductible so closing encounter

## 2022-10-01 ENCOUNTER — Telehealth: Payer: Self-pay

## 2022-10-01 NOTE — Telephone Encounter (Signed)
Patient calls nurse line requesting that prescription for Freestyle Libre 3 sensors be sent to [facility].   Called and LVM at [facility] to determine process for placing order.   Will await response.   Veronda Prude, RN

## 2022-10-02 ENCOUNTER — Ambulatory Visit: Payer: Medicare HMO | Admitting: Pharmacist

## 2022-10-09 DIAGNOSIS — E1165 Type 2 diabetes mellitus with hyperglycemia: Secondary | ICD-10-CM | POA: Diagnosis not present

## 2022-10-16 ENCOUNTER — Ambulatory Visit (INDEPENDENT_AMBULATORY_CARE_PROVIDER_SITE_OTHER): Payer: Medicare HMO | Admitting: Pharmacist

## 2022-10-16 ENCOUNTER — Other Ambulatory Visit (HOSPITAL_COMMUNITY): Payer: Self-pay

## 2022-10-16 ENCOUNTER — Encounter: Payer: Self-pay | Admitting: Pharmacist

## 2022-10-16 VITALS — BP 128/68 | HR 71 | Ht 68.0 in | Wt 185.2 lb

## 2022-10-16 DIAGNOSIS — E119 Type 2 diabetes mellitus without complications: Secondary | ICD-10-CM | POA: Diagnosis not present

## 2022-10-16 DIAGNOSIS — Z794 Long term (current) use of insulin: Secondary | ICD-10-CM | POA: Diagnosis not present

## 2022-10-16 MED ORDER — DULAGLUTIDE 3 MG/0.5ML ~~LOC~~ SOAJ: 3.0000 mg | SUBCUTANEOUS | Status: DC

## 2022-10-16 MED ORDER — BASAGLAR KWIKPEN 100 UNIT/ML ~~LOC~~ SOPN
8.0000 [IU] | PEN_INJECTOR | Freq: Every day | SUBCUTANEOUS | Status: DC
Start: 2022-10-16 — End: 2023-09-06

## 2022-10-16 MED ORDER — INSULIN LISPRO (1 UNIT DIAL) 100 UNIT/ML (KWIKPEN)
4.0000 [IU] | PEN_INJECTOR | Freq: Two times a day (BID) | SUBCUTANEOUS | 11 refills | Status: AC
Start: 2022-10-16 — End: ?

## 2022-10-16 NOTE — Progress Notes (Signed)
S:     Chief Complaint  Patient presents with   Medication Management    Diabetes - CGM    72 y.o. female who presents for follow-up diabetes evaluation, education, and management.  PMH is significant for T2DM, HLD, HTN, GERD, and sarcoidosis .  Patient was referred and last seen by Primary Care Provider, Dr. Royal Piedra, on 08/06/2022, where patient's A1C was obtained (10.0%). Last seen by pharmacy on 09/06/22 where [place] 3 sensor was set up. Unfortunately she has been unable to get supplies from [place] [facility] at this time for the [place] 3 - requests assistance today.    Today, patient arrives in good spirits and presents without any assistance.   Patient reports Diabetes was diagnosed in the 37s.    Current diabetes medications include: Basaglar (insulin glargine) 16 units daily, metformin 500 mg daily, dulaglutide (Trulicity) 1.5 mg weekly.   Patient reports adherence to taking all medications as prescribed.    Do you feel that your medications are working for you? yes Have you been experiencing any side effects to the medications prescribed? yno Do you have any problems obtaining medications due to transportation or finances? no Insurance coverage: Aetna   Patient noticed multiple hypoglycemic events requiring management.     O:   Review of Systems  All other systems reviewed and are negative.   Physical Exam Vitals reviewed.  Constitutional:      Appearance: Normal appearance.  Pulmonary:     Effort: Pulmonary effort is normal.  Neurological:     Mental Status: She is alert.  Psychiatric:        Mood and Affect: Mood normal.        Behavior: Behavior normal.        Thought Content: Thought content normal.    Libre 2 CGM Download: today from handheld reader.  Average Glucose: 164 mg/dL Glucose Management Indicator: 6.5  Glucose Variability: 36.7 (goal <36%) Time in Goal:  - Time in range 70-180: 60% - Time above range: 39% - Time below range:  1% Observed patterns: Nocturnal lows   Lab Results  Component Value Date   HGBA1C 10.0 (A) 08/06/2022   Vitals:   10/16/22 1524  BP: 128/68  Pulse: 71  SpO2: 99%    Lipid Panel     Component Value Date/Time   CHOL 162 08/06/2022 1724   TRIG 88 08/06/2022 1724   HDL 84 08/06/2022 1724   CHOLHDL 1.9 08/06/2022 1724   LDLCALC 62 08/06/2022 1724    Clinical Atherosclerotic Cardiovascular Disease (ASCVD): No  The 10-year ASCVD risk score (Arnett DK, et al., 2019) is: 30.1%   Values used to calculate the score:     Age: 72 years     Sex: Female     Is Non-Hispanic African American: Yes     Diabetic: Yes     Tobacco smoker: No     Systolic Blood Pressure: 128 mmHg     Is BP treated: Yes     HDL Cholesterol: 84 mg/dL     Total Cholesterol: 162 mg/dL    A/P: Diabetes longstanding recently uncontrolled with most recent A1c 10% on 08/06/22. Patient is able to verbalize appropriate hypoglycemia management plan. Medication adherence appears good.  Now appears to be having low reading at night.  High variability is obvious in her CGM report.  Patient is willing to add bolus insulin to regimen.  Med assistance updates needed.  -Reduced basal insulin Basaglar (insulin glargine) from 16 to  14 daily in the morning. Plan is ti decrease to 8 units when bolus insulin is available.  -Continued GLP-1 Trulicity (dulaglutide) 1.5 mg and plan to increase to 3mg  with new medication assistance supply.    -Start Bolus insulin Humalog (insulin lispro) 4 units prior to large meals (1-2 times per day) when this is available from her Medication assistance delivery.  -Continued metformin 500 mg daily. -Plan to RE-apply for supply of Libre 3 sensor at [place] [facility].   -Patient educated on purpose, proper use, and potential adverse effects of medications.  -Extensively discussed pathophysiology of diabetes, recommended lifestyle interventions, dietary effects on blood sugar control.  -Counseled on  s/sx of and management of hypoglycemia.   Libre 3 sensor assistance in ordering being coordinated with the help of [employer], CPhT.   Written patient instructions provided. Patient verbalized understanding of treatment plan.  Total time in face to face counseling 49 minutes.    Follow-up:  Pharmacist 1 month. PCP clinic visit in PRN.

## 2022-10-16 NOTE — Patient Instructions (Addendum)
It was nice to see you today!  Your goal blood sugar is 80-130 before eating and less than 180 after eating.  Medication Changes: Minimal until the supply from [place] arrives.  Continue Trulicity 1.5mg  weekly unless you have more than 5 pens,   If you have more than 5 Trulicty pens begin taking two pens weekly until the next supply arrives.  Decrease Basaglar to 14 units.   When your supply from [place] [facility] arrives,  Decrease your Basaglar to 8 units START Humalog insulin 4 units prior to your 1 or 2 large meals of the day. Always take one dose per day.  Two doses if you have two larger meals.  Start Trulicity 3mg  once weekly.    Keep up the good work with diet and exercise. Aim for a diet full of vegetables, fruit and lean meats (chicken, [place], fish). Try to limit salt intake by eating fresh or frozen vegetables (instead of canned), rinse canned vegetables prior to cooking and do not add any additional salt to meals.

## 2022-10-16 NOTE — Assessment & Plan Note (Signed)
Diabetes longstanding recently uncontrolled with most recent A1c 10% on 08/06/22. Patient is able to verbalize appropriate hypoglycemia management plan. Medication adherence appears good.  Now appears to be having low reading at night.  High variability is obvious in her CGM report.  Patient is willing to add bolus insulin to regimen.  Med assistance updates needed.  -Reduced basal insulin Basaglar (insulin glargine) from 16 to 14 daily in the morning. Plan is ti decrease to 8 units when bolus insulin is available.  -Continued GLP-1 Trulicity (dulaglutide) 1.5 mg and plan to increase to 3mg  with new medication assistance supply.    -Start Bolus insulin Humalog (insulin lispro) 4 units prior to large meals (1-2 times per day) when this is available from her Medication assistance delivery.  -Continued metformin 500 mg daily. -Plan to RE-apply for supply of Libre 3 sensor at [place] [facility].   -Patient educated on purpose, proper use, and potential adverse effects of medications.  -Extensively discussed pathophysiology of diabetes, recommended lifestyle interventions, dietary effects on blood sugar control.  -Counseled on s/sx of and management of hypoglycemia.

## 2022-10-17 ENCOUNTER — Telehealth: Payer: Self-pay

## 2022-10-17 NOTE — Telephone Encounter (Signed)
Submitted CGM order for [employer] via [place].   Supplier: [place] [facility]

## 2022-10-17 NOTE — Progress Notes (Signed)
Reviewed and agree with Dr Koval's plan.   

## 2022-10-22 ENCOUNTER — Ambulatory Visit (HOSPITAL_BASED_OUTPATIENT_CLINIC_OR_DEPARTMENT_OTHER)
Admission: RE | Admit: 2022-10-22 | Discharge: 2022-10-22 | Disposition: A | Discharge status: Home / Self Care | Payer: Medicare HMO | Source: Ambulatory Visit | Attending: Internal Medicine | Admitting: Internal Medicine

## 2022-10-22 DIAGNOSIS — D869 Sarcoidosis, unspecified: Secondary | ICD-10-CM | POA: Diagnosis not present

## 2022-10-22 DIAGNOSIS — I700 Atherosclerosis of aorta: Secondary | ICD-10-CM | POA: Diagnosis not present

## 2022-10-22 DIAGNOSIS — R590 Localized enlarged lymph nodes: Secondary | ICD-10-CM | POA: Diagnosis not present

## 2022-10-23 ENCOUNTER — Ambulatory Visit (INDEPENDENT_AMBULATORY_CARE_PROVIDER_SITE_OTHER): Payer: Medicare HMO | Admitting: *Deleted

## 2022-10-23 ENCOUNTER — Telehealth: Payer: Self-pay

## 2022-10-23 DIAGNOSIS — Z1211 Encounter for screening for malignant neoplasm of colon: Secondary | ICD-10-CM

## 2022-10-23 DIAGNOSIS — Z780 Asymptomatic menopausal state: Secondary | ICD-10-CM

## 2022-10-23 DIAGNOSIS — Z0000 Encounter for general adult medical examination without abnormal findings: Secondary | ICD-10-CM | POA: Diagnosis not present

## 2022-10-23 NOTE — Progress Notes (Addendum)
Subjective:   Jeanette Ellis is a 72 y.o. female who presents for Medicare Annual (Subsequent) preventive examination.  Visit Complete: Virtual  I connected with  Jeanette Ellis on 10/23/22 by a audio enabled telemedicine application and verified that I am speaking with the correct person using two identifiers.  Patient Location: Home  Provider Location: Home Office  I discussed the limitations of evaluation and management by telemedicine. The patient expressed understanding and agreed to proceed.    Review of Systems     Cardiac Risk Factors include: advanced age (>12men, >8 women);diabetes mellitus;hypertension;obesity (BMI >30kg/m2)     Objective:    Today's Vitals   There is no height or weight on file to calculate BMI.     10/23/2022    3:06 PM 08/06/2022    3:32 PM 04/06/2022   10:24 AM 02/16/2022   11:18 AM 01/26/2022   11:22 AM 06/21/2021   11:24 AM 06/06/2021    2:41 PM  Advanced Directives  Does Patient Have a Medical Advance Directive? No No No No No No No  Would patient like information on creating a medical advance directive? No - Patient declined No - Patient declined No - Patient declined No - Patient declined No - Patient declined No - Patient declined No - Patient declined    Current Medications (verified) Outpatient Encounter Medications as of 10/23/2022  Medication Sig   albuterol (PROVENTIL HFA;VENTOLIN HFA) 108 (90 Base) MCG/ACT inhaler Inhale 2 puffs into the lungs every 4 (four) hours as needed for wheezing or shortness of breath (cough, shortness of breath or wheezing.).   atorvastatin (LIPITOR) 20 MG tablet Take 1 tablet by mouth once daily   budesonide-formoterol (SYMBICORT) 160-4.5 MCG/ACT inhaler Inhale 2 puffs into the lungs in the morning and at bedtime.   Continuous Glucose Sensor (FREESTYLE LIBRE 3 SENSOR) MISC Place 1 sensor on the skin every 14 days. Use to check glucose continuously   Dulaglutide 3 MG/0.5ML SOPN Inject 3 mg into  the skin once a week.   Insulin Glargine (BASAGLAR KWIKPEN) 100 UNIT/ML Inject 8 Units into the skin daily. At breakfast   insulin lispro (HUMALOG KWIKPEN) 100 UNIT/ML KwikPen Inject 4 Units into the skin 2 (two) times daily before a meal.   lisinopril (ZESTRIL) 20 MG tablet Take 1 tablet by mouth once daily   metFORMIN (GLUCOPHAGE-XR) 500 MG 24 hr tablet Take 1 tablet by mouth once daily with breakfast   Multiple Vitamin (MULTIVITAMIN WITH MINERALS) TABS tablet Take 1 tablet by mouth daily.   valACYclovir (VALTREX) 500 MG tablet Take 1 tablet (500 mg total) by mouth 2 (two) times daily. At the first sign of a herpes outbreak   No facility-administered encounter medications on file as of 10/23/2022.    Allergies (verified) Patient has no known allergies.   History: Past Medical History:  Diagnosis Date   Anemia associated with stage 3 chronic renal failure (HCC) 03/11/2017   Diabetes mellitus without complication (HCC)    High cholesterol    Hypertension    Iron deficiency anemia 03/11/2017   Sarcoidosis    Past Surgical History:  Procedure Laterality Date   ABDOMINAL HYSTERECTOMY     Family History  Problem Relation Age of Onset   High blood pressure Mother    Diabetes Father    Sarcoidosis Sister    Sarcoidosis Brother    Sarcoidosis Nephew    Social History   Socioeconomic History   Marital status: Married    Spouse name:  Zynzulu   Number of children: 4   Years of education: Not on file   Highest education level: Master's degree (e.g., MA, MS, MEng, MEd, MSW, MBA)  Occupational History   Not on file  Tobacco Use   Smoking status: Never   Smokeless tobacco: Never  Vaping Use   Vaping status: Never Used  Substance and Sexual Activity   Alcohol use: No   Drug use: No   Sexual activity: Not Currently  Other Topics Concern   Not on file  Social History Narrative   Not on file   Social Determinants of Health   Financial Resource Strain: Medium Risk (10/23/2022)    Overall Financial Resource Strain (CARDIA)    Difficulty of Paying Living Expenses: Somewhat hard  Food Insecurity: No Food Insecurity (10/23/2022)   Hunger Vital Sign    Worried About Running Out of Food in the Last Year: Never true    Ran Out of Food in the Last Year: Never true  Transportation Needs: No Transportation Needs (10/23/2022)   PRAPARE - Administrator, Civil Service (Medical): No    Lack of Transportation (Non-Medical): No  Recent Concern: Transportation Needs - Unmet Transportation Needs (10/15/2022)   PRAPARE - Administrator, Civil Service (Medical): Yes    Lack of Transportation (Non-Medical): No  Physical Activity: Inactive (10/23/2022)   Exercise Vital Sign    Days of Exercise per Week: 0 days    Minutes of Exercise per Session: 0 min  Stress: Stress Concern Present (10/23/2022)   [employer] of Occupational Health - Occupational Stress Questionnaire    Feeling of Stress : To some extent  Social Connections: Socially Integrated (10/23/2022)   Social Connection and Isolation Panel [NHANES]    Frequency of Communication with Friends and Family: More than three times a week    Frequency of Social Gatherings with Friends and Family: Twice a week    Attends Religious Services: More than 4 times per year    Active Member of [employer] or Organizations: Yes    Attends Engineer, structural: More than 4 times per year    Marital Status: Married    Tobacco Counseling Counseling given: Not Answered   Clinical Intake:  Pre-visit preparation completed: Yes  Pain : No/denies pain     Diabetes: Yes CBG done?: No Did pt. bring in CBG monitor from home?: No  How often do you need to have someone help you when you read instructions, pamphlets, or other written materials from your doctor or pharmacy?: 1 - Never  Interpreter Needed?: No  Information entered by :: Remi Haggard LPN   Activities of Daily Living    10/23/2022    3:07 PM   In your present state of health, do you have any difficulty performing the following activities:  Hearing? 0  Vision? 0  Difficulty concentrating or making decisions? 0  Walking or climbing stairs? 0  Dressing or bathing? 0  Doing errands, shopping? 0  Preparing Food and eating ? N  Using the Toilet? N  In the past six months, have you accidently leaked urine? N  Do you have problems with loss of bowel control? N  Managing your Medications? N  Managing your Finances? N  Housekeeping or managing your Housekeeping? N    Patient Care Team: Shelby Mattocks, DO as PCP - General (Family Medicine) Patient, No Pcp Per (General Practice)  Indicate any recent Medical Services you may have received from other than  [facility] providers in the past year (date may be approximate).     Assessment:   This is a routine wellness examination for Sonji.  Hearing/Vision screen Hearing Screening - Comments:: No trouble hearing Vision Screening - Comments:: Up to date [facility]  Dietary issues and exercise activities discussed:     Goals Addressed             This Visit's Progress    Patient Stated       Get sugar under control       Depression Screen    10/23/2022    3:12 PM 08/06/2022    3:36 PM 04/06/2022   10:23 AM 02/16/2022   11:30 AM 01/26/2022   11:27 AM 06/21/2021   11:24 AM 06/06/2021    2:38 PM  PHQ 2/9 Scores  PHQ - 2 Score 0 0 0 0 1 2 0  PHQ- 9 Score 3 0 0 5 5 4 1     Fall Risk    10/23/2022    3:06 PM 08/06/2022    3:30 PM 04/06/2022   10:24 AM 01/26/2022   11:21 AM 06/06/2021    2:38 PM  Fall Risk   Falls in the past year? 0 0 0 0 0  Number falls in past yr: 0  0 0 0  Injury with Fall? 0  0 0 0  Follow up Falls evaluation completed;Education provided;Falls prevention discussed        MEDICARE RISK AT HOME:  Medicare Risk at Home - 10/23/22 1507     Any stairs in or around the home? Yes    If so, are there any without handrails? No    Home free of loose throw  rugs in walkways, pet beds, electrical cords, etc? Yes    Adequate lighting in your home to reduce risk of falls? Yes    Life alert? No    Use of a cane, walker or w/c? No    Grab bars in the bathroom? No    Shower chair or bench in shower? Yes    Elevated toilet seat or a handicapped toilet? No             TIMED UP AND GO:  Was the test performed?  No    Cognitive Function:        10/23/2022    3:09 PM 05/30/2021   11:12 AM 07/07/2018   11:35 AM  6CIT Screen  What Year? 0 points 0 points 0 points  What month? 0 points 0 points 0 points  What time? 0 points 0 points 0 points  Count back from 20 0 points 0 points 0 points  Months in reverse 0 points 0 points 0 points  Repeat phrase  0 points 0 points  Total Score  0 points 0 points    Immunizations Immunization History  Administered Date(s) Administered   Fluad Quad(high Dose 65+) 02/03/2019, 01/26/2022   Influenza, High Dose Seasonal PF 01/13/2018   Influenza,inj,Quad PF,6+ Mos 05/11/2016, 01/02/2021   PFIZER Comirnaty(Gray Top)Covid-19 Tri-Sucrose Vaccine 05/15/2019, 06/05/2019, 03/21/2020   PNEUMOCOCCAL CONJUGATE-20 01/02/2021   Pfizer Covid-19 Vaccine Bivalent Booster 13yrs & up 03/22/2021   Pneumococcal Conjugate-13 06/07/2014   Pneumococcal Polysaccharide-23 09/08/2015   Td 04/21/2018    TDAP status: Up to date  Flu Vaccine status: Up to date  Pneumococcal vaccine status: Up to date  Covid-19 vaccine status: Information provided on how to obtain vaccines.   Qualifies for Shingles Vaccine? Yes   Zostavax completed No  Shingrix Completed?: No.    Education has been provided regarding the importance of this vaccine. Patient has been advised to call insurance company to determine out of pocket expense if they have not yet received this vaccine. Advised may also receive vaccine at local pharmacy or Health Dept. Verbalized acceptance and understanding.  Screening Tests Health Maintenance  Topic Date Due    Zoster Vaccines- Shingrix (1 of 2) Never done   COVID-19 Vaccine (5 - 2023-24 season) 12/08/2021   FOOT EXAM  05/24/2022   OPHTHALMOLOGY EXAM  09/13/2022   Colonoscopy  11/11/2022   INFLUENZA VACCINE  11/08/2022   HEMOGLOBIN A1C  02/05/2023   Diabetic kidney evaluation - eGFR measurement  08/06/2023   Diabetic kidney evaluation - Urine ACR  08/06/2023   Medicare Annual Wellness (AWV)  10/23/2023   MAMMOGRAM  05/04/2024   DTaP/Tdap/Td (2 - Tdap) 04/21/2028   Pneumonia Vaccine 22+ Years old  Completed   DEXA SCAN  Completed   Hepatitis C Screening  Completed   HPV VACCINES  Aged Out    Health Maintenance  Health Maintenance Due  Topic Date Due   Zoster Vaccines- Shingrix (1 of 2) Never done   COVID-19 Vaccine (5 - 2023-24 season) 12/08/2021   FOOT EXAM  05/24/2022   OPHTHALMOLOGY EXAM  09/13/2022   Colonoscopy  11/11/2022    Colorectal cancer screening: Referral to GI placed  . Pt aware the office will call re: appt.  Mammogram status: Completed  . Repeat every year  Bone Density status: Ordered  . Pt provided with contact info and advised to call to schedule appt.  Lung Cancer Screening: (Low Dose CT Chest recommended if Age 20-80 years, 20 pack-year currently smoking OR have quit w/in 15years.) does not qualify.   Lung Cancer Screening Referral:   Additional Screening:  Hepatitis C Screening: does not qualify; Completed 2023  Vision Screening: Recommended annual ophthalmology exams for early detection of glaucoma and other disorders of the eye. Is the patient up to date with their annual eye exam?  Yes  Who is the provider or what is the name of the office in which the patient attends annual eye exams? [facility] If pt is not established with a provider, would they like to be referred to a provider to establish care? No .   Dental Screening: Recommended annual dental exams for proper oral hygiene  Nutrition Risk Assessment:  Has the patient had any N/V/D within the last  2 months?  No  Does the patient have any non-healing wounds?  No  Has the patient had any unintentional weight loss or weight gain?  No   Diabetes:  Is the patient diabetic?  Yes  If diabetic, was a CBG obtained today?  No  Did the patient bring in their glucometer from home?  No  How often do you monitor your CBG's? 5-6 times a day.   Financial Strains and Diabetes Management:  Are you having any financial strains with the device, your supplies or your medication? No .  Does the patient want to be seen by Chronic Care Management for management of their diabetes?  No  Would the patient like to be referred to a Nutritionist or for Diabetic Management?  No   Diabetic Exams:  Diabetic Eye Exam: Completed Pt has been advised about the importance in completing this exam. A referral has been placed today. Message sent to referral coordinator for scheduling purposes. Advised pt to expect a call from office referred to regarding  appt.  Diabetic Foot Exam: Pt has been advised about the importance in completing this exam.   Community Resource Referral / Chronic Care Management: CRR required this visit?  No   CCM required this visit?  No     Plan:     I have personally reviewed and noted the following in the patient's chart:   Medical and social history Use of alcohol, tobacco or illicit drugs  Current medications and supplements including opioid prescriptions. Patient is not currently taking opioid prescriptions. Functional ability and status Nutritional status Physical activity Advanced directives List of other physicians Hospitalizations, surgeries, and ER visits in previous 12 months Vitals Screenings to include cognitive, depression, and falls Referrals and appointments  In addition, I have reviewed and discussed with patient certain preventive protocols, quality metrics, and best practice recommendations. A written personalized care plan for preventive services as well as  general preventive health recommendations were provided to patient.     Remi Haggard, LPN   07/16/8117   After Visit Summary: (MyChart) Due to this being a telephonic visit, the after visit summary with patients personalized plan was offered to patient via MyChart   Nurse Notes:       I have reviewed this visit and agree with the documentation.  Marshall L Chambliss

## 2022-10-23 NOTE — Patient Instructions (Signed)
[name] , Thank you for taking time to come for your Medicare Wellness Visit. I appreciate your ongoing commitment to your health goals. Please review the following plan we discussed and let me know if I can assist you in the future.   Screening recommendations/referrals: Colonoscopy: Education provided Mammogram: up to date Bone Density: Education provided Recommended yearly ophthalmology/optometry visit for glaucoma screening and checkup Recommended yearly dental visit for hygiene and checkup  Vaccinations: Influenza vaccine: up tod ate Pneumococcal vaccine: up to date Tdap vaccine: up to date Shingles vaccine: 1 of 2    Advanced directives: Education provided    Preventive Care 65 Years and Older, Female Preventive care refers to lifestyle choices and visits with your health care provider that can promote health and wellness. What does preventive care include? A yearly physical exam. This is also called an annual well check. Dental exams once or twice a year. Routine eye exams. Ask your health care provider how often you should have your eyes checked. Personal lifestyle choices, including: Daily care of your teeth and gums. Regular physical activity. Eating a healthy diet. Avoiding tobacco and drug use. Limiting alcohol use. Practicing safe sex. Taking low-dose aspirin every day. Taking vitamin and mineral supplements as recommended by your health care provider. What happens during an annual well check? The services and screenings done by your health care provider during your annual well check will depend on your age, overall health, lifestyle risk factors, and family history of disease. Counseling  Your health care provider may ask you questions about your: Alcohol use. Tobacco use. Drug use. Emotional well-being. Home and relationship well-being. Sexual activity. Eating habits. History of falls. Memory and ability to understand (cognition). Work and work  Astronomer. Reproductive health. Screening  You may have the following tests or measurements: Height, weight, and BMI. Blood pressure. Lipid and cholesterol levels. These may be checked every 5 years, or more frequently if you are over 73 years old. Skin check. Lung cancer screening. You may have this screening every year starting at age 35 if you have a 30-pack-year history of smoking and currently smoke or have quit within the past 15 years. Fecal occult blood test (FOBT) of the stool. You may have this test every year starting at age 55. Flexible sigmoidoscopy or colonoscopy. You may have a sigmoidoscopy every 5 years or a colonoscopy every 10 years starting at age 45. Hepatitis C blood test. Hepatitis B blood test. Sexually transmitted disease (STD) testing. Diabetes screening. This is done by checking your blood sugar (glucose) after you have not eaten for a while (fasting). You may have this done every 1-3 years. Bone density scan. This is done to screen for osteoporosis. You may have this done starting at age 41. Mammogram. This may be done every 1-2 years. Talk to your health care provider about how often you should have regular mammograms. Talk with your health care provider about your test results, treatment options, and if necessary, the need for more tests. Vaccines  Your health care provider may recommend certain vaccines, such as: Influenza vaccine. This is recommended every year. Tetanus, diphtheria, and acellular pertussis (Tdap, Td) vaccine. You may need a Td booster every 10 years. Zoster vaccine. You may need this after age 71. Pneumococcal 13-valent conjugate (PCV13) vaccine. One dose is recommended after age 29. Pneumococcal polysaccharide (PPSV23) vaccine. One dose is recommended after age 73. Talk to your health care provider about which screenings and vaccines you need and how often you  need them. This information is not intended to replace advice given to you by  your health care provider. Make sure you discuss any questions you have with your health care provider. Document Released: 04/22/2015 Document Revised: 12/14/2015 Document Reviewed: 01/25/2015 Elsevier Interactive Patient Education  2017 [employer].  Fall Prevention in the Home Falls can cause injuries. They can happen to people of all ages. There are many things you can do to make your home safe and to help prevent falls. What can I do on the outside of my home? Regularly fix the edges of walkways and driveways and fix any cracks. Remove anything that might make you trip as you walk through a door, such as a raised step or threshold. Trim any bushes or trees on the path to your home. Use bright outdoor lighting. Clear any walking paths of anything that might make someone trip, such as rocks or tools. Regularly check to see if handrails are loose or broken. Make sure that both sides of any steps have handrails. Any raised decks and porches should have guardrails on the edges. Have any leaves, snow, or ice cleared regularly. Use sand or salt on walking paths during winter. Clean up any spills in your garage right away. This includes oil or grease spills. What can I do in the bathroom? Use night lights. Install grab bars by the toilet and in the tub and shower. Do not use towel bars as grab bars. Use non-skid mats or decals in the tub or shower. If you need to sit down in the shower, use a plastic, non-slip stool. Keep the floor dry. Clean up any water that spills on the floor as soon as it happens. Remove soap buildup in the tub or shower regularly. Attach bath mats securely with double-sided non-slip rug tape. Do not have throw rugs and other things on the floor that can make you trip. What can I do in the bedroom? Use night lights. Make sure that you have a light by your bed that is easy to reach. Do not use any sheets or blankets that are too big for your bed. They should not hang  down onto the floor. Have a firm chair that has side arms. You can use this for support while you get dressed. Do not have throw rugs and other things on the floor that can make you trip. What can I do in the kitchen? Clean up any spills right away. Avoid walking on wet floors. Keep items that you use a lot in easy-to-reach places. If you need to reach something above you, use a strong step stool that has a grab bar. Keep electrical cords out of the way. Do not use floor polish or wax that makes floors slippery. If you must use wax, use non-skid floor wax. Do not have throw rugs and other things on the floor that can make you trip. What can I do with my stairs? Do not leave any items on the stairs. Make sure that there are handrails on both sides of the stairs and use them. Fix handrails that are broken or loose. Make sure that handrails are as long as the stairways. Check any carpeting to make sure that it is firmly attached to the stairs. Fix any carpet that is loose or worn. Avoid having throw rugs at the top or bottom of the stairs. If you do have throw rugs, attach them to the floor with carpet tape. Make sure that you have a light switch  at the top of the stairs and the bottom of the stairs. If you do not have them, ask someone to add them for you. What else can I do to help prevent falls? Wear shoes that: Do not have high heels. Have rubber bottoms. Are comfortable and fit you well. Are closed at the toe. Do not wear sandals. If you use a stepladder: Make sure that it is fully opened. Do not climb a closed stepladder. Make sure that both sides of the stepladder are locked into place. Ask someone to hold it for you, if possible. Clearly mark and make sure that you can see: Any grab bars or handrails. First and last steps. Where the edge of each step is. Use tools that help you move around (mobility aids) if they are needed. These  include: Canes. Walkers. Scooters. Crutches. Turn on the lights when you go into a dark area. Replace any light bulbs as soon as they burn out. Set up your furniture so you have a clear path. Avoid moving your furniture around. If any of your floors are uneven, fix them. If there are any pets around you, be aware of where they are. Review your medicines with your doctor. Some medicines can make you feel dizzy. This can increase your chance of falling. Ask your doctor what other things that you can do to help prevent falls. This information is not intended to replace advice given to you by your health care provider. Make sure you discuss any questions you have with your health care provider. Document Released: 01/20/2009 Document Revised: 09/01/2015 Document Reviewed: 04/30/2014 Elsevier Interactive Patient Education  2017 [employer].

## 2022-10-23 NOTE — Telephone Encounter (Signed)
Submitted re-enrollment application for TRULICITY 3MG /0.5ML, HUMALOG, & BASAGLAR to [employer] for patient assistance.   Phone: [phone]

## 2022-10-25 NOTE — Telephone Encounter (Signed)
Received notification from [facility] [employer] regarding approval for TRULICITY 3MG /0.5ML, HUMALOG KWIKPENS, & BASAGLAR KWIKPENS. Patient assistance approved from 10/23/22 to 04/09/23.  All medications will ship to patients home  Phone: [phone]

## 2022-10-31 NOTE — Telephone Encounter (Signed)
Order accepted 10/17/22.

## 2022-11-07 ENCOUNTER — Other Ambulatory Visit: Payer: Self-pay | Admitting: Student

## 2022-11-07 DIAGNOSIS — I10 Essential (primary) hypertension: Secondary | ICD-10-CM

## 2022-11-07 DIAGNOSIS — E785 Hyperlipidemia, unspecified: Secondary | ICD-10-CM

## 2022-11-08 DIAGNOSIS — D860 Sarcoidosis of lung: Secondary | ICD-10-CM | POA: Diagnosis not present

## 2022-11-08 DIAGNOSIS — E785 Hyperlipidemia, unspecified: Secondary | ICD-10-CM | POA: Diagnosis not present

## 2022-11-08 DIAGNOSIS — N189 Chronic kidney disease, unspecified: Secondary | ICD-10-CM | POA: Diagnosis not present

## 2022-11-08 DIAGNOSIS — A609 Anogenital herpesviral infection, unspecified: Secondary | ICD-10-CM | POA: Diagnosis not present

## 2022-11-08 DIAGNOSIS — Z794 Long term (current) use of insulin: Secondary | ICD-10-CM | POA: Diagnosis not present

## 2022-11-08 DIAGNOSIS — I129 Hypertensive chronic kidney disease with stage 1 through stage 4 chronic kidney disease, or unspecified chronic kidney disease: Secondary | ICD-10-CM | POA: Diagnosis not present

## 2022-11-08 DIAGNOSIS — Z008 Encounter for other general examination: Secondary | ICD-10-CM | POA: Diagnosis not present

## 2022-11-08 DIAGNOSIS — R32 Unspecified urinary incontinence: Secondary | ICD-10-CM | POA: Diagnosis not present

## 2022-11-08 DIAGNOSIS — Z7985 Long-term (current) use of injectable non-insulin antidiabetic drugs: Secondary | ICD-10-CM | POA: Diagnosis not present

## 2022-11-08 DIAGNOSIS — M1990 Unspecified osteoarthritis, unspecified site: Secondary | ICD-10-CM | POA: Diagnosis not present

## 2022-11-08 DIAGNOSIS — E1122 Type 2 diabetes mellitus with diabetic chronic kidney disease: Secondary | ICD-10-CM | POA: Diagnosis not present

## 2022-11-08 DIAGNOSIS — Z7951 Long term (current) use of inhaled steroids: Secondary | ICD-10-CM | POA: Diagnosis not present

## 2022-11-08 DIAGNOSIS — E119 Type 2 diabetes mellitus without complications: Secondary | ICD-10-CM | POA: Diagnosis not present

## 2022-11-08 DIAGNOSIS — Z7984 Long term (current) use of oral hypoglycemic drugs: Secondary | ICD-10-CM | POA: Diagnosis not present

## 2022-11-13 ENCOUNTER — Ambulatory Visit: Payer: Medicare HMO | Admitting: Pharmacist

## 2022-12-08 DIAGNOSIS — E119 Type 2 diabetes mellitus without complications: Secondary | ICD-10-CM | POA: Diagnosis not present

## 2022-12-19 ENCOUNTER — Ambulatory Visit: Payer: Medicare HMO | Admitting: Internal Medicine

## 2022-12-24 ENCOUNTER — Encounter: Payer: Self-pay | Admitting: Internal Medicine

## 2023-01-07 DIAGNOSIS — E119 Type 2 diabetes mellitus without complications: Secondary | ICD-10-CM | POA: Diagnosis not present

## 2023-02-06 DIAGNOSIS — E119 Type 2 diabetes mellitus without complications: Secondary | ICD-10-CM | POA: Diagnosis not present

## 2023-03-08 DIAGNOSIS — E119 Type 2 diabetes mellitus without complications: Secondary | ICD-10-CM | POA: Diagnosis not present

## 2023-04-06 ENCOUNTER — Other Ambulatory Visit: Payer: Self-pay | Admitting: Student

## 2023-04-08 DIAGNOSIS — E119 Type 2 diabetes mellitus without complications: Secondary | ICD-10-CM | POA: Diagnosis not present

## 2023-05-08 DIAGNOSIS — E119 Type 2 diabetes mellitus without complications: Secondary | ICD-10-CM | POA: Diagnosis not present

## 2023-05-21 ENCOUNTER — Telehealth: Payer: Self-pay | Admitting: Internal Medicine

## 2023-05-21 MED ORDER — FLUTICASONE-SALMETEROL 250-50 MCG/ACT IN AEPB: 1.0000 | INHALATION_SPRAY | Freq: Two times a day (BID) | RESPIRATORY_TRACT | 2 refills | Status: AC

## 2023-05-21 NOTE — Telephone Encounter (Addendum)
Dr. Celine Mans, please see below message.

## 2023-05-21 NOTE — Telephone Encounter (Signed)
Wixela sent to pharmacy. Please advise her she is overdue for follow up and I'd like her to make an appt for further refills.

## 2023-05-22 NOTE — Telephone Encounter (Signed)
Lm for patient.

## 2023-05-23 NOTE — Telephone Encounter (Signed)
I called pt and there was no answer- LMTCB and closing per protocol.

## 2023-06-07 DIAGNOSIS — E119 Type 2 diabetes mellitus without complications: Secondary | ICD-10-CM | POA: Diagnosis not present

## 2023-06-09 ENCOUNTER — Other Ambulatory Visit: Payer: Self-pay | Admitting: Student

## 2023-06-13 ENCOUNTER — Encounter: Payer: Self-pay | Admitting: Pharmacist

## 2023-06-13 ENCOUNTER — Ambulatory Visit: Payer: Medicare HMO | Admitting: Pharmacist

## 2023-06-13 VITALS — BP 148/77 | HR 64 | Wt 186.6 lb

## 2023-06-13 DIAGNOSIS — E119 Type 2 diabetes mellitus without complications: Secondary | ICD-10-CM

## 2023-06-13 DIAGNOSIS — Z794 Long term (current) use of insulin: Secondary | ICD-10-CM | POA: Diagnosis not present

## 2023-06-13 NOTE — Patient Instructions (Addendum)
 It was nice to see you today! Keep up the great work and let us know if you have any questions!  Your goal blood sugar is 80-130 before eating and less than 180 after eating.  Medication Changes: START Humalog (insulin lispro) 2 units with your large meal (dinner)  Decrease Basaglar (insulin glargine) to 8-10 units  Discontinue metformin  Continue all other medication the same.   Keep up the good work with diet and exercise. Aim for a diet full of vegetables, fruit and lean meats (chicken, [place], fish). Try to limit salt intake by eating fresh or frozen vegetables (instead of canned), rinse canned vegetables prior to cooking and do not add any additional salt to meals.

## 2023-06-13 NOTE — Progress Notes (Signed)
 Subjective:    Patient ID: Jeanette Ellis, female    DOB: Mar 07, 1951, 73 y.o.   MRN: [id number]  HPI  Patient is a 73 y.o. female who presents for diabetes management and Libre 3 follow-up. She is in good spirits and presents without assistance. Patient was last seen by Primary Care Provider, Dr. Royal Piedra on 08/06/2022  PMH significant for T2DM, HLD, HTN, GERD.   Patient reports diabetes was diagnosed in the 41s.   Insurance coverage/medication affordability: Paramedic   Current diabetes medications include: Trulicity (dulaglutide) 3 mg weekly, metformin XR 500 mg, insulin glargine (Basaglar) 10-15 units daily,  Denies use of prescribed Humalog (insulin lispro). Current hypertension medications include: lisinopril 20 mg Current hyperlipidemia medications include: atorvastatin 20 mg Patient states that she is not taking her medications as prescribed. Patient denies adherence with medications. Patient states that she misses her medications occasionally.   Patient reported dietary habits:  Eats 2 meals/day Breakfast: minimal  Lunch:Light Dinner:Large meal, lots of carbs (enjoys bread and desserts)   Objective:   CGM report from last 14 days (05/30/23-06/12/2023)   Average Glucose 198  Glucose Meter Index (GMI) 8.0  Glucose Variability 34   Time in Range (TIR) %  Very High 23  High 33  Target 43  Low 1  Very Low 0    Labs:   Physical Exam Vitals reviewed.  Psychiatric:        Mood and Affect: Mood normal.        Behavior: Behavior normal.        Thought Content: Thought content normal.        Judgment: Judgment normal.     Review of Systems  All other systems reviewed and are negative.   Lab Results  Component Value Date   HGBA1C 10.0 (A) 08/06/2022   HGBA1C 8.1 (A) 01/26/2022   HGBA1C 7.9 (A) 08/29/2021    Lab Results  Component Value Date   MICRALBCREAT 14 08/06/2022    Lipid Panel     Component Value Date/Time   CHOL 162 08/06/2022 1724    TRIG 88 08/06/2022 1724   HDL 84 08/06/2022 1724   CHOLHDL 1.9 08/06/2022 1724   LDLCALC 62 08/06/2022 1724    Clinical Atherosclerotic Cardiovascular Disease (ASCVD): No   PHQ-9 Score: 7   Assessment/Plan:   T2DM with fair control however, medication adherence to previous plan was different than prescribed.  She continues to find use of CGM helpful in controlling her glucose.  She denies use of prandial insulin, Humalog (insulin lispro) due to episode of low symptoms following use in the morning with initial use (minimal food intake).  She continues to use basal insulin (Lantus (insulin glargine) 10-15 units (typically 10-12 units) daily. Patient reports GI intolerance with single metformin 500mg  dose and history of GI intolerance with rechallenge.  - Re-start insulin lispro (Humalog) 2 units once daily with largest meal of the day (evening). - Adjust insulin glargine (Basaglar) from 10-12 units daily to 8-10 units daily - D/C metformin and add to medication intolerance list due to diarrhea Reviewed hypoglycemia management.   ASCVD risk - primary prevention in patient with diabetes. Last LDL is controlled.  - Continued atorvastatin 20 mg.   Hypertension longstanding currently controlled.  Blood pressure goal = <140 mmHg SBP or lower if tolerated. Reports adherence to HTN agents.    Follow-up pharmacist appointment 06/27/2023 to review diabetes control. Written patient instructions provided. This appointment required 26 minutes of patient care (this includes  precharting, chart review, review of results, and face-to-face care).  Patient seen with Threasa Heads, PharmD Candidate and Mack Guise, PharmD Candidate.

## 2023-06-13 NOTE — Assessment & Plan Note (Signed)
  T2DM with fair control however, medication adherence to previous plan was different than prescribed.  She continues to find use of CGM helpful in controlling her glucose.  She denies use of prandial insulin, Humalog (insulin lispro) due to episode of low symptoms following use in the morning with initial use (minimal food intake).  She continues to use basal insulin (Lantus (insulin glargine) 10-15 units (typically 10-12 units) daily. Patient reports GI intolerance with single metformin 500mg  dose and history of GI intolerance with rechallenge.  - Re-start insulin lispro (Humalog) 2 units once daily with largest meal of the day (evening). - Adjust insulin glargine (Basaglar) from 10-12 units daily to 8-10 units daily - D/C metformin and add to medication intolerance list due to diarrhea Reviewed hypoglycemia management.

## 2023-06-17 NOTE — Progress Notes (Signed)
 Reviewed and agree with Dr Macky Lower plan.

## 2023-06-27 ENCOUNTER — Ambulatory Visit: Admitting: Pharmacist

## 2023-07-04 ENCOUNTER — Encounter: Payer: Self-pay | Admitting: Pharmacist

## 2023-07-04 ENCOUNTER — Ambulatory Visit: Admitting: Pharmacist

## 2023-07-04 VITALS — BP 128/64 | HR 78 | Wt 179.8 lb

## 2023-07-04 DIAGNOSIS — E119 Type 2 diabetes mellitus without complications: Secondary | ICD-10-CM

## 2023-07-04 DIAGNOSIS — Z794 Long term (current) use of insulin: Secondary | ICD-10-CM

## 2023-07-04 NOTE — Progress Notes (Signed)
 S:     Chief Complaint  Patient presents with   Medication Management    Diabetes Follow up   73 y.o. female who presents for diabetes evaluation, education, and management. Patient arrives not feeling well from recent infection.  Patient was seen by me on 06/13/23 and last seen by PCP, Dr. Royal Piedra, on 08/06/2023 PMH is significant for HTN, HLD, T2DM At last visit, Humalog (insulin lispro) was started twice a day and Basaglar (insulin glargine) was decreased to  8-10 units. Metformin was discontinued as well. Patient reported having a headache when taking the Humalog (insulin lispro) so today we encouraged her to decrease to once daily 15 minutes before her large meal. Since her last visit she had an upper respiratory infection for which she was given prednisone.   Current diabetes medications include: Trulicity (dulaglutide) 3 mg weekly, Basaglar (insulin glargine) 10 units daily Current hypertension medications include: lisinopril 20 mg daily Current hyperlipidemia medications include: atorvastatin 20 mg daily  Patient reports adherence to taking all medications as prescribed.   Insurance coverage: [employer] Medicare  PHQ-9 Score: 7  Patient-reported exercise habits: Reported that exercise has been difficult over the past couple of weeks due to sickness. Encouraged her to resume walking once she feels better. We also discussed hydration and avoiding drinking too much before bedtime.   O:   Review of Systems  Constitutional:  Positive for malaise/fatigue.    Physical Exam Constitutional:      Appearance: Normal appearance.  Pulmonary:     Effort: Pulmonary effort is normal.  Neurological:     Mental Status: She is alert.  Psychiatric:        Mood and Affect: Mood normal.        Thought Content: Thought content normal.        Judgment: Judgment normal.     7 day average blood glucose: 240  Libre3 CGM Download today 06/21/23-07/04/23 % Time CGM is active: 97% Average  Glucose: 240 mg/dL Glucose Management Indicator: 9.1  Glucose Variability: 34.5% (goal <36%) Time in Goal:  - Time in range 70-180: 29% - Time above range: 71% - Time below range: 0% Observed patterns: Higher towards the end of the day (her large meal). Discussed glycemic index and how different foods can alter blood sugar.   Lab Results  Component Value Date   HGBA1C 10.0 (A) 08/06/2022   Vitals:   07/04/23 1536  BP: 128/64  Pulse: 78  SpO2: 100%    Lipid Panel     Component Value Date/Time   CHOL 162 08/06/2022 1724   TRIG 88 08/06/2022 1724   HDL 84 08/06/2022 1724   CHOLHDL 1.9 08/06/2022 1724   LDLCALC 62 08/06/2022 1724    Clinical Atherosclerotic Cardiovascular Disease (ASCVD): No  The 10-year ASCVD risk score (Arnett DK, et al., 2019) is: 32.1%   Values used to calculate the score:     Age: 5 years     Sex: Female     Is Non-Hispanic African American: Yes     Diabetic: Yes     Tobacco smoker: No     Systolic Blood Pressure: 128 mmHg     Is BP treated: Yes     HDL Cholesterol: 84 mg/dL     Total Cholesterol: 162 mg/dL    A/P: Diabetes longstanding currently improved control overall control despite recent prednisone burst during the last week for respiratory illness. Patient is  able to verbalize appropriate hypoglycemia management plan. Medication appears adherent.  -  Continued basal insulin Basaglar (insulin glargine) 10 units in the morning -Reduced rapid insulin  Humalog (insulin lispro) to one dose daily, 2 units before her largest meal -Continued GLP-1 Trulicity (dulaglutide) 3 mg weekly.   -Patient educated on purpose, proper use, and discussed that if headaches persist with Humalog (insulin lispro) to give [place] a call on Monday.  -Extensively discussed pathophysiology of diabetes, recommended lifestyle interventions, dietary effects on blood sugar control.  -Counseled on s/sx of and management of hypoglycemia.  -Next A1c anticipated at next visit.    ASCVD risk - primary prevention in patient with diabetes. Last LDL is at goal of <70 mg/dL. ASCVD risk factors include HTN, HLD, T2DM and 10-year ASCVD risk score of 33.7%. moderate intensity statin indicated.  -Continued atorvastatin 20 mg.   Hypertension longstanding currently controlled. Blood pressure goal of <130/80 mmHg. Medication adherence is adherent.  -Continued lisinopril 20 mg.  Written patient instructions provided. Patient verbalized understanding of treatment plan.  Total time in face to face counseling 42 minutes.    Follow-up:  Pharmacist 07/18/2023 Patient seen with Threasa Heads, PharmD Candidate and Mack Guise, PharmD Candidate.

## 2023-07-04 NOTE — Assessment & Plan Note (Signed)
 Diabetes longstanding currently improved control overall control despite recent prednisone burst during the last week for respiratory illness. Patient is  able to verbalize appropriate hypoglycemia management plan. Medication appears adherent.  -Continued basal insulin Basaglar (insulin glargine) 10 units in the morning -Reduced rapid insulin  Humalog (insulin lispro) to one dose daily, 2 units before her largest meal -Continued GLP-1 Trulicity (dulaglutide) 3 mg weekly.   -Patient educated on purpose, proper use, and discussed that if headaches persist with Humalog (insulin lispro) to give [place] a call on Monday.  -Extensively discussed pathophysiology of diabetes, recommended lifestyle interventions, dietary effects on blood sugar control.  -Counseled on s/sx of and management of hypoglycemia.  -Next A1c anticipated at next visit.

## 2023-07-04 NOTE — Patient Instructions (Signed)
 It was nice to see you today! Hope you feel better from the infection.  Your goal blood sugar is 80-130 before eating and less than 180 after eating.  Medication Changes:  Decrease Humalog to 2 units before largest meal of the day. Take the medication 15 minutes before that meal.   Continue all other medication the same.    Keep up the good work with diet and exercise. Aim for a diet full of vegetables, fruit and lean meats (chicken, [place], fish). Try to limit salt intake by eating fresh or frozen vegetables (instead of canned), rinse canned vegetables prior to cooking and do not add any additional salt to meals.

## 2023-07-05 NOTE — Progress Notes (Signed)
 Reviewed and agree with Dr Macky Lower plan.

## 2023-07-11 ENCOUNTER — Telehealth: Payer: Self-pay | Admitting: Pharmacist

## 2023-07-11 NOTE — Telephone Encounter (Signed)
 Patient returned call.    We discussed her control on CGM  She admitted missing evening dinner (mealtime) insulin dose yesterday BUT overall since she has been using basal / bolus regimen she has much improved control without low readings.  - No change in treatment plan.   Visit with me in 1 week.  Additional review at that time.

## 2023-07-11 NOTE — Telephone Encounter (Signed)
 Attempted to contact patient for follow-up of glucose control.  Review of CGM appears to continue with elevated readings at mealtime.  Called to discuss small dose increase with largest meal of the day.    Left HIPAA compliant voice mail requesting call back to direct phone: [phone]  Total time with patient call and documentation of interaction: 6 minutes.  No additional phone call follow-up planned  Visit scheduled in 7 days

## 2023-07-12 NOTE — Telephone Encounter (Signed)
 Reviewed and agree with Dr Macky Lower plan.

## 2023-07-18 ENCOUNTER — Ambulatory Visit: Admitting: Pharmacist

## 2023-07-18 ENCOUNTER — Encounter: Payer: Self-pay | Admitting: Pharmacist

## 2023-07-18 VITALS — BP 137/72 | HR 72 | Wt 183.4 lb

## 2023-07-18 DIAGNOSIS — Z794 Long term (current) use of insulin: Secondary | ICD-10-CM

## 2023-07-18 DIAGNOSIS — E119 Type 2 diabetes mellitus without complications: Secondary | ICD-10-CM | POA: Diagnosis not present

## 2023-07-18 NOTE — Progress Notes (Signed)
 S:     Chief Complaint  Patient presents with   Medication Management    Diabetes - insulin adjustment   73 y.o. female who presents for diabetes evaluation, education, and management. Patient arrives in good spirits and presents without any assistance.   Patient was referred and last seen by Primary Care Provider, Dr. Royal Piedra, on 08/06/22.   PMH is significant for T2DM, HTN, HLD, sarcoidosis.  At last visit, discussed diabetes medication management with Dr. Royal Piedra.   Patient reports Diabetes was diagnosed in 2015.   Current diabetes medications include: Insulin glargine 10 units once daily, insulin lispro (2 units at dinner), dulaglutide - Taking one meal-time insulin dose a day Current hypertension medications include: lisinopril Current hyperlipidemia medications include: atorvastatin  Patient denies adherence with medications, reports missing second insulin lispro dose daily, second dose of Advair, and occasionally atorvastatin, on average.  Do you feel that your medications are working for you? no Have you been experiencing any side effects to the medications prescribed? yes Do you have any problems obtaining medications due to transportation or finances? no Insurance coverage: [employer]  Patient reports hypoglycemic events. On occasion overnight/early morning.  Patient reported dietary habits: Eats 3 meals/day Breakfast: egg & muffin or breakfast bar Lunch: sandwich or small pastry with meat Dinner: usually biggest meal of the day, most likely high carb Snacks: ice cream Drinks: coffee, water, sugar free or regular soda  Patient-reported exercise habits: has not been regularly walking over the past year, wants to start walking again   O:   Libre3 CGM Download today 07/18/23 % Time CGM is active: 96% Average Glucose: 214 mg/dL Glucose Management Indicator: 8.4  Glucose Variability: 39.1% (goal <36%) Time in Goal:  - Time in range 70-180: 39% - Time  above range: 61% - Time below range: 0% Observed patterns:normal/low morning and high afternoon/evening   Lab Results  Component Value Date   HGBA1C 10.0 (A) 08/06/2022   Vitals:   07/18/23 1544  BP: 137/72  Pulse: 72  SpO2: 100%    Lipid Panel     Component Value Date/Time   CHOL 162 08/06/2022 1724   TRIG 88 08/06/2022 1724   HDL 84 08/06/2022 1724   CHOLHDL 1.9 08/06/2022 1724   LDLCALC 62 08/06/2022 1724    Clinical Atherosclerotic Cardiovascular Disease (ASCVD): No  The 10-year ASCVD risk score (Arnett DK, et al., 2019) is: 34.9%   Values used to calculate the score:     Age: 89 years     Sex: Female     Is Non-Hispanic African American: Yes     Diabetic: Yes     Tobacco smoker: No     Systolic Blood Pressure: 137 mmHg     Is BP treated: Yes     HDL Cholesterol: 84 mg/dL     Total Cholesterol: 162 mg/dL    A/P: Diabetes longstanding currently uncontrolled on insulin lispro and glargine.  GMI shows progress toward improved control.  Patient is able to verbalize appropriate hypoglycemia management plan. Medication adherence appears to be nonadherent. Control is suboptimal due to missed doses of lunchtime insulin and high carb meals in the evenings. -Decreased dose of basal insulin Basaglar (insulin glargine) from 10 units to 7 units daily in the morning.  -Adjusted dose of rapid insulin Humalog (insulin lispro) to 2 units at lunchtime and 2-3 units at dinnertime.  -Extensively discussed pathophysiology of diabetes, recommended lifestyle interventions, dietary effects on blood sugar control.  -Discussed cutting back  on carb-heavy dinners to weekends, patient agreeable to plan. -Patient stated that she will start walking again in the afternoon. -Counseled on s/sx of and management of hypoglycemia.  -Next A1c anticipated at next visit.   Hypertension longstanding currently controlled. Blood pressure goal of <140 mmHg systolic. Medication adherence is good.   -Continued lisinopril 20 mg.  Written patient instructions provided. Patient verbalized understanding of treatment plan.  Total time in face to face counseling 37 minutes.    Follow-up:  Pharmacist 08/15/2023 PCP clinic visit as needed Patient seen with Darolyn Rua, PharmD Candidate.

## 2023-07-18 NOTE — Assessment & Plan Note (Signed)
 Diabetes longstanding currently uncontrolled on insulin lispro and glargine.  GMI shows progress toward improved control.  Patient is able to verbalize appropriate hypoglycemia management plan. Medication adherence appears to be nonadherent. Control is suboptimal due to missed doses of lunchtime insulin and high carb meals in the evenings. -Decreased dose of basal insulin Basaglar (insulin glargine) from 10 units to 7 units daily in the morning.  -Adjusted dose of rapid insulin Humalog (insulin lispro) to 2 units at lunchtime and 2-3 units at dinnertime.  -Extensively discussed pathophysiology of diabetes, recommended lifestyle interventions, dietary effects on blood sugar control.  -Discussed cutting back on carb-heavy dinners to weekends, patient agreeable to plan. -Patient stated that she will start walking again in the afternoon.

## 2023-07-18 NOTE — Patient Instructions (Signed)
 It was nice to see you today!  Your goal blood sugar is 80-130 before eating and less than 180 after eating.  Medication Changes:  Increase insulin lispro 2 units at lunchtime, then 2-3 units at dinner. Insulin pens can stay up to 28 days outside of the refrigerator.   Decrease insulin glargine to 7 units daily in the morning.  Continue all other medication the same.   Monitor blood sugars at home and keep a log (glucometer or piece of paper) to bring with you to your next visit.  Keep up the good work with diet and exercise. Aim for a diet full of vegetables, fruit and lean meats (chicken, [place], fish). Try to limit salt intake by eating fresh or frozen vegetables (instead of canned), rinse canned vegetables prior to cooking and do not add any additional salt to meals.

## 2023-07-19 NOTE — Progress Notes (Signed)
 Reviewed and agree with Dr Macky Lower plan.

## 2023-08-01 DIAGNOSIS — Z1231 Encounter for screening mammogram for malignant neoplasm of breast: Secondary | ICD-10-CM | POA: Diagnosis not present

## 2023-08-01 LAB — HM MAMMOGRAPHY

## 2023-08-05 ENCOUNTER — Telehealth: Payer: Self-pay | Admitting: Pharmacist

## 2023-08-05 NOTE — Telephone Encounter (Signed)
 Attempted to contact patient for follow-up of Medication Adherence and overall glucose control   Patient was flagged for Medication adherence issue with metformin .  However at visit on 06/13/2023 patient medication intolerance list was updated.  Metformin  added to allergy /intolerance list and stopped due to ongoing issue with diarrhea due to metformin .   Review of CGM during review reveals suboptimal glucose control. Asked patient to return call to discuss current regimen.  Avg glucose 240   GMI 9.1  Left HIPAA compliant voice mail requesting call back to direct phone: [phone]  Total time with patient call and documentation of interaction: 11 minutes.  Follow-up phone call planned: 2-3 days.

## 2023-08-09 DIAGNOSIS — E119 Type 2 diabetes mellitus without complications: Secondary | ICD-10-CM | POA: Diagnosis not present

## 2023-08-13 ENCOUNTER — Encounter: Admitting: Student

## 2023-08-15 ENCOUNTER — Ambulatory Visit: Admitting: Pharmacist

## 2023-08-15 ENCOUNTER — Encounter: Payer: Self-pay | Admitting: Pharmacist

## 2023-08-15 VITALS — BP 155/80 | HR 73 | Wt 183.2 lb

## 2023-08-15 DIAGNOSIS — E119 Type 2 diabetes mellitus without complications: Secondary | ICD-10-CM | POA: Diagnosis not present

## 2023-08-15 DIAGNOSIS — Z794 Long term (current) use of insulin: Secondary | ICD-10-CM | POA: Diagnosis not present

## 2023-08-15 NOTE — Progress Notes (Signed)
 S:     Chief Complaint  Patient presents with   Medication Management    Diabetes follow-up   73 y.o. female who presents for diabetes evaluation, education, and management. Patient arrives in good spirits and presents without any assistance.   Patient was referred and last seen by Primary Care Provider, Dr. Janae Mclean, on 08/06/2022.   PMH is significant for osteoarthritis, HTN, HLD.  Patient reports Diabetes was diagnosed in 2015.   Current diabetes medications include: Basaglar  (insulin  glargine) 8 units daily, Humalog  (insulin  lispro) 4 units BID  Current hypertension medications include: lisinopril  20 mg daily  Current hyperlipidemia medications include: atorvastatin  20 mg daily. Patient was initially concerned that atorvastatin  may be contributing to hip OA, but was counseled on difference in muscle pain and OA pain.   Patient reports adherence to taking all medications as prescribed, but has missed taking her blood pressure medication the past few days since she has been 'focusing on her sugars' recently.   Do you feel that your medications are working for you? yes Have you been experiencing any side effects to the medications prescribed? no Do you have any problems obtaining medications due to transportation or finances? no Insurance coverage: [employer]   Patient denies hypoglycemic events and denies nocturia (nighttime urination).   Patient reported dietary habits: Eats 2 large meals/day. Has noticed which food have large impact on sugars, like sausage biscuit and has been working to avoid them. Has focused on eating more green vegetables recently. Counseled on effects of different food groups on appetite, with proteins providing longer-lasting fullness.   Patient-reported exercise habits: Has been walking more on treadmill recently. Encouraged her to aim for 150 minutes/week of walking as a starting point.   O:   Review of Systems  All other systems reviewed and  are negative.  Physical Exam Constitutional:      Appearance: Normal appearance.  Pulmonary:     Effort: Pulmonary effort is normal.  Neurological:     Mental Status: She is alert.  Psychiatric:        Mood and Affect: Mood normal.        Behavior: Behavior normal.        Thought Content: Thought content normal.        Judgment: Judgment normal.    Libre3 CGM Download today 08/02/23 - 08/15/23  % Time CGM is active: 88% Average Glucose: 258 mg/dL Glucose Management Indicator: 9.5  Glucose Variability: 34.3% (goal <36%) Time in Goal:  - Time in range 70-180: 23% - Time above range: 77% (51% very high)  - Time below range: 0% Observed patterns:Improved over past couple of days when she started taking prandial insulin  BID. See highest spike in the evening.   Lab Results  Component Value Date   HGBA1C 10.0 (A) 08/06/2022   Vitals:   08/15/23 1550 08/15/23 1551  BP: (!) 153/92 (!) 155/80  Pulse: 73     Lipid Panel     Component Value Date/Time   CHOL 162 08/06/2022 1724   TRIG 88 08/06/2022 1724   HDL 84 08/06/2022 1724   CHOLHDL 1.9 08/06/2022 1724   LDLCALC 62 08/06/2022 1724    Clinical Atherosclerotic Cardiovascular Disease (ASCVD): No  The 10-year ASCVD risk score (Arnett DK, et al., 2019) is: 40.4%   Values used to calculate the score:     Age: 10 years     Sex: Female     Is Non-Hispanic African American: Yes  Diabetic: Yes     Tobacco smoker: No     Systolic Blood Pressure: 155 mmHg     Is BP treated: Yes     HDL Cholesterol: 84 mg/dL     Total Cholesterol: 162 mg/dL   A/P: Diabetes longstanding since 2015 (10 years) currently uncontrolled.  Medication adherence appears to have improved recently with use of prandial dosing multiple times per day.  Control is suboptimal due to inadequate insulin  use. - Increase Humalog  (insulin  lispro) to 4 - 5 units BID with meals. Continue Basaglar  (insulin  glargine) as currently prescribed.  -Next A1c anticipated  early August.   ASCVD risk - primary prevention in patient with diabetes. Last LDL is 62, at goal of <70 mg/dL. ASCVD risk factors include HTN, T2DM, and 10-year ASCVD risk score of 40.4% (based on BP reading from poor lisinopril  adherence). moderate intensity statin indicated.  - Continue atorvastatin  20 mg daily.   Hypertension currently uncontrolled due to poor lisinopril  adherence. Blood pressure goal of <130/80 mmHg. Medication adherence poor, but was counseled on importance of controlling BP to lower risk of heart attack and stroke.  -Continue lisinopril  20 mg daily.   Written patient instructions provided. Patient verbalized understanding of treatment plan.  Total time in face to face counseling 34 minutes.    Follow-up:  Pharmacist on 6/19.  PCP clinic visit on 5/15.  Patient seen with Noah [place], PharmD Candidate and Volney Grumbles, PharmD, PGY-1 pharmacy resident.

## 2023-08-15 NOTE — Assessment & Plan Note (Signed)
 Diabetes longstanding since 2015 (10 years) currently uncontrolled.  Medication adherence appears to have improved recently with use of prandial dosing multiple times per day.  Control is suboptimal due to inadequate insulin  use. - Increase Humalog  (insulin  lispro) to 4 - 5 units BID with meals. Continue Basaglar  (insulin  glargine) as currently prescribed.

## 2023-08-15 NOTE — Patient Instructions (Addendum)
 It was nice to see you today!  Your goal blood sugar is 80-130 before eating and less than 180 after eating.  Medication Changes: Increase Humalog  (insulin  lispro) dose to 4 - 5 units twice daily with meals.   Continue using FreeStyle Sensors to note how different foods impact sugar levels.   Try taking one of the Tylenol  Arthritis tablets (650 mg) twice daily for osteoarthritis.   Continue taking all your other medication the same.   Keep up the good work with diet and exercise. Aim for a diet full of vegetables, fruit and lean meats (chicken, [place], fish). Try to limit salt intake by eating fresh or frozen vegetables (instead of canned), rinse canned vegetables prior to cooking and do not add any additional salt to meals.

## 2023-08-19 ENCOUNTER — Encounter: Payer: Self-pay | Admitting: Pharmacist

## 2023-08-19 NOTE — Progress Notes (Signed)
 Patient chart reviewed for Medication Adherence Review  - MAD - Metformin .   At patient care visit 06/13/2023, Metformin  D/C due to intolerance and added to allergry/intolerance list in chart.    -No Medication adherence issue with metformin  as medication has been discontinued.   Total time with patient call and documentation of interaction: 8 minutes.

## 2023-08-21 NOTE — Progress Notes (Signed)
 Reviewed and agree with Dr Macky Lower plan.

## 2023-08-22 ENCOUNTER — Encounter: Admitting: Student

## 2023-08-22 NOTE — Progress Notes (Deleted)
  SUBJECTIVE:   CHIEF COMPLAINT / HPI:   Type 2 Diabetes: Home medications include: ***. {Blank single:19197::"Does","Does not"} endorse compliance. Home glucose monitoring {home testing:315145}. Patient {rwisisnot:24883} up to date on diabetic eye.  Hypertension: Home medications include: ***. She {Blank single:19197::"endorses","does not endorse"} taking these medications as prescribed. {Blank single:19197::"Does","Does not"} check blood pressure at home. Patient {HAS HAS WUJ:81191} had a BMP in the past 1 year. Most recent creatinine trend:  Lab Results  Component Value Date   CREATININE 0.90 08/06/2022   CREATININE 0.91 08/29/2021   CREATININE 1.04 (H) 01/02/2021     PERTINENT  PMH / PSH: HTN, HLD, T2DM, GERD,   OBJECTIVE:  There were no vitals taken for this visit. ***  ASSESSMENT/PLAN:   Assessment & Plan  No follow-ups on file. Veronia Goon, DO 08/22/2023, 10:34 AM PGY-3, [facility] {    This will disappear when note is signed, click to select method of visit    :1}

## 2023-09-06 ENCOUNTER — Encounter: Payer: Self-pay | Admitting: Student

## 2023-09-06 ENCOUNTER — Ambulatory Visit (INDEPENDENT_AMBULATORY_CARE_PROVIDER_SITE_OTHER): Admitting: Student

## 2023-09-06 VITALS — BP 132/67 | HR 78 | Ht 67.0 in | Wt 186.0 lb

## 2023-09-06 DIAGNOSIS — E119 Type 2 diabetes mellitus without complications: Secondary | ICD-10-CM | POA: Diagnosis not present

## 2023-09-06 DIAGNOSIS — E782 Mixed hyperlipidemia: Secondary | ICD-10-CM

## 2023-09-06 DIAGNOSIS — I10 Essential (primary) hypertension: Secondary | ICD-10-CM

## 2023-09-06 DIAGNOSIS — E1129 Type 2 diabetes mellitus with other diabetic kidney complication: Secondary | ICD-10-CM | POA: Diagnosis not present

## 2023-09-06 DIAGNOSIS — B351 Tinea unguium: Secondary | ICD-10-CM

## 2023-09-06 DIAGNOSIS — Z794 Long term (current) use of insulin: Secondary | ICD-10-CM | POA: Diagnosis not present

## 2023-09-06 DIAGNOSIS — R809 Proteinuria, unspecified: Secondary | ICD-10-CM | POA: Diagnosis not present

## 2023-09-06 LAB — POCT GLYCOSYLATED HEMOGLOBIN (HGB A1C): HbA1c, POC (controlled diabetic range): 9.1 % — AB (ref 0.0–7.0)

## 2023-09-06 MED ORDER — BASAGLAR KWIKPEN 100 UNIT/ML ~~LOC~~ SOPN: 10.0000 [IU] | PEN_INJECTOR | Freq: Every day | SUBCUTANEOUS | Status: DC

## 2023-09-06 NOTE — Progress Notes (Signed)
  SUBJECTIVE:   CHIEF COMPLAINT / HPI:   Type 2 Diabetes: Home medications include: Humalog  4-5 units twice daily with meals but sometimes misses doses, glargine 8 units daily, Dulaglutide  3mg  weekly. Does endorse compliance. Home glucose monitoring is performed regularly with CGM. Patient is not up to date on diabetic eye.  Hypertension: Home medications include: Lisinopril  20 mg daily. She endorses taking these medications as prescribed. Patient has not had a BMP in the past 1 year.  PERTINENT  PMH / PSH: T2DM, HLD, HTN, GERD, sarcoidosis, diabetic retinopathy  OBJECTIVE:  BP 132/67   Pulse 78   Ht 5\' 7"  (1.702 m)   Wt 186 lb (84.4 kg)   SpO2 98%   BMI 29.13 kg/m  General: Well-appearing, NAD CV: RRR, murmurs appreciable Pulm: CTAB, normal WOB  ASSESSMENT/PLAN:   Assessment & Plan Type 2 diabetes mellitus without complication, with long-term current use of insulin  (HCC) A1c 9.1, poorly controlled.  Reviewed libre view report.  Increase glargine to 10 units daily, she will need to be a little more consistent with her Humalog  4 to 5 units twice daily.  Continue dulaglutide  3 mg weekly although consider transition to tirzepatide for better control. - Advised patient to schedule appointment with ophthalmology for diabetic eye exam -Referral to podiatry for diabetic foot exam - Follow-up with Dr. Koval in a couple weeks Essential hypertension BP: 132/67 today. Well controlled. Goal of <140. Medication regimen: lisinopril  20mg  daily.  Obtain BMP for kidney function Microalbuminuria due to type 2 diabetes mellitus (HCC) Obtain microalbumin/creatinine urine ratio.  Continue lisinopril  for renal protection. Mixed hyperlipidemia Obtain lipid panel today, titrate atorvastatin  accordingly. Onychomycosis Referral to podiatry. Return in about 20 days (around 09/26/2023). Veronia Goon, DO 09/06/2023, 3:43 PM PGY-3, [facility]

## 2023-09-06 NOTE — Assessment & Plan Note (Signed)
 Obtain lipid panel today, titrate atorvastatin  accordingly.

## 2023-09-06 NOTE — Addendum Note (Signed)
 Addended by: Iantha Mainland on: 09/06/2023 04:04 PM   Modules accepted: Orders

## 2023-09-06 NOTE — Assessment & Plan Note (Addendum)
 BP: 132/67 today. Well controlled. Goal of <140. Medication regimen: lisinopril  20mg  daily.  Obtain BMP for kidney function

## 2023-09-06 NOTE — Assessment & Plan Note (Addendum)
 A1c 9.1, poorly controlled.  Reviewed libre view report.  Increase glargine to 10 units daily, she will need to be a little more consistent with her Humalog  4 to 5 units twice daily.  Continue dulaglutide  3 mg weekly although consider transition to tirzepatide for better control. - Advised patient to schedule appointment with ophthalmology for diabetic eye exam -Referral to podiatry for diabetic foot exam - Follow-up with Dr. Koval in a couple weeks

## 2023-09-06 NOTE — Patient Instructions (Addendum)
 It was great to see you today! Thank you for choosing [facility] for your primary care.  Today we addressed: Your A1c is 9.1.  I recommend changing your glargine to 10 units daily.  Please try to take your Humalog  twice a day as prescribed.  You have appointment with Dr. Koval in a couple weeks.  I have submitted a referral to podiatry for your toenails and foot exam.  Please call your ophthalmologist for diabetic eye exam.  If you haven't already, sign up for My Chart to have easy access to your labs results, and communication with your primary care physician. We are checking some labs today. If they are abnormal, I will call you. If they are normal, I will send you a MyChart message (if it is active) or a letter in the mail. If you do not hear about your labs in the next 2 weeks, please call the office. Return in about 20 days (around 09/26/2023). Please arrive 15 minutes before your appointment to ensure smooth check in process.  We appreciate your efforts in making this happen.  Thank you for allowing me to participate in your care, Veronia Goon, DO 09/06/2023, 3:37 PM PGY-3, [facility]

## 2023-09-06 NOTE — Assessment & Plan Note (Signed)
 Obtain microalbumin/creatinine urine ratio.  Continue lisinopril  for renal protection.

## 2023-09-06 NOTE — Assessment & Plan Note (Signed)
 Referral to podiatry

## 2023-09-07 LAB — LIPID PANEL
Chol/HDL Ratio: 2.2 ratio (ref 0.0–4.4)
Cholesterol, Total: 173 mg/dL (ref 100–199)
HDL: 80 mg/dL (ref >39)
LDL Chol Calc (NIH): 63 mg/dL (ref 0–99)
Triglycerides: 187 mg/dL — ABNORMAL HIGH (ref 0–149)
VLDL Cholesterol Cal: 30 mg/dL (ref 5–40)

## 2023-09-07 LAB — MICROALBUMIN / CREATININE URINE RATIO
Creatinine, Urine: 63.9 mg/dL
Microalb/Creat Ratio: 15 mg/g{creat} (ref 0–29)
Microalbumin, Urine: 9.4 ug/mL

## 2023-09-07 LAB — BASIC METABOLIC PANEL WITH GFR
BUN/Creatinine Ratio: 20 (ref 12–28)
BUN: 16 mg/dL (ref 8–27)
CO2: 25 mmol/L (ref 20–29)
Calcium: 9.3 mg/dL (ref 8.7–10.3)
Chloride: 100 mmol/L (ref 96–106)
Creatinine, Ser: 0.81 mg/dL (ref 0.57–1.00)
Glucose: 225 mg/dL — ABNORMAL HIGH (ref 70–99)
Potassium: 4.1 mmol/L (ref 3.5–5.2)
Sodium: 141 mmol/L (ref 134–144)
eGFR: 76 mL/min/{1.73_m2} (ref >59)

## 2023-09-09 ENCOUNTER — Ambulatory Visit: Payer: Self-pay | Admitting: Student

## 2023-09-17 ENCOUNTER — Encounter: Payer: Self-pay | Admitting: *Deleted

## 2023-09-26 ENCOUNTER — Ambulatory Visit: Admitting: Pharmacist

## 2023-09-27 ENCOUNTER — Encounter: Payer: Self-pay | Admitting: Pharmacist

## 2023-10-07 ENCOUNTER — Other Ambulatory Visit: Payer: Self-pay | Admitting: Student

## 2023-10-07 DIAGNOSIS — E785 Hyperlipidemia, unspecified: Secondary | ICD-10-CM

## 2023-10-07 DIAGNOSIS — I10 Essential (primary) hypertension: Secondary | ICD-10-CM

## 2023-10-14 ENCOUNTER — Telehealth: Payer: Self-pay | Admitting: Pharmacist

## 2023-10-14 ENCOUNTER — Other Ambulatory Visit: Payer: Self-pay | Admitting: Family Medicine

## 2023-10-14 DIAGNOSIS — E119 Type 2 diabetes mellitus without complications: Secondary | ICD-10-CM

## 2023-10-14 NOTE — Telephone Encounter (Signed)
 Attempted to contact patient for follow-up of glucose control and missed appointment without reschedule.   Review of CGM appears that patient has GMI of 7.9 (much improved) while also having a few nocturnal lows.  Likely need to re-balance basal/bolus regimen and possibly reduce long-acting basal insulin .   Left HIPAA compliant voice mail requesting call back to direct phone: [phone]  Total time with patient call and documentation of interaction: 5 minutes.  Follow-up phone call planned: None at this time.

## 2023-10-14 NOTE — Telephone Encounter (Signed)
 Reviewed and agree with Dr Macky Lower plan.

## 2023-10-15 NOTE — Telephone Encounter (Signed)
 Chart reviewed. Rx refilled.

## 2023-11-25 ENCOUNTER — Telehealth: Payer: Self-pay | Admitting: Pharmacist

## 2023-11-25 NOTE — Telephone Encounter (Signed)
 Reviewed and agree with Dr Rennis plan.

## 2023-11-25 NOTE — Telephone Encounter (Signed)
 Patient contacts office to report erratic readings on CGM since starting new sensor yesterday.   Discussed use of finger stick to verify readings for the next 24 hours.  If readings on CGM are dissimilar to finger sticks by > 100, patient advised to replace sensor tomorrow.    Total time with patient call and documentation of interaction: 8 minutes.

## 2023-12-05 ENCOUNTER — Other Ambulatory Visit: Payer: Self-pay

## 2023-12-05 DIAGNOSIS — Z794 Long term (current) use of insulin: Secondary | ICD-10-CM

## 2023-12-05 MED ORDER — BASAGLAR KWIKPEN 100 UNIT/ML ~~LOC~~ SOPN
10.0000 [IU] | PEN_INJECTOR | Freq: Every day | SUBCUTANEOUS | 1 refills | Status: DC
Start: 2023-12-05 — End: 2024-02-28

## 2023-12-17 DIAGNOSIS — Z008 Encounter for other general examination: Secondary | ICD-10-CM | POA: Diagnosis not present

## 2024-01-07 ENCOUNTER — Other Ambulatory Visit: Payer: Self-pay

## 2024-01-07 DIAGNOSIS — E785 Hyperlipidemia, unspecified: Secondary | ICD-10-CM

## 2024-01-07 DIAGNOSIS — I10 Essential (primary) hypertension: Secondary | ICD-10-CM

## 2024-01-07 MED ORDER — ATORVASTATIN CALCIUM 20 MG PO TABS: 20.0000 mg | ORAL_TABLET | Freq: Every day | ORAL | 0 refills | Status: DC

## 2024-01-07 MED ORDER — LISINOPRIL 20 MG PO TABS: 20.0000 mg | ORAL_TABLET | Freq: Every day | ORAL | 0 refills | Status: DC

## 2024-01-07 NOTE — Telephone Encounter (Signed)
 Please call patient to set up an appointment for her diabetes management!  Thanks, Kathrine Melena, DO

## 2024-01-07 NOTE — Progress Notes (Signed)
 Jeanette Ellis                                          MRN: [id number]   01/07/2024   The [facility]BCI Quality Team Specialist reviewed this patient medical record for the purposes of chart review for care gap closure. The following were reviewed: chart review for care gap closure-glycemic status assessment.    VBCI Quality Team

## 2024-01-10 ENCOUNTER — Telehealth: Payer: Self-pay | Admitting: Family Medicine

## 2024-01-10 NOTE — Telephone Encounter (Signed)
 Patient was identified as falling into the [place] Measure - Diabetes.   Patient was: Left voicemail to schedule with primary care provider.

## 2024-01-23 ENCOUNTER — Telehealth: Payer: Self-pay | Admitting: Pharmacist

## 2024-01-23 NOTE — Telephone Encounter (Signed)
 Attempted to contact patient for follow-up of Diabetes QI follow-up - need for repeat A1C Last was > 9 in 08/2023   Left HIPAA compliant voice mail requesting call back to direct phone: [phone]  Total time with patient call and documentation of interaction: 4 minutes.

## 2024-02-03 NOTE — Progress Notes (Signed)
 Jeanette Ellis                                          MRN: [id number]   02/03/2024   The VBCI Quality Team Specialist reviewed this patient medical record for the purposes of chart review for care gap closure. The following were reviewed: chart review for care gap closure-glycemic status assessment.    VBCI Quality Team

## 2024-02-07 ENCOUNTER — Telehealth: Payer: Self-pay | Admitting: Family Medicine

## 2024-02-07 NOTE — Telephone Encounter (Signed)
 Patient was identified as falling into the [place] Measure - Diabetes.   Patient was: Appointment scheduled with primary care provider in the next 30 days.

## 2024-02-20 NOTE — Assessment & Plan Note (Deleted)
-   Last A1c 9.1 in 09/2023 - Home CBGs: *** - Medications: Glargine 10u daily, Lispro 4u BID before meals - Adherence: *** - Eye exam: Advised patient to follow up with ophthalmology  - Foot exam: Performed today - Microalbumin: Performed in 09/2023 - Statin: Lipitor 20 mg daily - No symptoms of hypoglycemia, polyuria, polydipsia, numbness extremities, foot ulcers/trauma

## 2024-02-20 NOTE — Progress Notes (Deleted)
    SUBJECTIVE:   CHIEF COMPLAINT / HPI:   DM   PERTINENT  PMH / PSH: IDA, HTN, T2DM, HLD  OBJECTIVE:   There were no vitals taken for this visit.  General: Awake and Alert in NAD HEENT: NCAT. Sclera anicteric. No rhinorrhea. Cardiovascular: RRR. No M/R/G Respiratory: CTAB, normal WOB on RA. No wheezing, crackles, rhonchi, or diminished breath sounds. Abdomen: Soft, non-tender, non-distended. Bowel sounds normoactive/hypoactive/hyperactive. *** Extremities: Able to move all extremities. No BLE edema, no deformities or significant joint findings. Skin: Warm and dry. No abrasions or rashes noted. Neuro: A&Ox***. No focal neurological deficits.  Diabetic Foot Exam - Simple   No data filed      ASSESSMENT/PLAN:   Assessment & Plan Type 2 diabetes mellitus without complication, with long-term current use of insulin  (HCC) - Last A1c 9.1 in 09/2023 - Home CBGs: *** - Medications: Glargine 10u daily, Lispro 4u BID before meals - Adherence: *** - Eye exam: Advised patient to follow up with ophthalmology  - Foot exam: Performed today - Microalbumin: Performed in 09/2023 - Statin: Lipitor 20 mg daily - No symptoms of hypoglycemia, polyuria, polydipsia, numbness extremities, foot ulcers/trauma     Kathrine Melena, DO [facility]

## 2024-02-21 ENCOUNTER — Ambulatory Visit: Admitting: Family Medicine

## 2024-02-21 DIAGNOSIS — E119 Type 2 diabetes mellitus without complications: Secondary | ICD-10-CM

## 2024-02-28 ENCOUNTER — Encounter: Payer: Self-pay | Admitting: Family Medicine

## 2024-02-28 ENCOUNTER — Ambulatory Visit (INDEPENDENT_AMBULATORY_CARE_PROVIDER_SITE_OTHER): Admitting: Family Medicine

## 2024-02-28 VITALS — BP 131/62 | HR 73 | Ht 67.0 in | Wt 184.6 lb

## 2024-02-28 DIAGNOSIS — Z23 Encounter for immunization: Secondary | ICD-10-CM

## 2024-02-28 DIAGNOSIS — Z794 Long term (current) use of insulin: Secondary | ICD-10-CM

## 2024-02-28 DIAGNOSIS — E119 Type 2 diabetes mellitus without complications: Secondary | ICD-10-CM

## 2024-02-28 DIAGNOSIS — Z1211 Encounter for screening for malignant neoplasm of colon: Secondary | ICD-10-CM

## 2024-02-28 LAB — POCT GLYCOSYLATED HEMOGLOBIN (HGB A1C): HbA1c, POC (controlled diabetic range): 8.4 % — AB (ref 0.0–7.0)

## 2024-02-28 MED ORDER — BASAGLAR KWIKPEN 100 UNIT/ML ~~LOC~~ SOPN: 12.0000 [IU] | PEN_INJECTOR | Freq: Every day | SUBCUTANEOUS | 3 refills | Status: DC

## 2024-02-28 MED ORDER — VALACYCLOVIR HCL 500 MG PO TABS
500.0000 mg | ORAL_TABLET | Freq: Two times a day (BID) | ORAL | 3 refills | Status: AC
Start: 2024-02-28 — End: ?

## 2024-02-28 MED ORDER — FREESTYLE LIBRE 3 PLUS SENSOR MISC: 3 refills | Status: AC

## 2024-02-28 NOTE — Assessment & Plan Note (Addendum)
-   Last A1c 9.1 in 08/2023, 8.4 today - Home CBGs: AM 130-172, PM 210-250 - 7 day avg: 180 - 14 day avg: 191 - 30 day avg: 212 - 90 day avg: 203 - Medications: Glargine 10u daily (taking consistently) and Humalog  4, 2x daily before meals (not taking consistently) - Adherence: Moderate - Medication changes: Increased Glargine to 12 units daily and discontinued Humalog  d/t inconsistency with meals. Provided patient with instructions to increase to 16 units over 2 weeks if sugars remain > 130 fasting - Eye exam: Needs to schedule - Foot exam: Performed today - Microalbumin: Performed 08/2023 - Statin: Lipitor 20 mg daily - No symptoms of hypoglycemia, polyuria, polydipsia, numbness extremities, foot ulcers/trauma  - Follow up in 3 months

## 2024-02-28 NOTE — Progress Notes (Addendum)
    SUBJECTIVE:   CHIEF COMPLAINT / HPI:   DM - Glucose monitor isn't working properly - Sugars are running high at home on her monitor - Trying to moderate her appetite - Doesn't have regular meal times and doesn't take Humalog  consistently and when she does it seem to help and she doesn't feel right  Herpes - Out of Valtrex   PERTINENT  PMH / PSH: IDA, HTN, T2DM, HLD  OBJECTIVE:   BP 131/62   Pulse 73   Ht 5' 7 (1.702 m)   Wt 184 lb 9.6 oz (83.7 kg)   SpO2 100%   BMI 28.91 kg/m   General: Awake and Alert in NAD HEENT: NCAT. Sclera anicteric. No rhinorrhea. Respiratory: Normal WOB on RA. Extremities: Able to move all extremities. No BLE edema, no deformities or significant joint findings. Skin: Warm and dry Neuro: A&Ox3. No focal neurological deficits.  Diabetic Foot Exam - Simple   Simple Foot Form Diabetic Foot exam was performed with the following findings: Yes 02/28/2024  4:39 PM  Visual Inspection No deformities, no ulcerations, no other skin breakdown bilaterally: Yes Sensation Testing Intact to touch and monofilament testing bilaterally: Yes Pulse Check Posterior Tibialis and Dorsalis pulse intact bilaterally: Yes Comments      ASSESSMENT/PLAN:   Assessment & Plan Type 2 diabetes mellitus without complication, with long-term current use of insulin  (HCC) - Last A1c 9.1 in 08/2023, 8.4 today - Home CBGs: AM 130-172, PM 210-250 - 7 day avg: 180 - 14 day avg: 191 - 30 day avg: 212 - 90 day avg: 203 - Medications: Glargine 10u daily (taking consistently) and Humalog  4, 2x daily before meals (not taking consistently) - Adherence: Moderate - Medication changes: Increased Glargine to 12 units daily and discontinued Humalog  d/t inconsistency with meals. Provided patient with instructions to increase to 16 units over 2 weeks if sugars remain > 130 fasting - Eye exam: Needs to schedule - Foot exam: Performed today - Microalbumin: Performed 08/2023 - Statin:  Lipitor 20 mg daily - No symptoms of hypoglycemia, polyuria, polydipsia, numbness extremities, foot ulcers/trauma  - Follow up in 3 months Screening for colon cancer Colonoscopy from 2014 revealed polyps, was advised to return in 2019.  States that she has done a Cologuard since, however due to history of polyps recommended that she get another colonoscopy as this is always recommended with polyps. - Referral to GI for colonoscopy Encounter for immunization COVID and Flu vaccine consented and administered by Rosaline Pesa.   Valtrex  refill provided.  Kathrine Melena, DO [facility]

## 2024-02-28 NOTE — Patient Instructions (Addendum)
 It was great to see you today! Thank you for choosing [facility] for your primary care. Jeanette Ellis was seen for diabetes.  Today we addressed: Diabetes Mellitus  Increasing your Basaglar  to 12 units daily. STOP your Humalog .  Fasting sugars: If your sugar averages between <80-100, go down by 2 units and message us  130-160 increase daily insulin  by 2 units for one week (14 units) If your sugars are still elevated >130 after one week increase by another 2 units (16 units) Target glucose range: 80 -130 (before meals) and <180 1-2 hours after meals Low blood glucose: < 70  Hyperglycemia (high blood sugar) symptoms: increased thirst, frequent urination, fatigue, or blurry vision  Hypoglycemia (low blood sugar) symptoms: headache, shaky, sweating, dizziness/lightheadedness, fatigue, difficulty concentration  Dietary Considerations: Low in carbs, high in protein and fiber. Eating consistent meals is very important.  Try to eat 3 meals a day and snacks in between if needed. Exercise: Regular physical activity can help control your sugars. Walking, running, and lifting weights are all forms of activity to consider. Smoking: Increases the risks of diabetic-related complications. Quitting is the best for your health. Sleep: Try to wake up and sleep at the same time everyday to help your mind and day have a routine.  A1c should be checked about every 3 months until adequate control has been achieved. Goal A1c: < <7%  Valtrex  refill sent for herpes.  Vaccines: COVID and Flu today  You should return to our clinic Return in about 3 months (around 05/30/2024). Please arrive 15 minutes before your appointment to ensure smooth check in process.  We appreciate your efforts in making this happen.  Thank you for allowing me to participate in your care, Kathrine Melena, DO 02/28/2024, 4:33 PM PGY-2, [facility]

## 2024-03-19 NOTE — Progress Notes (Signed)
 Jeanette Ellis                                          MRN: [id number]   03/19/2024   The VBCI Quality Team Specialist reviewed this patient medical record for the purposes of chart review for care gap closure. The following were reviewed: abstraction for care gap closure-glycemic status assessment.    VBCI Quality Team

## 2024-03-23 ENCOUNTER — Encounter

## 2024-03-26 ENCOUNTER — Telehealth: Payer: Self-pay | Admitting: Pharmacist

## 2024-03-26 DIAGNOSIS — E119 Type 2 diabetes mellitus without complications: Secondary | ICD-10-CM

## 2024-03-26 MED ORDER — BASAGLAR KWIKPEN 100 UNIT/ML ~~LOC~~ SOPN: 15.0000 [IU] | PEN_INJECTOR | Freq: Every day | SUBCUTANEOUS | 3 refills | Status: DC

## 2024-03-26 NOTE — Telephone Encounter (Signed)
 Asked to assist with new order for insulin  as dose has recently been increased and now patient is in short supply.  Reports being out of insulin  for two days.   New prescription for Basaglar  (insulin  glargine) 15 units once daily

## 2024-03-26 NOTE — Telephone Encounter (Signed)
 Patient called nurse line regarding Basaglar  prescription.   She reports that she has had to increase her Basaglar  due to increased blood sugar readings. She has been taking 15 units for the last 1.5-2 weeks.   Due to increased dosage, she is out of insulin . This is the second day she has been out of insulin .   Blood sugar levels have been ranging in the 200-300's. She is asymptomatic.   Spoke with Dr. Koval who sent over rx for 15 units Basaglar  daily.   Patient will call back to schedule follow up with Dr. Koval after New Years.   Patient appreciative.   Chiquita JAYSON English, RN

## 2024-04-03 ENCOUNTER — Other Ambulatory Visit: Payer: Self-pay | Admitting: Family Medicine

## 2024-04-03 DIAGNOSIS — I10 Essential (primary) hypertension: Secondary | ICD-10-CM

## 2024-04-03 DIAGNOSIS — E785 Hyperlipidemia, unspecified: Secondary | ICD-10-CM

## 2024-04-30 ENCOUNTER — Encounter: Payer: Self-pay | Admitting: Family Medicine

## 2024-04-30 ENCOUNTER — Ambulatory Visit: Payer: Self-pay | Admitting: Family Medicine

## 2024-04-30 VITALS — BP 130/68 | HR 58 | Ht 67.0 in | Wt 185.2 lb

## 2024-04-30 DIAGNOSIS — Z794 Long term (current) use of insulin: Secondary | ICD-10-CM | POA: Diagnosis not present

## 2024-04-30 DIAGNOSIS — E119 Type 2 diabetes mellitus without complications: Secondary | ICD-10-CM | POA: Diagnosis not present

## 2024-04-30 LAB — POCT GLYCOSYLATED HEMOGLOBIN (HGB A1C): HbA1c, POC (controlled diabetic range): 9.5 % — AB (ref 0.0–7.0)

## 2024-04-30 MED ORDER — BASAGLAR KWIKPEN 100 UNIT/ML ~~LOC~~ SOPN: 16.0000 [IU] | PEN_INJECTOR | Freq: Every day | SUBCUTANEOUS | 3 refills | Status: AC

## 2024-04-30 MED ORDER — DULAGLUTIDE 3 MG/0.5ML ~~LOC~~ SOAJ: 3.0000 mg | SUBCUTANEOUS | 2 refills | Status: DC

## 2024-04-30 MED ORDER — DULAGLUTIDE 3 MG/0.5ML ~~LOC~~ SOAJ: 3.0000 mg | SUBCUTANEOUS | 2 refills | Status: AC

## 2024-04-30 NOTE — Progress Notes (Signed)
" ° °  SUBJECTIVE:   CHIEF COMPLAINT / HPI:  Discussed the use of AI scribe software for clinical note transcription with the patient, who gave verbal consent to proceed.  History of Present Illness Jeanette Ellis is a 74 year old female with diabetes who presents with elevated blood sugar levels and a medication refill request.  Hyperglycemia - Home blood glucose readings consistently in the 200 to 300 mg/dL range - Recent hemoglobin A1c of 9.5% - Fatigue and low energy present - Has been without Trulicity  for 2 to 3 weeks after previously using 3 mg weekly (increased from 1.75 mg about a year ago) - No side effects from Trulicity  - Continues Basaglar  insulin , recently increased dose from 15 to 16 units due to persistent hyperglycemia - Using cinnamon in water in an attempt to lower glucose - Libre 3 continuous glucose monitor losing signal and providing inaccurate low readings despite sensor changes  Medication management - Adequate supply of lisinopril  and atorvastatin  - Requests refill for Trulicity   Preventive care needs - Due for diabetic eye exam - Due for Cologuard colorectal cancer screening    PERTINENT  PMH / PSH:-2 diabetes, hyperlipidemia  OBJECTIVE:  BP 130/68   Pulse (!) 58   Ht 5' 7 (1.702 m)   Wt 185 lb 3.2 oz (84 kg)   SpO2 100%   BMI 29.01 kg/m   General: well appearing, in no acute distress CV: RRR, radial pulses equal and palpable, no BLE edema  Resp: Normal work of breathing on room air, CTAB Neuro: Alert & Oriented  ASSESSMENT/PLAN:   Assessment & Plan Type 2 diabetes mellitus without complication, with long-term current use of insulin  (HCC) Elevated A1c at 9.5% due to missed Trulicity  doses. Discussed potential switch to Mounjaro for better control however on trying to prescribe shows that it would have significantly higher cost of about $700 per month.  Fatigue likely from poor glycemic control. CGM sensor issues noted. - Refilled Trulicity   prescription. - Continue Basaglar  at 16 units at this time. - Will reach out to pharmacy technician to see if Mounjaro has a different price point for patient. - Follow-up with pharmacist Dr. Koval on February 5th at 4 PM for CGM sensor issues. - Patient has ophthalmologist who she will make appointment with for diabetic eye exam  Ordered Cologuard test for patient to screen for colon cancer.    Areta Saliva, MD [facility] "

## 2024-04-30 NOTE — Patient Instructions (Addendum)
 It was wonderful to see you today.  Please bring ALL of your medications with you to every visit.    VISIT SUMMARY: Today, we discussed your elevated blood sugar levels and the need for a medication refill. We also addressed issues with your continuous glucose monitor and reviewed your preventive care needs.  YOUR PLAN: -TYPE 2 DIABETES MELLITUS: Type 2 diabetes is a condition where your body does not use insulin  properly, leading to high blood sugar levels. Your recent A1c level is elevated at 9.5%, likely due to missed doses of Trulicity . We discussed the possibility of switching to Mounjaro for better control, however, with the cost we have refilled your Trulicity  prescription and you should continue taking Basaglar  at 16 units. We also noted issues with your continuous glucose monitor and you will follow up with pharmacist Dr. Koval on February 5th at 4 PM to address these issues.  -COLORECTAL CANCER SCREENING: Colorectal cancer screening is important for detecting cancer early. You are due for this screening, so we have ordered a Cologuard test for you.  INSTRUCTIONS: Please follow up with pharmacist Dr. Koval on February 5th at 4 PM to address the issues with your continuous glucose monitor. Additionally, complete the Cologuard test for colorectal cancer screening as ordered.  Contains text generated by [employer]bridge.   Thank you for choosing [facility].   Please call [phone] with any questions about today's appointment.   Areta Saliva, MD  Family Medicine

## 2024-05-01 NOTE — Assessment & Plan Note (Signed)
 Elevated A1c at 9.5% due to missed Trulicity  doses. Discussed potential switch to Mounjaro for better control however on trying to prescribe shows that it would have significantly higher cost of about $700 per month.  Fatigue likely from poor glycemic control. CGM sensor issues noted. - Refilled Trulicity  prescription. - Continue Basaglar  at 16 units at this time. - Will reach out to pharmacy technician to see if Mounjaro has a different price point for patient. - Follow-up with pharmacist Dr. Koval on February 5th at 4 PM for CGM sensor issues. - Patient has ophthalmologist who she will make appointment with for diabetic eye exam

## 2024-05-14 ENCOUNTER — Ambulatory Visit: Admitting: Pharmacist

## 2024-05-14 ENCOUNTER — Encounter: Payer: Self-pay | Admitting: Pharmacist

## 2024-05-14 VITALS — BP 137/67 | HR 68 | Wt 185.0 lb

## 2024-05-14 DIAGNOSIS — E119 Type 2 diabetes mellitus without complications: Secondary | ICD-10-CM

## 2024-05-14 MED ORDER — MOUNJARO 2.5 MG/0.5ML ~~LOC~~ SOAJ: 2.5000 mg | SUBCUTANEOUS | Status: AC

## 2024-05-14 NOTE — Progress Notes (Signed)
 "   S:     Chief Complaint  Patient presents with   Medication Management    Diabetes follow-up    74 y.o. female who presents for diabetes evaluation, education, and management. Patient arrives in good spirits and presents without  any assistance.   Patient was referred and last seen by Primary Care Provider, Dr. Nicholas, on 04/30/2024.  At last visit, A1C was elevated due to lack of medication adherence due to cost.   PMH is significant for osteoarthritis, HTN, HLD.  Patient reports Diabetes was diagnosed in 2015   Current diabetes medications include: Basaglar  (insulin  glargine) 16 units daily, Humalog  (insulin  lispro) 4-5 units BID prior to meals. Unable to get Trulicity  (dulaglutide ) due to high cost. Counseled patient on cost reduction programs Current hypertension medications include: lisinopril  20 mg daily  Current hyperlipidemia medications include: atorvastatin  20 mg daily.  Patient denies adherence with Trulicity , reports missing medications for weeks due to cost of $600 at her pharmacy.    Patient arrived today and shared glucose reading of 66mg /dl without symptoms of hypoglycemia.  Treated while in office with small serving of peanut butter and glucose recovered to 89 by end of visit.   Patient reported dietary habits: Eats 1-2 meals/day  O:   Review of Systems  All other systems reviewed and are negative.   Physical Exam Vitals reviewed.  Constitutional:      Appearance: Normal appearance.  Pulmonary:     Effort: Pulmonary effort is normal.  Neurological:     Mental Status: She is alert.  Psychiatric:        Mood and Affect: Mood normal.        Behavior: Behavior normal.        Thought Content: Thought content normal.     Libre3 CGM Download today  % Time CGM is active: 96% Average Glucose: 222 mg/dL Glucose Management Indicator: 8.6  Glucose Variability: 34% (goal <36%) Time in Goal:  - Time in range 70-180: 32% - Time above range: 64% - Time  below range: 0% Observed patterns: post-prandial elevations  Lab Results  Component Value Date   HGBA1C 9.5 (A) 04/30/2024   Vitals:   05/14/24 1612  BP: 137/67  Pulse: 68  SpO2: 97%    Lipid Panel     Component Value Date/Time   CHOL 173 09/06/2023 1607   TRIG 187 (H) 09/06/2023 1607   HDL 80 09/06/2023 1607   CHOLHDL 2.2 09/06/2023 1607   LDLCALC 63 09/06/2023 1607    Clinical Atherosclerotic Cardiovascular Disease (ASCVD): No  The 10-year ASCVD risk score (Arnett DK, et al., 2019) is: 36.1%   Values used to calculate the score:     Age: 28 years     Clinically relevant sex: Female     Is Non-Hispanic African American: Yes     Diabetic: Yes     Tobacco smoker: No     Systolic Blood Pressure: 137 mmHg     Is BP treated: Yes     HDL Cholesterol: 80 mg/dL     Total Cholesterol: 173 mg/dL  Lab Results  Component Value Date   CHOL 173 09/06/2023   HDL 80 09/06/2023   LDLCALC 63 09/06/2023   TRIG 187 (H) 09/06/2023   CHOLHDL 2.2 09/06/2023    Lab Results  Component Value Date   CREATININE 0.81 09/06/2023   BUN 16 09/06/2023   NA 141 09/06/2023   K 4.1 09/06/2023   CL 100 09/06/2023   CO2 25  09/06/2023    Medications Reviewed Today     Reviewed by Thomas Mabry G, RPH-CPP (Pharmacist) on 05/14/24 at 1708  Med List Status: <None>   Medication Order Taking? Sig Documenting Provider Last Dose Status Informant  albuterol  (PROVENTIL  HFA;VENTOLIN  HFA) 108 (90 Base) MCG/ACT inhaler [id number] Yes Inhale 2 puffs into the lungs every 4 (four) hours as needed for wheezing or shortness of breath (cough, shortness of breath or wheezing.). Starla Grate D, PA-C  Active            Med Note MAUDIE, MAUDE MATSU   Thu Aug 15, 2023  3:36 PM) PRN - infrequent use over past 1-2 weeks  atorvastatin  (LIPITOR) 20 MG tablet [id number] Yes Take 1 tablet by mouth once daily Baloch, Mahnoor, MD  Active   Continuous Glucose Sensor (FREESTYLE LIBRE 3 PLUS SENSOR) MISC [id number] Yes Change  sensor every 15 days. Gomes, Adriana, DO  Active   Dulaglutide  3 MG/0.5ML EMMANUEL [id number]  Inject 3 mg into the skin once a week.  Patient not taking: Reported on 05/14/2024   Nicholas Bar, MD  Active   fluticasone -salmeterol [facility] INHUB) 250-50 MCG/ACT AEPB [id number]  Inhale 1 puff into the lungs in the morning and at bedtime.  Patient not taking: Reported on 05/14/2024   Desai, Nikita S, MD  Active   Insulin  Glargine (BASAGLAR  [facility]) 100 UNIT/ML [id number] Yes Inject 16 Units into the skin daily. At breakfast Nicholas Bar, MD  Active   insulin  lispro (HUMALOG  KWIKPEN) 100 UNIT/ML KwikPen [id number] Yes Inject 4 Units into the skin 2 (two) times daily before a meal.  Patient taking differently: Inject 5-6 Units into the skin 2 (two) times daily before a meal.   McDiarmid, Krystal BIRCH, MD  Active            Med Note MAUDIE, MAUDE MATSU   Thu Aug 15, 2023  3:38 PM) 4 - 5 units BID  lisinopril  (ZESTRIL ) 20 MG tablet [id number] Yes Take 1 tablet by mouth once daily Baloch, Mahnoor, MD  Active   Multiple Vitamin (MULTIVITAMIN WITH MINERALS) TABS tablet [id number] Yes Take 1 tablet by mouth daily. [provider]  Active   tirzepatide  (MOUNJARO ) 2.5 MG/0.5ML Pen [id number] Yes Inject 2.5 mg into the skin once a week. Rumball, Alison M, DO  Active   valACYclovir  (VALTREX ) 500 MG tablet [id number] Yes Take 1 tablet (500 mg total) by mouth 2 (two) times daily. At the first sign of a herpes outbreak, for 3 days. Janna Ferrier, DO  Active               A/P: Diabetes longstanding since 2015 (11 years) currently worsened control due to decreased adherence with Trulicity  (dulaglutide ) due to cost.  Medication adherence with insulin  appears to be good with variability of 34% her bolus dosing is inadequate.  Control is suboptimal due to lack of GLP effect. - Encouraged patient to contact Medicare Extra Help (Low Income Subsidy) to determine if she may qualify.  Phone number provided.  Plan to contact her in  2 weeks to determine with Trulicity  (dulaglutide ) or Mounjaro  (tirzepatide ) would be acceptable through that plan.   - During determination of LIS - sample of Mounjaro  (tirzepatide ) 2.5mg  weekly was initiated to help with glycemic control in place of Trulicity  (dulaglutide ). - Increase Humalog  (insulin  lispro) from 4 to 6 units BID with large meals  - Reduced Basaglar  (insulin  glargine) from 16 to 14 units once daily - Patient educated on purpose, proper use, and potential  adverse effects.  - Extensively discussed pathophysiology of diabetes, recommended lifestyle interventions, dietary effects on glucose control.  - Counseled on s/sx of and management of hypoglycemia.  - Next A1c anticipated 3 months  Written patient instructions provided. Patient verbalized understanding of treatment plan.  Total time in face to face counseling 28 minutes.    Follow-up:  Pharmacist visit 07/02/2024 PCP clinic visit anticipated May 2026 Patient seen with Sabra Schuller, PharmD Candidate - PY2 student and Fredia Main, PharmD Candidate - PY4 student.    "

## 2024-05-14 NOTE — Assessment & Plan Note (Signed)
 Diabetes longstanding since 2015 (11 years) currently worsened control due to decreased adherence with Trulicity  (dulaglutide ) due to cost.  Medication adherence with insulin  appears to be good with variability of 34% her bolus dosing is inadequate.  Control is suboptimal due to lack of GLP effect. - Encouraged patient to contact Medicare Extra Help (Low Income Subsidy) to determine if she may qualify.  Phone number provided.  Plan to contact her in 2 weeks to determine with Trulicity  (dulaglutide ) or Mounjaro  (tirzepatide ) would be acceptable through that plan.   - During determination of LIS - sample of Mounjaro  (tirzepatide ) 2.5mg  weekly was initiated to help with glycemic control in place of Trulicity  (dulaglutide ). - Increase Humalog  (insulin  lispro) from 4 to 6 units BID with large meals  - Reduced Basaglar  (insulin  glargine) from 16 to 14 units once daily - Patient educated on purpose, proper use, and potential adverse effects.

## 2024-05-14 NOTE — Patient Instructions (Signed)
 It was nice to see you today! LIS medicare extra medicare phone number: [phone], available 24/7.   Your goal glucose value is 80-130 before eating and less than 180 after eating.  Medication Changes: START Mounjaro  (tirzepatide ) 2.5 mg injection once weekly in place of Trulicity  (dulaglutide ) until patient calls and establishes LIS medicare extra medicare to reduce cost.   Increase Humalog  (insulin  lispro) to 6 units into the skin 2 times daily before a meal.  Decrease Basaglar  (insulin  glargine) to 14 units into the skin daily at breakfast.    Continue all other medication the same.   Keep up the good work with diet and exercise. Aim for a diet full of vegetables, fruit and lean meats (chicken, turkey, fish). Try to limit salt intake by eating fresh or frozen vegetables (instead of canned), rinse canned vegetables prior to cooking and do not add any additional salt to meals.   Please bring all medications to your clinic visits.  Please arrive 10-15 minutes prior to your scheduled visit time.

## 2024-05-15 NOTE — Progress Notes (Signed)
 Reviewed and agree with Dr Rennis plan.

## 2024-06-22 ENCOUNTER — Encounter

## 2024-07-02 ENCOUNTER — Ambulatory Visit: Admitting: Pharmacist
